# Patient Record
Sex: Female | Born: 1945
Health system: Southern US, Community
[De-identification: ages and names within clinical notes are randomized; demographics above are authoritative.]

## PROBLEM LIST (undated history)

## (undated) DIAGNOSIS — I10 Essential (primary) hypertension: Secondary | ICD-10-CM

## (undated) DIAGNOSIS — S82899A Other fracture of unspecified lower leg, initial encounter for closed fracture: Secondary | ICD-10-CM

## (undated) DIAGNOSIS — E785 Hyperlipidemia, unspecified: Secondary | ICD-10-CM

## (undated) DIAGNOSIS — E079 Disorder of thyroid, unspecified: Secondary | ICD-10-CM

## (undated) DIAGNOSIS — D219 Benign neoplasm of connective and other soft tissue, unspecified: Secondary | ICD-10-CM

## (undated) DIAGNOSIS — M8580 Other specified disorders of bone density and structure, unspecified site: Secondary | ICD-10-CM

## (undated) DIAGNOSIS — Z8781 Personal history of (healed) traumatic fracture: Secondary | ICD-10-CM

## (undated) DIAGNOSIS — E119 Type 2 diabetes mellitus without complications: Secondary | ICD-10-CM

## (undated) HISTORY — DX: Benign neoplasm of connective and other soft tissue, unspecified: D21.9

## (undated) HISTORY — DX: Other specified disorders of bone density and structure, unspecified site: M85.80

## (undated) HISTORY — PX: TONSILLECTOMY: SHX5217

## (undated) HISTORY — PX: KNEE SURGERY: SHX244

## (undated) HISTORY — DX: Hyperlipidemia, unspecified: E78.5

## (undated) HISTORY — PX: CHOLECYSTECTOMY: SHX55

## (undated) HISTORY — DX: Personal history of (healed) traumatic fracture: Z87.81

## (undated) HISTORY — PX: ABDOMINAL HYSTERECTOMY: SHX81

## (undated) HISTORY — DX: Other fracture of unspecified lower leg, initial encounter for closed fracture: S82.899A

## (undated) HISTORY — DX: Type 2 diabetes mellitus without complications: E11.9

## (undated) HISTORY — PX: BREAST LUMPECTOMY: SHX2

## (undated) HISTORY — PX: ANKLE SURGERY: SHX546

---

## 1985-11-09 HISTORY — PX: BREAST BIOPSY: SHX20

## 1998-05-28 ENCOUNTER — Other Ambulatory Visit: Admission: RE | Admit: 1998-05-28 | Discharge: 1998-05-28 | Payer: Self-pay | Admitting: Family Medicine

## 1998-09-11 ENCOUNTER — Other Ambulatory Visit: Admission: RE | Admit: 1998-09-11 | Discharge: 1998-09-11 | Payer: Self-pay | Admitting: Family Medicine

## 1998-12-12 ENCOUNTER — Other Ambulatory Visit: Admission: RE | Admit: 1998-12-12 | Discharge: 1998-12-12 | Payer: Self-pay | Admitting: Family Medicine

## 1999-04-22 ENCOUNTER — Other Ambulatory Visit: Admission: RE | Admit: 1999-04-22 | Discharge: 1999-04-22 | Payer: Self-pay | Admitting: Family Medicine

## 1999-08-21 ENCOUNTER — Other Ambulatory Visit: Admission: RE | Admit: 1999-08-21 | Discharge: 1999-08-21 | Payer: Self-pay | Admitting: Family Medicine

## 2000-03-24 ENCOUNTER — Other Ambulatory Visit: Admission: RE | Admit: 2000-03-24 | Discharge: 2000-03-24 | Payer: Self-pay | Admitting: Family Medicine

## 2000-04-22 ENCOUNTER — Encounter (INDEPENDENT_AMBULATORY_CARE_PROVIDER_SITE_OTHER): Payer: Self-pay | Admitting: Specialist

## 2000-04-22 ENCOUNTER — Ambulatory Visit (HOSPITAL_COMMUNITY): Admission: RE | Admit: 2000-04-22 | Discharge: 2000-04-22 | Payer: Self-pay | Admitting: Surgery

## 2000-12-22 ENCOUNTER — Other Ambulatory Visit: Admission: RE | Admit: 2000-12-22 | Discharge: 2000-12-22 | Payer: Self-pay | Admitting: Family Medicine

## 2001-10-04 ENCOUNTER — Other Ambulatory Visit: Admission: RE | Admit: 2001-10-04 | Discharge: 2001-10-04 | Payer: Self-pay | Admitting: Family Medicine

## 2002-04-07 ENCOUNTER — Other Ambulatory Visit: Admission: RE | Admit: 2002-04-07 | Discharge: 2002-04-07 | Payer: Self-pay | Admitting: Family Medicine

## 2002-09-28 ENCOUNTER — Other Ambulatory Visit: Admission: RE | Admit: 2002-09-28 | Discharge: 2002-09-28 | Payer: Self-pay | Admitting: Family Medicine

## 2004-11-20 ENCOUNTER — Other Ambulatory Visit: Admission: RE | Admit: 2004-11-20 | Discharge: 2004-11-20 | Payer: Self-pay | Admitting: Family Medicine

## 2005-12-03 ENCOUNTER — Other Ambulatory Visit: Admission: RE | Admit: 2005-12-03 | Discharge: 2005-12-03 | Payer: Self-pay | Admitting: Family Medicine

## 2010-06-03 ENCOUNTER — Encounter: Admission: RE | Admit: 2010-06-03 | Discharge: 2010-08-07 | Payer: Self-pay | Admitting: Family Medicine

## 2011-03-31 ENCOUNTER — Ambulatory Visit: Payer: Medicare Other | Attending: Orthopedic Surgery | Admitting: Physical Therapy

## 2011-03-31 DIAGNOSIS — M25676 Stiffness of unspecified foot, not elsewhere classified: Secondary | ICD-10-CM | POA: Insufficient documentation

## 2011-03-31 DIAGNOSIS — IMO0001 Reserved for inherently not codable concepts without codable children: Secondary | ICD-10-CM | POA: Insufficient documentation

## 2011-03-31 DIAGNOSIS — R262 Difficulty in walking, not elsewhere classified: Secondary | ICD-10-CM | POA: Insufficient documentation

## 2011-03-31 DIAGNOSIS — R5381 Other malaise: Secondary | ICD-10-CM | POA: Insufficient documentation

## 2011-03-31 DIAGNOSIS — M25579 Pain in unspecified ankle and joints of unspecified foot: Secondary | ICD-10-CM | POA: Insufficient documentation

## 2011-03-31 DIAGNOSIS — M25673 Stiffness of unspecified ankle, not elsewhere classified: Secondary | ICD-10-CM | POA: Insufficient documentation

## 2011-04-02 ENCOUNTER — Ambulatory Visit: Payer: Medicare Other | Admitting: Physical Therapy

## 2011-04-07 ENCOUNTER — Encounter: Payer: No Typology Code available for payment source | Admitting: Physical Therapy

## 2011-04-07 ENCOUNTER — Ambulatory Visit: Payer: Medicare Other | Admitting: Physical Therapy

## 2011-04-09 ENCOUNTER — Ambulatory Visit: Payer: Medicare Other | Admitting: Physical Therapy

## 2011-04-13 ENCOUNTER — Ambulatory Visit: Payer: Medicare Other | Attending: Orthopedic Surgery | Admitting: Physical Therapy

## 2011-04-13 DIAGNOSIS — M25673 Stiffness of unspecified ankle, not elsewhere classified: Secondary | ICD-10-CM | POA: Insufficient documentation

## 2011-04-13 DIAGNOSIS — R262 Difficulty in walking, not elsewhere classified: Secondary | ICD-10-CM | POA: Insufficient documentation

## 2011-04-13 DIAGNOSIS — M25676 Stiffness of unspecified foot, not elsewhere classified: Secondary | ICD-10-CM | POA: Insufficient documentation

## 2011-04-13 DIAGNOSIS — M25579 Pain in unspecified ankle and joints of unspecified foot: Secondary | ICD-10-CM | POA: Insufficient documentation

## 2011-04-13 DIAGNOSIS — IMO0001 Reserved for inherently not codable concepts without codable children: Secondary | ICD-10-CM | POA: Insufficient documentation

## 2011-04-13 DIAGNOSIS — R5381 Other malaise: Secondary | ICD-10-CM | POA: Insufficient documentation

## 2011-04-15 ENCOUNTER — Ambulatory Visit: Payer: Medicare Other | Admitting: Physical Therapy

## 2011-04-17 ENCOUNTER — Ambulatory Visit: Payer: Medicare Other | Admitting: Physical Therapy

## 2011-04-20 ENCOUNTER — Ambulatory Visit: Payer: Medicare Other | Admitting: Physical Therapy

## 2011-04-22 ENCOUNTER — Ambulatory Visit: Payer: Medicare Other | Admitting: Physical Therapy

## 2011-04-27 ENCOUNTER — Ambulatory Visit: Payer: Medicare Other | Admitting: Physical Therapy

## 2011-04-29 ENCOUNTER — Encounter: Payer: No Typology Code available for payment source | Admitting: Physical Therapy

## 2011-04-29 ENCOUNTER — Ambulatory Visit: Payer: Medicare Other | Admitting: Physical Therapy

## 2011-05-01 ENCOUNTER — Ambulatory Visit: Payer: Medicare Other | Admitting: Physical Therapy

## 2011-05-04 ENCOUNTER — Ambulatory Visit: Payer: Medicare Other | Admitting: Physical Therapy

## 2011-05-06 ENCOUNTER — Ambulatory Visit: Payer: Medicare Other | Admitting: Physical Therapy

## 2011-05-08 ENCOUNTER — Ambulatory Visit: Payer: Medicare Other | Admitting: Physical Therapy

## 2011-05-18 ENCOUNTER — Ambulatory Visit: Payer: Medicare Other | Attending: Orthopedic Surgery | Admitting: Physical Therapy

## 2011-05-18 DIAGNOSIS — IMO0001 Reserved for inherently not codable concepts without codable children: Secondary | ICD-10-CM | POA: Insufficient documentation

## 2011-05-18 DIAGNOSIS — M25579 Pain in unspecified ankle and joints of unspecified foot: Secondary | ICD-10-CM | POA: Insufficient documentation

## 2011-05-18 DIAGNOSIS — M25673 Stiffness of unspecified ankle, not elsewhere classified: Secondary | ICD-10-CM | POA: Insufficient documentation

## 2011-05-18 DIAGNOSIS — R262 Difficulty in walking, not elsewhere classified: Secondary | ICD-10-CM | POA: Insufficient documentation

## 2011-05-18 DIAGNOSIS — R5381 Other malaise: Secondary | ICD-10-CM | POA: Insufficient documentation

## 2011-05-18 DIAGNOSIS — M25676 Stiffness of unspecified foot, not elsewhere classified: Secondary | ICD-10-CM | POA: Insufficient documentation

## 2011-05-20 ENCOUNTER — Ambulatory Visit: Payer: Medicare Other | Admitting: Physical Therapy

## 2011-05-22 ENCOUNTER — Ambulatory Visit: Payer: Medicare Other | Admitting: Physical Therapy

## 2011-05-25 ENCOUNTER — Ambulatory Visit: Payer: Medicare Other | Admitting: Physical Therapy

## 2011-05-27 ENCOUNTER — Ambulatory Visit: Payer: Medicare Other | Admitting: Physical Therapy

## 2011-05-29 ENCOUNTER — Ambulatory Visit: Payer: Medicare Other | Admitting: Physical Therapy

## 2011-06-01 ENCOUNTER — Ambulatory Visit: Payer: Medicare Other | Admitting: Physical Therapy

## 2011-06-03 ENCOUNTER — Ambulatory Visit: Payer: Medicare Other | Admitting: Physical Therapy

## 2011-06-05 ENCOUNTER — Ambulatory Visit: Payer: Medicare Other | Admitting: *Deleted

## 2011-06-08 ENCOUNTER — Ambulatory Visit: Payer: Medicare Other | Admitting: Physical Therapy

## 2011-06-10 ENCOUNTER — Ambulatory Visit: Payer: Medicare Other | Attending: Orthopedic Surgery | Admitting: Physical Therapy

## 2011-06-10 DIAGNOSIS — M25579 Pain in unspecified ankle and joints of unspecified foot: Secondary | ICD-10-CM | POA: Insufficient documentation

## 2011-06-10 DIAGNOSIS — M25673 Stiffness of unspecified ankle, not elsewhere classified: Secondary | ICD-10-CM | POA: Insufficient documentation

## 2011-06-10 DIAGNOSIS — R262 Difficulty in walking, not elsewhere classified: Secondary | ICD-10-CM | POA: Insufficient documentation

## 2011-06-10 DIAGNOSIS — R5381 Other malaise: Secondary | ICD-10-CM | POA: Insufficient documentation

## 2011-06-10 DIAGNOSIS — M25676 Stiffness of unspecified foot, not elsewhere classified: Secondary | ICD-10-CM | POA: Insufficient documentation

## 2011-06-10 DIAGNOSIS — IMO0001 Reserved for inherently not codable concepts without codable children: Secondary | ICD-10-CM | POA: Insufficient documentation

## 2011-06-12 ENCOUNTER — Ambulatory Visit: Payer: Medicare Other | Admitting: Physical Therapy

## 2011-06-15 ENCOUNTER — Ambulatory Visit: Payer: Medicare Other | Admitting: Physical Therapy

## 2011-06-17 ENCOUNTER — Ambulatory Visit: Payer: Medicare Other | Admitting: Physical Therapy

## 2011-06-19 ENCOUNTER — Ambulatory Visit: Payer: Medicare Other | Admitting: Physical Therapy

## 2011-06-22 ENCOUNTER — Ambulatory Visit: Payer: Medicare Other | Admitting: Physical Therapy

## 2011-06-24 ENCOUNTER — Ambulatory Visit: Payer: Medicare Other | Admitting: Physical Therapy

## 2011-06-26 ENCOUNTER — Ambulatory Visit: Payer: Medicare Other | Admitting: Physical Therapy

## 2011-06-29 ENCOUNTER — Ambulatory Visit: Payer: Medicare Other | Admitting: Physical Therapy

## 2011-07-01 ENCOUNTER — Encounter: Payer: No Typology Code available for payment source | Admitting: Physical Therapy

## 2011-07-03 ENCOUNTER — Ambulatory Visit: Payer: Medicare Other | Admitting: *Deleted

## 2011-07-06 ENCOUNTER — Ambulatory Visit: Payer: Medicare Other | Admitting: Physical Therapy

## 2011-07-09 ENCOUNTER — Ambulatory Visit: Payer: Medicare Other | Admitting: Physical Therapy

## 2011-07-14 ENCOUNTER — Ambulatory Visit: Payer: Medicare Other | Attending: Orthopedic Surgery | Admitting: Physical Therapy

## 2011-07-14 DIAGNOSIS — M25673 Stiffness of unspecified ankle, not elsewhere classified: Secondary | ICD-10-CM | POA: Insufficient documentation

## 2011-07-14 DIAGNOSIS — M25676 Stiffness of unspecified foot, not elsewhere classified: Secondary | ICD-10-CM | POA: Insufficient documentation

## 2011-07-14 DIAGNOSIS — R5381 Other malaise: Secondary | ICD-10-CM | POA: Insufficient documentation

## 2011-07-14 DIAGNOSIS — R262 Difficulty in walking, not elsewhere classified: Secondary | ICD-10-CM | POA: Insufficient documentation

## 2011-07-14 DIAGNOSIS — IMO0001 Reserved for inherently not codable concepts without codable children: Secondary | ICD-10-CM | POA: Insufficient documentation

## 2011-07-14 DIAGNOSIS — M25579 Pain in unspecified ankle and joints of unspecified foot: Secondary | ICD-10-CM | POA: Insufficient documentation

## 2011-07-16 ENCOUNTER — Ambulatory Visit: Payer: Medicare Other | Admitting: Physical Therapy

## 2011-07-20 ENCOUNTER — Ambulatory Visit: Payer: Medicare Other | Admitting: Physical Therapy

## 2012-01-22 ENCOUNTER — Inpatient Hospital Stay (HOSPITAL_COMMUNITY): Payer: Medicare Other | Admitting: Anesthesiology

## 2012-01-22 ENCOUNTER — Emergency Department (HOSPITAL_COMMUNITY): Payer: Medicare Other

## 2012-01-22 ENCOUNTER — Inpatient Hospital Stay (HOSPITAL_COMMUNITY)
Admission: EM | Admit: 2012-01-22 | Discharge: 2012-01-25 | DRG: 494 | Disposition: A | Discharge status: Home Health | Payer: Medicare Other | Attending: Orthopedic Surgery | Admitting: Orthopedic Surgery

## 2012-01-22 ENCOUNTER — Other Ambulatory Visit: Payer: Self-pay

## 2012-01-22 ENCOUNTER — Encounter (HOSPITAL_COMMUNITY)
Admission: EM | Disposition: A | Discharge status: Home Health | Payer: Self-pay | Source: Home / Self Care | Attending: Orthopedic Surgery

## 2012-01-22 ENCOUNTER — Inpatient Hospital Stay (HOSPITAL_COMMUNITY): Payer: Medicare Other

## 2012-01-22 ENCOUNTER — Encounter (HOSPITAL_COMMUNITY): Payer: Self-pay | Admitting: *Deleted

## 2012-01-22 ENCOUNTER — Encounter (HOSPITAL_COMMUNITY): Payer: Self-pay | Admitting: Anesthesiology

## 2012-01-22 DIAGNOSIS — E039 Hypothyroidism, unspecified: Secondary | ICD-10-CM | POA: Diagnosis not present

## 2012-01-22 DIAGNOSIS — I10 Essential (primary) hypertension: Secondary | ICD-10-CM | POA: Diagnosis present

## 2012-01-22 DIAGNOSIS — Y92009 Unspecified place in unspecified non-institutional (private) residence as the place of occurrence of the external cause: Secondary | ICD-10-CM

## 2012-01-22 DIAGNOSIS — IMO0002 Reserved for concepts with insufficient information to code with codable children: Secondary | ICD-10-CM | POA: Diagnosis not present

## 2012-01-22 DIAGNOSIS — S82002A Unspecified fracture of left patella, initial encounter for closed fracture: Secondary | ICD-10-CM

## 2012-01-22 DIAGNOSIS — S82009A Unspecified fracture of unspecified patella, initial encounter for closed fracture: Secondary | ICD-10-CM | POA: Diagnosis not present

## 2012-01-22 DIAGNOSIS — M79609 Pain in unspecified limb: Secondary | ICD-10-CM | POA: Diagnosis not present

## 2012-01-22 DIAGNOSIS — S8990XA Unspecified injury of unspecified lower leg, initial encounter: Secondary | ICD-10-CM | POA: Diagnosis not present

## 2012-01-22 DIAGNOSIS — M7989 Other specified soft tissue disorders: Secondary | ICD-10-CM | POA: Diagnosis not present

## 2012-01-22 DIAGNOSIS — T148XXA Other injury of unspecified body region, initial encounter: Secondary | ICD-10-CM | POA: Diagnosis not present

## 2012-01-22 DIAGNOSIS — Z01811 Encounter for preprocedural respiratory examination: Secondary | ICD-10-CM | POA: Diagnosis not present

## 2012-01-22 DIAGNOSIS — S82009B Unspecified fracture of unspecified patella, initial encounter for open fracture type I or II: Secondary | ICD-10-CM | POA: Diagnosis not present

## 2012-01-22 DIAGNOSIS — W010XXA Fall on same level from slipping, tripping and stumbling without subsequent striking against object, initial encounter: Secondary | ICD-10-CM | POA: Diagnosis present

## 2012-01-22 DIAGNOSIS — M25569 Pain in unspecified knee: Secondary | ICD-10-CM | POA: Diagnosis not present

## 2012-01-22 HISTORY — DX: Disorder of thyroid, unspecified: E07.9

## 2012-01-22 HISTORY — DX: Essential (primary) hypertension: I10

## 2012-01-22 HISTORY — PX: ORIF PATELLA: SHX5033

## 2012-01-22 LAB — BASIC METABOLIC PANEL
Calcium: 9.4 mg/dL (ref 8.4–10.5)
GFR calc non Af Amer: 85 mL/min — ABNORMAL LOW (ref >90)
Potassium: 3.4 mEq/L — ABNORMAL LOW (ref 3.5–5.1)
Sodium: 141 mEq/L (ref 135–145)

## 2012-01-22 LAB — CBC
Hemoglobin: 13.4 g/dL (ref 12.0–15.0)
MCH: 30.5 pg (ref 26.0–34.0)
MCHC: 33.4 g/dL (ref 30.0–36.0)
Platelets: 282 10*3/uL (ref 150–400)
RBC: 4.39 MIL/uL (ref 3.87–5.11)

## 2012-01-22 LAB — MRSA PCR SCREENING: MRSA by PCR: NEGATIVE

## 2012-01-22 SURGERY — OPEN REDUCTION INTERNAL FIXATION (ORIF) PATELLA
Anesthesia: General | Site: Knee | Laterality: Left | Wound class: Clean

## 2012-01-22 MED ORDER — METHOCARBAMOL 500 MG PO TABS
500.0000 mg | ORAL_TABLET | Freq: Four times a day (QID) | ORAL | Status: DC | PRN
  Administered 2012-01-23 – 2012-01-25 (×3): 500 mg via ORAL
  Filled 2012-01-22 (×3): qty 1

## 2012-01-22 MED ORDER — POTASSIUM CHLORIDE IN NACL 20-0.9 MEQ/L-% IV SOLN
INTRAVENOUS | Status: DC
  Administered 2012-01-22: 23:00:00 via INTRAVENOUS
  Filled 2012-01-22 (×8): qty 1000

## 2012-01-22 MED ORDER — METHOCARBAMOL 500 MG PO TABS: 500.0000 mg | ORAL_TABLET | Freq: Four times a day (QID) | ORAL | Status: AC

## 2012-01-22 MED ORDER — METHOCARBAMOL 100 MG/ML IJ SOLN
500.0000 mg | Freq: Four times a day (QID) | INTRAVENOUS | Status: DC | PRN
  Administered 2012-01-23: 500 mg via INTRAVENOUS
  Filled 2012-01-22 (×2): qty 5

## 2012-01-22 MED ORDER — WARFARIN VIDEO
Freq: Once | Status: AC
  Administered 2012-01-23: 12:00:00

## 2012-01-22 MED ORDER — LACTATED RINGERS IV SOLN
INTRAVENOUS | Status: DC | PRN
  Administered 2012-01-22: 20:00:00 via INTRAVENOUS

## 2012-01-22 MED ORDER — IBUPROFEN 200 MG PO TABS
400.0000 mg | ORAL_TABLET | Freq: Once | ORAL | Status: AC
  Administered 2012-01-22: 400 mg via ORAL
  Filled 2012-01-22: qty 2

## 2012-01-22 MED ORDER — OXYCODONE-ACETAMINOPHEN 5-325 MG PO TABS
2.0000 | ORAL_TABLET | Freq: Once | ORAL | Status: AC
  Administered 2012-01-22: 2 via ORAL
  Filled 2012-01-22: qty 2

## 2012-01-22 MED ORDER — FENTANYL CITRATE 0.05 MG/ML IJ SOLN
INTRAMUSCULAR | Status: DC | PRN
  Administered 2012-01-22 (×3): 50 ug via INTRAVENOUS
  Administered 2012-01-22: 100 ug via INTRAVENOUS

## 2012-01-22 MED ORDER — KCL IN DEXTROSE-NACL 20-5-0.9 MEQ/L-%-% IV SOLN
INTRAVENOUS | Status: DC
  Filled 2012-01-22 (×3): qty 1000

## 2012-01-22 MED ORDER — VITAMIN D3 25 MCG (1000 UNIT) PO TABS
2000.0000 [IU] | ORAL_TABLET | Freq: Every morning | ORAL | Status: DC
  Administered 2012-01-23 – 2012-01-25 (×3): 2000 [IU] via ORAL
  Filled 2012-01-22 (×3): qty 2

## 2012-01-22 MED ORDER — ONDANSETRON HCL 4 MG/2ML IJ SOLN
INTRAMUSCULAR | Status: DC | PRN
  Administered 2012-01-22: 4 mg via INTRAVENOUS

## 2012-01-22 MED ORDER — LACTATED RINGERS IV SOLN: INTRAVENOUS | Status: DC

## 2012-01-22 MED ORDER — MIDAZOLAM HCL 5 MG/5ML IJ SOLN
INTRAMUSCULAR | Status: DC | PRN
  Administered 2012-01-22: 2 mg via INTRAVENOUS

## 2012-01-22 MED ORDER — CEFAZOLIN SODIUM-DEXTROSE 2-3 GM-% IV SOLR
2.0000 g | INTRAVENOUS | Status: AC
  Administered 2012-01-22: 2 g via INTRAVENOUS

## 2012-01-22 MED ORDER — OXYCODONE HCL 5 MG PO TABS
5.0000 mg | ORAL_TABLET | ORAL | Status: DC | PRN
  Administered 2012-01-23 (×2): 10 mg via ORAL
  Administered 2012-01-23: 5 mg via ORAL
  Administered 2012-01-24 – 2012-01-25 (×5): 10 mg via ORAL
  Filled 2012-01-22 (×8): qty 2

## 2012-01-22 MED ORDER — OXYCODONE-ACETAMINOPHEN 5-325 MG PO TABS: 1.0000 | ORAL_TABLET | ORAL | Status: DC | PRN

## 2012-01-22 MED ORDER — HYDROMORPHONE HCL PF 1 MG/ML IJ SOLN
0.2500 mg | INTRAMUSCULAR | Status: DC | PRN
  Administered 2012-01-22 – 2012-01-23 (×5): 0.5 mg via INTRAVENOUS
  Filled 2012-01-22 (×3): qty 1

## 2012-01-22 MED ORDER — ACETAMINOPHEN 10 MG/ML IV SOLN
INTRAVENOUS | Status: DC | PRN
  Administered 2012-01-22: 1000 mg via INTRAVENOUS

## 2012-01-22 MED ORDER — CEFAZOLIN SODIUM 1-5 GM-% IV SOLN
INTRAVENOUS | Status: AC
  Filled 2012-01-22: qty 100

## 2012-01-22 MED ORDER — METOCLOPRAMIDE HCL 5 MG/ML IJ SOLN: 5.0000 mg | Freq: Three times a day (TID) | INTRAMUSCULAR | Status: DC | PRN

## 2012-01-22 MED ORDER — MORPHINE SULFATE 2 MG/ML IJ SOLN
1.0000 mg | INTRAMUSCULAR | Status: DC | PRN
  Administered 2012-01-23: 1 mg via INTRAVENOUS
  Filled 2012-01-22: qty 1

## 2012-01-22 MED ORDER — WARFARIN - PHARMACIST DOSING INPATIENT: Freq: Every day | Status: DC

## 2012-01-22 MED ORDER — LEVOTHYROXINE SODIUM 125 MCG PO TABS
125.0000 ug | ORAL_TABLET | Freq: Every day | ORAL | Status: DC
  Administered 2012-01-23 – 2012-01-25 (×3): 125 ug via ORAL
  Filled 2012-01-22 (×3): qty 1

## 2012-01-22 MED ORDER — POTASSIUM CHLORIDE IN NACL 20-0.9 MEQ/L-% IV SOLN
INTRAVENOUS | Status: AC
  Filled 2012-01-22: qty 1000

## 2012-01-22 MED ORDER — OXYCODONE-ACETAMINOPHEN 5-325 MG PO TABS: 1.0000 | ORAL_TABLET | ORAL | Status: AC | PRN

## 2012-01-22 MED ORDER — ASPIRIN 325 MG PO TABS
325.0000 mg | ORAL_TABLET | Freq: Every day | ORAL | Status: DC
  Administered 2012-01-23 – 2012-01-25 (×3): 325 mg via ORAL
  Filled 2012-01-22 (×3): qty 1

## 2012-01-22 MED ORDER — WARFARIN SODIUM 5 MG PO TABS: 5.0000 mg | ORAL_TABLET | Freq: Every day | ORAL | Status: AC

## 2012-01-22 MED ORDER — ACETAMINOPHEN 10 MG/ML IV SOLN
INTRAVENOUS | Status: AC
  Filled 2012-01-22: qty 100

## 2012-01-22 MED ORDER — HYDROCHLOROTHIAZIDE 25 MG PO TABS
25.0000 mg | ORAL_TABLET | Freq: Every day | ORAL | Status: DC
Start: 2012-01-23 — End: 2012-01-25
  Administered 2012-01-23 – 2012-01-25 (×3): 25 mg via ORAL
  Filled 2012-01-22 (×3): qty 1

## 2012-01-22 MED ORDER — LIDOCAINE HCL (CARDIAC) 20 MG/ML IV SOLN
INTRAVENOUS | Status: DC | PRN
  Administered 2012-01-22: 75 mg via INTRAVENOUS

## 2012-01-22 MED ORDER — ONDANSETRON HCL 4 MG PO TABS
4.0000 mg | ORAL_TABLET | Freq: Four times a day (QID) | ORAL | Status: DC | PRN
  Administered 2012-01-25: 4 mg via ORAL
  Filled 2012-01-22: qty 1

## 2012-01-22 MED ORDER — COUMADIN BOOK
Freq: Once | Status: AC
  Administered 2012-01-23: 14:00:00
  Filled 2012-01-22 (×2): qty 1

## 2012-01-22 MED ORDER — HYDROMORPHONE HCL PF 1 MG/ML IJ SOLN
INTRAMUSCULAR | Status: AC
  Administered 2012-01-23: 0.5 mg via INTRAVENOUS
  Filled 2012-01-22: qty 1

## 2012-01-22 MED ORDER — PROPOFOL 10 MG/ML IV BOLUS
INTRAVENOUS | Status: DC | PRN
  Administered 2012-01-22: 200 mg via INTRAVENOUS

## 2012-01-22 MED ORDER — METOCLOPRAMIDE HCL 10 MG PO TABS: 5.0000 mg | ORAL_TABLET | Freq: Three times a day (TID) | ORAL | Status: DC | PRN

## 2012-01-22 MED ORDER — CEFAZOLIN SODIUM 1-5 GM-% IV SOLN
1.0000 g | Freq: Three times a day (TID) | INTRAVENOUS | Status: AC
  Administered 2012-01-23 (×3): 1 g via INTRAVENOUS
  Filled 2012-01-22 (×4): qty 50

## 2012-01-22 MED ORDER — ONDANSETRON HCL 4 MG/2ML IJ SOLN: 4.0000 mg | Freq: Four times a day (QID) | INTRAMUSCULAR | Status: DC | PRN

## 2012-01-22 MED ORDER — DEXAMETHASONE SODIUM PHOSPHATE 10 MG/ML IJ SOLN
INTRAMUSCULAR | Status: DC | PRN
  Administered 2012-01-22: 10 mg via INTRAVENOUS

## 2012-01-22 SURGICAL SUPPLY — 48 items
1.25MM GUIDE PIN ×3 IMPLANT
BANDAGE ELASTIC 4 VELCRO ST LF (GAUZE/BANDAGES/DRESSINGS) ×2 IMPLANT
BANDAGE ELASTIC 6 VELCRO ST LF (GAUZE/BANDAGES/DRESSINGS) ×2 IMPLANT
BIT DRILL CANN 2.7X625 NONSTRL (BIT) ×1 IMPLANT
BLADE SURG ROTATE 9660 (MISCELLANEOUS) ×2 IMPLANT
CLOTH BEACON ORANGE TIMEOUT ST (SAFETY) ×2 IMPLANT
COVER SURGICAL LIGHT HANDLE (MISCELLANEOUS) ×2 IMPLANT
CUFF TOURN SGL QUICK 34 (TOURNIQUET CUFF) ×2
CUFF TOURNIQUET SINGLE 44IN (TOURNIQUET CUFF) IMPLANT
CUFF TRNQT CYL 34X4X40X1 (TOURNIQUET CUFF) IMPLANT
DECANTER SPIKE VIAL GLASS SM (MISCELLANEOUS) IMPLANT
DRAPE C-ARM 42X72 X-RAY (DRAPES) ×2 IMPLANT
DRSG ADAPTIC 3X8 NADH LF (GAUZE/BANDAGES/DRESSINGS) ×2 IMPLANT
DRSG EMULSION OIL 3X3 NADH (GAUZE/BANDAGES/DRESSINGS) ×2 IMPLANT
DRSG PAD ABDOMINAL 8X10 ST (GAUZE/BANDAGES/DRESSINGS) ×2 IMPLANT
ELECT REM PT RETURN 9FT ADLT (ELECTROSURGICAL) ×2
ELECTRODE REM PT RTRN 9FT ADLT (ELECTROSURGICAL) ×1 IMPLANT
GAUZE XEROFORM 1X8 LF (GAUZE/BANDAGES/DRESSINGS) ×1 IMPLANT
GLOVE BIO SURGEON STRL SZ8 (GLOVE) ×4 IMPLANT
GLOVE BIOGEL PI IND STRL 8 (GLOVE) ×1 IMPLANT
GLOVE BIOGEL PI INDICATOR 8 (GLOVE) ×1
GOWN PREVENTION PLUS LG XLONG (DISPOSABLE) IMPLANT
GOWN PREVENTION PLUS XLARGE (GOWN DISPOSABLE) ×4 IMPLANT
IMMOBILIZER KNEE 22 UNIV (SOFTGOODS) ×1 IMPLANT
KIT BASIN OR (CUSTOM PROCEDURE TRAY) ×2 IMPLANT
MANIFOLD NEPTUNE II (INSTRUMENTS) ×2 IMPLANT
NEEDLE 22X1 1/2 (OR ONLY) (NEEDLE) IMPLANT
NS IRRIG 1000ML POUR BTL (IV SOLUTION) ×2 IMPLANT
PACK LOWER EXTREMITY WL (CUSTOM PROCEDURE TRAY) ×1 IMPLANT
PAD CAST 4YDX4 CTTN HI CHSV (CAST SUPPLIES) ×2 IMPLANT
PADDING CAST COTTON 4X4 STRL (CAST SUPPLIES) ×4
PADDING CAST COTTON 6X4 STRL (CAST SUPPLIES) ×1 IMPLANT
PASSER SUT SWANSON 36MM LOOP (INSTRUMENTS) ×1 IMPLANT
SCREW CANN P.T. 40MM (Screw) ×2 IMPLANT
SPONGE GAUZE 4X4 12PLY (GAUZE/BANDAGES/DRESSINGS) ×1 IMPLANT
SPONGE LAP 4X18 X RAY DECT (DISPOSABLE) ×4 IMPLANT
STAPLER VISISTAT 35W (STAPLE) ×2 IMPLANT
SUCTION FRAZIER TIP 10 FR DISP (SUCTIONS) ×2 IMPLANT
SUT VIC AB 0 CT1 36 (SUTURE) ×1 IMPLANT
SUT VIC AB 2-0 CT1 27 (SUTURE) ×10
SUT VIC AB 2-0 CT1 TAPERPNT 27 (SUTURE) ×2 IMPLANT
SUT WIRE 4-0 14IN SS (SUTURE) ×1 IMPLANT
SYR CONTROL 10ML LL (SYRINGE) IMPLANT
TOWEL OR 17X26 10 PK STRL BLUE (TOWEL DISPOSABLE) ×2 IMPLANT
TUBE CONNECTING 12X1/4 (SUCTIONS) ×2 IMPLANT
UNDERPAD 30X30 INCONTINENT (UNDERPADS AND DIAPERS) ×2 IMPLANT
WATER STERILE IRR 1000ML POUR (IV SOLUTION) ×2 IMPLANT
YANKAUER SUCT BULB TIP NO VENT (SUCTIONS) IMPLANT

## 2012-01-22 NOTE — Anesthesia Preprocedure Evaluation (Addendum)
Anesthesia Evaluation  Patient identified by MRN, date of birth, ID band Patient awake    Reviewed: Allergy & Precautions, H&P , NPO status , Patient's Chart, lab work & pertinent test results  Airway Mallampati: II TM Distance: >3 FB Neck ROM: full    Dental  (+) Chipped and Dental Advisory Given,    Pulmonary neg pulmonary ROS,  breath sounds clear to auscultation  Pulmonary exam normal       Cardiovascular Exercise Tolerance: Good hypertension, Pt. on medications Rhythm:regular Rate:Normal     Neuro/Psych negative neurological ROS  negative psych ROS   GI/Hepatic negative GI ROS, Neg liver ROS,   Endo/Other  negative endocrine ROSHypothyroidism   Renal/GU negative Renal ROS  negative genitourinary   Musculoskeletal   Abdominal   Peds  Hematology negative hematology ROS (+)   Anesthesia Other Findings   Reproductive/Obstetrics negative OB ROS                         Anesthesia Physical Anesthesia Plan  ASA: II  Anesthesia Plan: General   Post-op Pain Management:    Induction: Intravenous  Airway Management Planned: LMA  Additional Equipment:   Intra-op Plan:   Post-operative Plan:   Informed Consent: I have reviewed the patients History and Physical, chart, labs and discussed the procedure including the risks, benefits and alternatives for the proposed anesthesia with the patient or authorized representative who has indicated his/her understanding and acceptance.   Dental Advisory Given  Plan Discussed with: CRNA and Surgeon  Anesthesia Plan Comments:         Anesthesia Quick Evaluation

## 2012-01-22 NOTE — Plan of Care (Signed)
Problem: Diagnosis - Type of Surgery Goal: General Surgical Patient Education (See Patient Education module for education specifics) Outcome: Progressing orif left patella

## 2012-01-22 NOTE — Anesthesia Postprocedure Evaluation (Signed)
  Anesthesia Post-op Note  Patient: Vanessa Hubbard  Procedure(s) Performed: Procedure(s) (LRB): OPEN REDUCTION INTERNAL (ORIF) FIXATION PATELLA (Left)  Patient Location: PACU  Anesthesia Type: General  Level of Consciousness: awake and alert   Airway and Oxygen Therapy: Patient Spontanous Breathing  Post-op Pain: mild  Post-op Assessment: Post-op Vital signs reviewed, Patient's Cardiovascular Status Stable, Respiratory Function Stable, Patent Airway and No signs of Nausea or vomiting  Post-op Vital Signs: stable  Complications: No apparent anesthesia complications

## 2012-01-22 NOTE — Progress Notes (Addendum)
ANTICOAGULATION CONSULT NOTE - Initial Consult  Pharmacy Consult for Warfarin Indication: VTE prophylaxis s/p ORIF fractured patella  No Known Allergies  Patient Measurements: Height: 5\' 3"  (160 cm) Weight: 173 lb (78.472 kg) IBW/kg (Calculated) : 52.4   Vital Signs: Temp: 98.3 F (36.8 C) (03/15 1946) Temp src: Oral (03/15 1946) BP: 135/78 mmHg (03/15 1946) Pulse Rate: 81  (03/15 1946)  Labs:  Basename 01/22/12 1827  HGB 13.4  HCT 40.1  PLT 282  APTT --  LABPROT --  INR --  HEPARINUNFRC --  CREATININE 0.78  CKTOTAL --  CKMB --  TROPONINI --   Estimated Creatinine Clearance: 67.7 ml/min (by C-G formula based on Cr of 0.78).  Medical History: Past Medical History  Diagnosis Date  . Hypertension   . Thyroid disease     Medications:  Scheduled:    .  ceFAZolin (ANCEF) IV  2 g Intravenous 60 min Pre-Op  . ibuprofen  400 mg Oral Once  . oxyCODONE-acetaminophen  2 tablet Oral Once    Assessment:  66 yo F admit with fractured left patella due to falling  s/p ORIF fractured patella 3/15, notes indicate hemodynamically stable post-op  Baseline coags pending  Goal of Therapy:  INR 2-3   Plan:   Warfarin dosing per pharmacy  Daily INR, CBC  Warfarin education, book and video   Lynann Beaver PharmD, BCPS Pager [phone] 01/22/2012 10:23 PM  ADDENDUM : Baseline INR drawn @ 00:05 on 01/23/12 = 1.01  Plan : Coumadin 5mg  po x 1 tonight.  Terrilee Files, PharmD 01/23/12 00:58

## 2012-01-22 NOTE — H&P (Signed)
Vanessa Hubbard is an 66 y.o. female.   Chief Complaint: left knee pain HPI: Vanessa Hubbard is a 66 year old female with left knee pain. Patient fell directly onto the left knee today. She reports inability to weight-bear and has weakness with leg extension. Notably the patient has had left ankle fracture fixed a year ago and right patella fracture fixed 17 years ago. The patient denies any other orthopedic complaints or loss of consciousness from the time of the accident. There is no history of pulmonary embolism or deep vein thrombosis in the family.  Past Medical History  Diagnosis Date  . Hypertension   . Thyroid disease     Past Surgical History  Procedure Date  . Ankle surgery   . Abdominal hysterectomy     History reviewed. No pertinent family history. Social History:  reports that she has never smoked. She does not have any smokeless tobacco history on file. She reports that she does not drink alcohol or use illicit drugs.  Allergies: No Known Allergies  Medications Prior to Admission  Medication Dose Route Frequency Provider Last Rate Last Dose  . ceFAZolin (ANCEF) IVPB 2 g/50 mL premix  2 g Intravenous 60 min Pre-Op Cammy Copa, MD      . dextrose 5 % and 0.9 % NaCl with KCl 20 mEq/L infusion   Intravenous Continuous Cammy Copa, MD      . ibuprofen (ADVIL,MOTRIN) tablet 400 mg  400 mg Oral Once Raeford Razor, MD   400 mg at 01/22/12 1524  . oxyCODONE-acetaminophen (PERCOCET) 5-325 MG per tablet 1-2 tablet  1-2 tablet Oral Q4H PRN Cammy Copa, MD      . oxyCODONE-acetaminophen [facility]) 5-325 MG per tablet 2 tablet  2 tablet Oral Once Raeford Razor, MD   2 tablet at 01/22/12 1523   No current outpatient prescriptions on file as of 01/22/2012.    Results for orders placed during the hospital encounter of 01/22/12 (from the past 48 hour(s))  CBC     Status: Abnormal   Collection Time   01/22/12  6:27 PM      Component Value Range Comment   WBC 11.7 (*) 4.0 -  10.5 (K/uL)    RBC 4.39  3.87 - 5.11 (MIL/uL)    Hemoglobin 13.4  12.0 - 15.0 (g/dL)    HCT 96.0  45.4 - 09.8 (%)    MCV 91.3  78.0 - 100.0 (fL)    MCH 30.5  26.0 - 34.0 (pg)    MCHC 33.4  30.0 - 36.0 (g/dL)    RDW 11.9  14.7 - 82.9 (%)    Platelets 282  150 - 400 (K/uL)    Dg Chest 2 View  01/22/2012  *RADIOLOGY REPORT*  Clinical Data: Preoperative respiratory exam.  Patella fracture.  CHEST - 2 VIEW  Comparison: None.  Findings: The heart size and vascularity are normal and the lungs are clear.  No osseous abnormality.  IMPRESSION: Normal chest.  Original Report Authenticated By: Gwynn Burly, M.D.   Dg Knee Complete 4 Views Left  01/22/2012  *RADIOLOGY REPORT*  Clinical Data: Recent fall with knee pain  LEFT KNEE - COMPLETE 4+ VIEW  Comparison: None.  Findings:  There is a transverse fracture through the mid patella with distraction of the superior fragment cephalad by approximately 14 mm.  There is a joint effusion present.  Some loss of the medial joint space is noted. There is soft tissue swelling superficially anteriorly.  IMPRESSION: Transverse distracted fracture  of the mid patella with small effusion.  Original Report Authenticated By: Juline Patch, M.D.    Review of Systems  Constitutional: Negative.   HENT: Negative.   Eyes: Negative.   Respiratory: Negative.   Cardiovascular: Negative.   Gastrointestinal: Negative.   Musculoskeletal: Positive for joint pain.  Skin: Negative.   Neurological: Negative.   Endo/Heme/Allergies: Negative.   Psychiatric/Behavioral: Negative.     Blood pressure 122/73, pulse 83, temperature 97.9 F (36.6 C), temperature source Oral, resp. rate 16, height 5\' 3"  (1.6 m), weight 78.472 kg (173 lb), SpO2 98.00%. Physical Exam  Constitutional: She appears well-developed and well-nourished.  HENT:  Head: Normocephalic.  Eyes: Conjunctivae and EOM are normal. Pupils are equal, round, and reactive to light.  Neck: Normal range of motion. Neck  supple.  Cardiovascular: Normal rate.   Respiratory: Effort normal.  GI: Soft.   examination of the left knee demonstrates swelling but no skin abrasion. Extensor mechanism is not intact. Pedal pulses are palpable. Compartments are soft.  Assessment/Plan Impression is three-part fracture of the left patella displaced. Plan is for open reduction internal fixation of the patella. Risk and benefits are discussed with the patient including but not limited to infection nerve vessel damage incomplete healing development of arthritis in the patellofemoral joint all questions are answered this could be difficult fracture due to the fragmentation as well as do to her bone quality which may be of questionable quality. Medical decision making his coffee about decision for surgery all questions answered and indicating that on Alida Greiner  [facility] SCOTT 01/22/2012, 7:02 PM

## 2012-01-22 NOTE — ED Notes (Signed)
Pt states she was states she was in the kitchen and turn around on the her rug. Pt states she trip fell. Pt denies hitting her knee on anything. Pt denies any loc. Pt is have right knee pain. Knee noted to swollen and painful to touch

## 2012-01-22 NOTE — Op Note (Signed)
01/22/2012  10:06 PM  PATIENT:  Vanessa Hubbard  66 y.o. female  PRE-OPERATIVE DIAGNOSIS:  fractured left patella  POST-OPERATIVE DIAGNOSIS:  fractured patella left   PROCEDURE:  Procedure(s): OPEN REDUCTION INTERNAL (ORIF) FIXATION PATELLA  SURGEON:  Surgeon(s): Cammy Copa, MD  ASSISTANT: none  ANESTHESIA:   general  EBL: 50 ml       BLOOD ADMINISTERED: none  DRAINS: none   LOCAL MEDICATIONS USED:  none  SPECIMEN:  No Specimen  COUNTS:  YES  TOURNIQUET:   Total Tourniquet Time Documented: Thigh (Left) - 154 minutes  DICTATION: .Other Dictation: Dictation Number [id number] PLAN OF CARE: Admit to inpatient   PATIENT DISPOSITION:  PACU - hemodynamically stable

## 2012-01-22 NOTE — Transfer of Care (Signed)
Immediate Anesthesia Transfer of Care Note  Patient: Vanessa Hubbard  Procedure(s) Performed: Procedure(s) (LRB): OPEN REDUCTION INTERNAL (ORIF) FIXATION PATELLA (Left)  Patient Location: PACU  Anesthesia Type: General  Level of Consciousness: awake, oriented and patient cooperative  Airway & Oxygen Therapy: Patient Spontanous Breathing and Patient connected to face mask oxygen  Post-op Assessment: Report given to PACU RN and Post -op Vital signs reviewed and stable  Post vital signs: Reviewed and stable  Complications: No apparent anesthesia complications

## 2012-01-22 NOTE — Discharge Instructions (Signed)
Patellar Fracture, Adult A patellar fracture is a break in the little round bone (kneecap) that is the bump on the front of your knee. A direct blow to the knee, or fall, is usually the cause of a broken patella. Sometimes, a very hard and strong bending of the knee (like jumping events in sports) can cause a fracture. Usually the knee is tender and swollen and has pain with motion, especially trying to straighten out the leg. There may be difficulty walking or putting weight on the affected side. These fractures generally heal in about 4 to 6 weeks. DIAGNOSIS  The diagnosis is usually easily made with an exam and x-ray. TREATMENT  Treatment is dependent on the type of fracture:  If the fracture is undisplaced or only slightly displaced (this means the position of the parts is good) then, after any blood in the joint has been removed, and if you can still straighten your leg out, you can usually be treated with a splint or cast for 4 to 6 weeks. Straightening out your leg is called extension and the ability to do this is with the extensor mechanism. Every day while in treatment quadriceps exercises should be performed, or as directed by your caregivers.   If there is a stellate (comminuted) fracture of the patella (this means the patella is in multiple small pieces), the blood in the joint may be removed and if you are able to straighten your leg, you can usually be treated with a splint or cast for 4 to 6 weeks. Sometimes this type of fracture may be treated by removing the patella. You will still have a good knee without a patella. If this is done the knee still will need to be in a plaster cast or splint for the next 4 to 6 weeks.   If the fracture is a displaced transverse fracture and you cannot extend (straighten out your leg), then an operation is required to hold the bony fragments together until they heal. Again a plaster cast or splint is worn until the extension mechanism of the knee is  regained. This means the knee is healed and you can straighten out your leg again normally.   Only take over-the-counter or prescription medicines for pain, discomfort, or fever as directed by your caregiver.   Warning: Do not drive a car or operate a motor vehicle until your caregiver specifically tells you it is safe to do so.  HOME CARE INSTRUCTIONS   You may resume normal diet and activities as directed or allowed.   Keep ice packs (a bag of ice wrapped in a towel) on the knee for twenty minutes, four times per day, for the first two days.   Change dressings if necessary or as directed.   Only take over-the-counter or prescription medicines for pain, discomfort, or fever as directed by your caregiver.   Use crutches as directed and exercise leg as directed.   Keep appointments as directed.  SEEK IMMEDIATE MEDICAL CARE IF :  Redness, swelling, or increasing pain in the knee.   An unexplained oral temperature above 102 F (38.9 C) develops.  Document Released: 07/25/2003 Document Revised: 10/15/2011 Document Reviewed: 06/08/2008 [facility] Patient Information 2012 [place], [place].Patellar Fracture, Adult A patellar fracture is a break in the little round bone (kneecap) that is the bump on the front of your knee. A direct blow to the knee, or fall, is usually the cause of a broken patella. Sometimes, a very hard and strong bending of the  knee (like jumping events in sports) can cause a fracture. Usually the knee is tender and swollen and has pain with motion, especially trying to straighten out the leg. There may be difficulty walking or putting weight on the affected side. These fractures generally heal in about 4 to 6 weeks. DIAGNOSIS  The diagnosis is usually easily made with an exam and x-ray. TREATMENT  Treatment is dependent on the type of fracture:  If the fracture is undisplaced or only slightly displaced (this means the position of the parts is good) then, after any blood in  the joint has been removed, and if you can still straighten your leg out, you can usually be treated with a splint or cast for 4 to 6 weeks. Straightening out your leg is called extension and the ability to do this is with the extensor mechanism. Every day while in treatment quadriceps exercises should be performed, or as directed by your caregivers.   If there is a stellate (comminuted) fracture of the patella (this means the patella is in multiple small pieces), the blood in the joint may be removed and if you are able to straighten your leg, you can usually be treated with a splint or cast for 4 to 6 weeks. Sometimes this type of fracture may be treated by removing the patella. You will still have a good knee without a patella. If this is done the knee still will need to be in a plaster cast or splint for the next 4 to 6 weeks.   If the fracture is a displaced transverse fracture and you cannot extend (straighten out your leg), then an operation is required to hold the bony fragments together until they heal. Again a plaster cast or splint is worn until the extension mechanism of the knee is regained. This means the knee is healed and you can straighten out your leg again normally.   Only take over-the-counter or prescription medicines for pain, discomfort, or fever as directed by your caregiver.   Warning: Do not drive a car or operate a motor vehicle until your caregiver specifically tells you it is safe to do so.  HOME CARE INSTRUCTIONS   You may resume normal diet and activities as directed or allowed.   Keep ice packs (a bag of ice wrapped in a towel) on the knee for twenty minutes, four times per day, for the first two days.   Change dressings if necessary or as directed.   Only take over-the-counter or prescription medicines for pain, discomfort, or fever as directed by your caregiver.   Use crutches as directed and exercise leg as directed.   Keep appointments as directed.  SEEK  IMMEDIATE MEDICAL CARE IF :  Redness, swelling, or increasing pain in the knee.   An unexplained oral temperature above 102 F (38.9 C) develops.  Document Released: 07/25/2003 Document Revised: 10/15/2011 Document Reviewed: 06/08/2008 [facility] Patient Information 2012 [place], [place].

## 2012-01-22 NOTE — ED Provider Notes (Signed)
History    66 year old female with left knee pain. Patient mechanical fall in her kitchen and fell in her left knee. Said persistent knee pain since then. This accident happened just before arrival. Patient has not tried any leg since. Denies any acute numbness, tingling or loss of strength. She did not strike her head. Denies any headaches, neck or back pain. Patient denies use of blood thinning medication.  CSN: [id number]  Arrival date & time 01/22/12  1415   First MD Initiated Contact with Patient 01/22/12 1439      Chief Complaint  Patient presents with  . Fall  . Knee Pain    (Consider location/radiation/quality/duration/timing/severity/associated sxs/prior treatment) HPI  Past Medical History  Diagnosis Date  . Hypertension     Past Surgical History  Procedure Date  . Ankle surgery   . Abdominal hysterectomy     No family history on file.  History  Substance Use Topics  . Smoking status: Never Smoker   . Smokeless tobacco: Not on file  . Alcohol Use: No    OB History    Grav Para Term Preterm Abortions TAB SAB Ect Mult Living                  Review of Systems   Review of symptoms negative unless otherwise noted in HPI.   Allergies  Review of patient's allergies indicates not on file.  Home Medications  No current outpatient prescriptions on file.  BP 142/66  Pulse 90  Temp(Src) 98.8 F (37.1 C) (Oral)  Resp 18  Ht 5\' 3"  (1.6 m)  Wt 173 lb (78.472 kg)  BMI 30.65 kg/m2  SpO2 98%  Physical Exam  Nursing note and vitals reviewed. Constitutional: She appears well-developed and well-nourished. No distress.  HENT:  Head: Normocephalic and atraumatic.  Eyes: Conjunctivae are normal. Right eye exhibits no discharge. Left eye exhibits no discharge.  Neck: Neck supple.  Cardiovascular: Normal rate, regular rhythm and normal heart sounds.  Exam reveals no gallop and no friction rub.   No murmur heard. Pulmonary/Chest: Effort normal and breath  sounds normal. No respiratory distress.  Abdominal: Soft. She exhibits no distension. There is no tenderness.  Musculoskeletal:       Left knee with a large effusion. Diffuse tenderness. No ligamentous laxity appreciated but exam somewhat limited due to large effusion and patient's comfort. Overlying skin is intact. Neurovascularly intact distally.  Skin: Skin is warm and dry.  Psychiatric: She has a normal mood and affect. Her behavior is normal. Thought content normal.    ED Course  Procedures (including critical care time)  Labs Reviewed - No data to display Dg Knee Complete 4 Views Left  01/22/2012  *RADIOLOGY REPORT*  Clinical Data: Recent fall with knee pain  LEFT KNEE - COMPLETE 4+ VIEW  Comparison: None.  Findings:  There is a transverse fracture through the mid patella with distraction of the superior fragment cephalad by approximately 14 mm.  There is a joint effusion present.  Some loss of the medial joint space is noted. There is soft tissue swelling superficially anteriorly.  IMPRESSION: Transverse distracted fracture of the mid patella with small effusion.  Original Report Authenticated By: Juline Patch, M.D.     1. Fracture of patella, left, closed       MDM  66 year old female with left knee pain after a fall. This is mechanical fall and no history to suggest syncope. X-rays are significant for a transverse fracture of her patella with  some distraction. This is a closed injury. No evidence of neurovascular injury. Patient was placed in the knee immobilizer. Orthopedics was consulted. To OR tonight.        Raeford Razor, MD 01/22/12 [phone]

## 2012-01-23 LAB — CBC
MCH: 30.6 pg (ref 26.0–34.0)
MCV: 91.6 fL (ref 78.0–100.0)
Platelets: 285 10*3/uL (ref 150–400)
RDW: 13.2 % (ref 11.5–15.5)

## 2012-01-23 MED ORDER — METHOCARBAMOL 500 MG PO TABS: 500.0000 mg | ORAL_TABLET | Freq: Four times a day (QID) | ORAL | Status: DC | PRN

## 2012-01-23 MED ORDER — WARFARIN SODIUM 5 MG PO TABS
5.0000 mg | ORAL_TABLET | Freq: Once | ORAL | Status: AC
  Administered 2012-01-23: 5 mg via ORAL
  Filled 2012-01-23: qty 1

## 2012-01-23 NOTE — Progress Notes (Signed)
Subjective: 1 Day Post-Op Procedure(s) (LRB): OPEN REDUCTION INTERNAL (ORIF) FIXATION PATELLA (Left) Patient reports pain as moderate.    Objective: Vital signs in last 24 hours: Temp:  [97.8 F (36.6 C)-98.8 F (37.1 C)] 98.4 F (36.9 C) (03/16 0500) Pulse Rate:  [74-105] 78  (03/16 0500) Resp:  [10-18] 16  (03/16 0500) BP: (114-153)/(63-88) 114/69 mmHg (03/16 0500) SpO2:  [94 %-99 %] 95 % (03/16 0500) Weight:  [78.472 kg (173 lb)] 78.472 kg (173 lb) (03/15 1433)  Intake/Output from previous day: 03/15 0701 - 03/16 0700 In: 1195 [P.O.:240; I.V.:850; IV Piggyback:105] Out: 1150 [Urine:1100; Blood:50] Intake/Output this shift: Total I/O In: 646.3 [I.V.:646.3] Out: -    Basename 01/23/12 0422 01/22/12 1827  HGB 13.5 13.4    Basename 01/23/12 0422 01/22/12 1827  WBC 7.4 11.7*  RBC 4.41 4.39  HCT 40.4 40.1  PLT 285 282    Basename 01/22/12 1827  NA 141  K 3.4*  CL 102  CO2 28  BUN 12  CREATININE 0.78  GLUCOSE 120*  CALCIUM 9.4    Basename 01/23/12 0005  LABPT --  INR 1.01    Neurovascular intact Sensation intact distally Intact pulses distally Incision: dressing C/D/I Compartment soft  Assessment/Plan: 1 Day Post-Op Procedure(s) (LRB): OPEN REDUCTION INTERNAL (ORIF) FIXATION PATELLA (Left) Up with therapy WBAT left leg, but now knee motion Coumadin for DVT proph.  Ibn Stief Y 01/23/2012, 8:38 AM

## 2012-01-23 NOTE — Progress Notes (Signed)
Physical Therapy Treatment Patient Details Name: Vanessa Hubbard MRN: [id number] DOB: [birth year] Today's Date: 01/23/2012  PT Assessment/Plan  PT - Assessment/Plan Comments on Treatment Session: Marked improvement in activity tolerance.  Pt pleased with ability to [facility] PT Plan: Discharge plan remains appropriate PT Frequency: 7X/week Follow Up Recommendations: Home health PT Equipment Recommended: None recommended by PT PT Goals  Acute Rehab PT Goals PT Goal Formulation: With patient Time For Goal Achievement: 7 days Pt will go Supine/Side to Sit: with supervision PT Goal: Supine/Side to Sit - Progress: Goal set today Pt will go Sit to Supine/Side: with supervision PT Goal: Sit to Supine/Side - Progress: Goal set today Pt will go Sit to Stand: with supervision PT Goal: Sit to Stand - Progress: Goal set today Pt will go Stand to Sit: with supervision PT Goal: Stand to Sit - Progress: Goal set today Pt will Ambulate: 51 - 150 feet;with supervision;with rolling walker PT Goal: Ambulate - Progress: Goal set today Pt will Go Up / Down Stairs: 1-2 stairs;with min assist;with rolling walker PT Goal: Up/Down Stairs - Progress: Goal set today  PT Treatment Precautions/Restrictions  Precautions Precautions: Fall Required Braces or Orthoses: Yes Knee Immobilizer: On at all times Restrictions Weight Bearing Restrictions: No LLE Weight Bearing: Weight bearing as tolerated Other Position/Activity Restrictions: NO ROM L KNEE Mobility (including Balance) Bed Mobility Sit to Supine: 4: Min assist Sit to Supine - Details (indicate cue type and reason): VC for sequence - min assist with L LE Transfers Sit to Stand: 4: Min assist Sit to Stand Details (indicate cue type and reason): cues for use of UEs and for LE management Stand to Sit: 4: Min assist Stand to Sit Details: cues for use of UEs and for LE management Ambulation/Gait Ambulation/Gait Assistance: 4: Min assist Ambulation/Gait  Assistance Details (indicate cue type and reason): cues for posture and position from RW Ambulation Distance (Feet): 110 Feet (110 + 25') Assistive device: Rolling walker Gait Pattern: Step-to pattern Stairs: No    Exercise    End of Session PT - End of Session Equipment Utilized During Treatment: Left knee immobilizer Activity Tolerance: Patient tolerated treatment well Patient left: in bed;with call bell in reach;with family/visitor present Nurse Communication: Mobility status for transfers;Mobility status for ambulation General Behavior During Session: [facility] for tasks performed Cognition: [facility] for tasks performed  Melesio Madara 01/23/2012, 4:31 PM

## 2012-01-23 NOTE — Progress Notes (Signed)
ANTICOAGULATION CONSULT NOTE - Initial Consult  Pharmacy Consult for Warfarin Indication: VTE prophylaxis s/p ORIF fractured patella  No Known Allergies  Patient Measurements: Height: 5\' 3"  (160 cm) Weight: 173 lb (78.472 kg) IBW/kg (Calculated) : 52.4   Vital Signs: Temp: 97.5 F (36.4 C) (03/16 1400) BP: 112/71 mmHg (03/16 1400) Pulse Rate: 82  (03/16 1400)  Labs:  Basename 01/23/12 0422 01/23/12 0005 01/22/12 1827  HGB 13.5 -- 13.4  HCT 40.4 -- 40.1  PLT 285 -- 282  APTT -- -- --  LABPROT -- 13.5 --  INR -- 1.01 --  HEPARINUNFRC -- -- --  CREATININE -- -- 0.78  CKTOTAL -- -- --  CKMB -- -- --  TROPONINI -- -- --   Estimated Creatinine Clearance: 67.7 ml/min (by C-G formula based on Cr of 0.78).  Medical History: Past Medical History  Diagnosis Date  . Hypertension   . Thyroid disease     Medications:  Scheduled:     . 0.9 % NaCl with KCl 20 mEq / L      . aspirin  325 mg Oral Daily  .  ceFAZolin (ANCEF) IV  1 g Intravenous Q8H  .  ceFAZolin (ANCEF) IV  2 g Intravenous 60 min Pre-Op  . cholecalciferol  2,000 Units Oral q morning - 10a  . coumadin book   Does not apply Once  . hydrochlorothiazide  25 mg Oral Daily  . levothyroxine  125 mcg Oral QAC breakfast  . warfarin  5 mg Oral Once  . warfarin   Does not apply Once  . Warfarin - Pharmacist Dosing Inpatient   Does not apply q1800    Assessment:  66 yo F admit with fractured left patella due to falling  s/p ORIF fractured patella 3/15, notes indicate hemodynamically stable post-op  Coumadin started 3/16 at 0100  CBC stable today  Baseline INR 1.01   Goal of Therapy:  INR 2-3   Plan:  1.) Repeat coumadin 5 mg po x 1 at 1800 2.) Daily PT/INR 3.) Coumadin book/video/education  Keyona Emrich, Loma Messing PharmD 4:42 PM 01/23/2012

## 2012-01-23 NOTE — Evaluation (Signed)
Physical Therapy Evaluation Patient Details Name: Vanessa Hubbard MRN: [id number] DOB: [birth year] Today's Date: 01/23/2012  Problem List: There is no problem list on file for this patient.   Past Medical History:  Past Medical History  Diagnosis Date  . Hypertension   . Thyroid disease    Past Surgical History:  Past Surgical History  Procedure Date  . Ankle surgery   . Abdominal hysterectomy     PT Assessment/Plan/Recommendation PT Assessment Clinical Impression Statement: Pt with L patella fx presents with decreased L LE strength/ROM  and limited functional mobiltiy. PT Recommendation/Assessment: Patient will need skilled PT in the acute care venue PT Problem List: Decreased strength;Decreased range of motion;Decreased activity tolerance;Decreased mobility;Decreased knowledge of use of DME;Pain PT Therapy Diagnosis : Difficulty walking PT Plan PT Frequency: 7X/week PT Treatment/Interventions: DME instruction;Gait training;Stair training;Functional mobility training;Therapeutic activities;Therapeutic exercise;Patient/family education PT Recommendation Recommendations for Other Services: OT consult Follow Up Recommendations: Home health PT Equipment Recommended: None recommended by PT (pt states she can borrow RW) PT Goals  Acute Rehab PT Goals PT Goal Formulation: With patient Time For Goal Achievement: 7 days Pt will go Supine/Side to Sit: with supervision PT Goal: Supine/Side to Sit - Progress: Goal set today Pt will go Sit to Supine/Side: with supervision PT Goal: Sit to Supine/Side - Progress: Goal set today Pt will go Sit to Stand: with supervision PT Goal: Sit to Stand - Progress: Goal set today Pt will go Stand to Sit: with supervision PT Goal: Stand to Sit - Progress: Goal set today Pt will Ambulate: 51 - 150 feet;with supervision;with rolling walker PT Goal: Ambulate - Progress: Goal set today Pt will Go Up / Down Stairs: 1-2 stairs;with min assist;with  rolling walker PT Goal: Up/Down Stairs - Progress: Goal set today  PT Evaluation Precautions/Restrictions  Precautions Precautions: Fall Required Braces or Orthoses: Yes Knee Immobilizer: On at all times Restrictions Weight Bearing Restrictions: No LLE Weight Bearing: Weight bearing as tolerated Other Position/Activity Restrictions: NO ROM L KNEE Prior Functioning  Home Living Lives With: Spouse Receives Help From: Family Type of Home: House Home Layout: One level Home Access: Stairs to enter Entrance Stairs-Rails: None Entrance Stairs-Number of Steps: 2 (1+1) Home Adaptive Equipment: Walker - rolling;Walker - four wheeled Prior Function Level of Independence: Independent with basic ADLs;Independent with gait;Independent with transfers Able to Take Stairs?: Yes Cognition Cognition Arousal/Alertness: Awake/alert Overall Cognitive Status: Appears within functional limits for tasks assessed Orientation Level: Oriented X4 Sensation/Coordination Coordination Gross Motor Movements are Fluid and Coordinated: Yes Extremity Assessment RUE Assessment RUE Assessment: Within Functional Limits LUE Assessment LUE Assessment: Within Functional Limits RLE Assessment RLE Assessment: Within Functional Limits LLE Assessment LLE Assessment: Exceptions to [facility] (KI in place at all times - NO ROM at knee) Mobility (including Balance) Bed Mobility Bed Mobility: Yes Supine to Sit: 4: Min assist Supine to Sit Details (indicate cue type and reason): cues for sequence and to self assist with UEs; min assist for L LE Transfers Transfers: Yes Sit to Stand: 4: Min assist Sit to Stand Details (indicate cue type and reason): cues for use of UEs and for LE management Stand to Sit: 4: Min assist Stand to Sit Details: cues for use of UEs and for LE management Ambulation/Gait Ambulation/Gait: Yes Ambulation/Gait Assistance: 4: Min assist;3: Mod assist Ambulation/Gait Assistance Details (indicate  cue type and reason): cues for sequence, position from RW and posture Ambulation Distance (Feet): 20 Feet Assistive device: Rolling walker Gait Pattern: Step-to pattern    Exercise  End of Session PT - End of Session Activity Tolerance: Patient tolerated treatment well Patient left: in chair;with call bell in reach;with family/visitor present Nurse Communication: Mobility status for transfers;Mobility status for ambulation General Behavior During Session: [facility] for tasks performed Cognition: [facility] for tasks performed  Johanna Matto 01/23/2012, 1:19 PM

## 2012-01-23 NOTE — Op Note (Signed)
NAME:  Vanessa Hubbard, Vanessa Hubbard NO.:  [id number]  MEDICAL RECORD NO.:  [id number]  LOCATION:  1601                         FACILITY:  [facility]  PHYSICIAN:  Burnard Bunting, M.D.    DATE OF BIRTH:  1946-06-14  DATE OF PROCEDURE: DATE OF DISCHARGE:                              OPERATIVE REPORT   PREOPERATIVE DIAGNOSIS:  Left patella fracture.  POSTOPERATIVE DIAGNOSIS:  Left patella fracture.  PROCEDURE:  Open reduction and internal fixation of left patellar fracture.  SURGEON:  Burnard Bunting, M.D.  ASSISTANT:  None.  ANESTHESIA:  General endotracheal.  BLOOD LOSS:  50 mL.  DRAINS:  None.  TOURNIQUET TIME:  32 minutes at 300 mmHg.  INDICATIONS:  Vanessa Hubbard is a patient, 66 year old, with left patellar fracture and displaced, presents for operative management after explanation of risks and benefits.  PROCEDURE IN DETAIL:  The patient brought to the operating room where general endotracheal anesthesia induced.  Preoperative antibiotics administered.  Left leg was prescribed with alcohol and Betadine, which allowed to air dry, prepped with DuraPrep solution and draped in sterile manner.  The time-out was called.  Operative field was covered with Ioban.  Leg was elevated and exsanguinated with Esmarch wrap. Tourniquet was inflated.  Anterior approach to the knee was made.  Skin and subcutaneous tissue sharply divided.  Soft tissue of the patella was divided.  The fracture was identified.  Thorough irrigation of the joint was performed, 2 pins near the fracture was then reduced under fluoroscopic guidance and two 3 size screws were placed across the fracture site.  Distal proximal figure-of-8 FiberWire was then placed to reinforce the repair.  The reduced fracture was held in place with a reduction tenaculum and a good repair was achieved.  Tourniquet was released.  Thorough irrigation was performed.  Incision was closed using interrupted inverted 0 Vicryl suture,  2-0 Vicryl suture and skin staples.  Bulky dressing was applied.  The patient tolerated the procedure well without immediate complication.     Burnard Bunting, M.D.     GSD/MEDQ  D:  01/22/2012  T:  01/23/2012  Job:  [id number]

## 2012-01-24 LAB — PROTIME-INR: Prothrombin Time: 16.4 seconds — ABNORMAL HIGH (ref 11.6–15.2)

## 2012-01-24 LAB — CBC
MCH: 30.3 pg (ref 26.0–34.0)
MCHC: 32.6 g/dL (ref 30.0–36.0)
MCV: 93 fL (ref 78.0–100.0)
Platelets: 266 10*3/uL (ref 150–400)
RDW: 13.6 % (ref 11.5–15.5)

## 2012-01-24 MED ORDER — WARFARIN SODIUM 5 MG PO TABS
5.0000 mg | ORAL_TABLET | Freq: Once | ORAL | Status: AC
  Administered 2012-01-24: 5 mg via ORAL
  Filled 2012-01-24: qty 1

## 2012-01-24 NOTE — Progress Notes (Signed)
CARE MANAGEMENT NOTE 01/24/2012  Patient:  Vanessa Hubbard, Vanessa Hubbard   Account Number:  [id number]  Date Initiated:  01/24/2012  Documentation initiated by:  Diante Barley  Subjective/Objective Assessment:   66 yo female admitted s/p left open reduction internal fixation of the patella. PTA pt lived with spouse.     Action/Plan:   Home when stable   Anticipated DC Date:  01/25/2012   Anticipated DC Plan:  HOME W HOME HEALTH SERVICES  In-house referral  NA      DC Planning Services  CM consult      [facility] Choice  HOME HEALTH   Choice offered to / List presented to:  C-1 Patient   DME arranged  NA      DME agency  NA     HH arranged  HH-2 PT      HH agency  [facility]   Status of service:  In process, will continue to follow Medicare Important Message given?   (If response is "NO", the following Medicare IM given date fields will be blank) Date Medicare IM given:   Date Additional Medicare IM given:    Discharge Disposition:    Per UR Regulation:    If discussed at Long Length of Stay Meetings, dates discussed:    Comments:  01/24/12 1058 Vanessa Condie,Rn,BSN [phone] Cm spoke with pt concerning d/c planning. Per pt choice [facility] to provide [facility] services. [facility] rep Talmadge Coventry notified of pt referral. Pt states spouse will assistin home care. pt's husband to provide Rw and elevated tube seat from rental equipment company. No other HH services or needs requested. Awaiting MD orders.

## 2012-01-24 NOTE — Progress Notes (Signed)
Physical Therapy Treatment Patient Details Name: Vanessa Hubbard MRN: [id number] DOB: [birth year] Today's Date: 01/24/2012  PT Assessment/Plan  PT - Assessment/Plan Comments on Treatment Session: Increased activity today with stair education and home exercise education completed. Decreased assistance needed with mobility today. PT Plan: Discharge plan remains appropriate;Frequency remains appropriate PT Frequency: 7X/week Follow Up Recommendations: Home health PT Equipment Recommended: None recommended by PT PT Goals  Acute Rehab PT Goals PT Goal: Supine/Side to Sit - Progress: Progressing toward goal PT Goal: Sit to Stand - Progress: Met PT Goal: Stand to Sit - Progress: Met PT Goal: Ambulate - Progress: Progressing toward goal PT Goal: Up/Down Stairs - Progress: Met  PT Treatment Precautions/Restrictions  Precautions Precautions: Fall Required Braces or Orthoses: Yes Knee Immobilizer: On at all times Restrictions Weight Bearing Restrictions: No LLE Weight Bearing: Weight bearing as tolerated Other Position/Activity Restrictions: NO ROM L KNEE Mobility (including Balance) Bed Mobility Supine to Sit: 4: Min assist;HOB flat Supine to Sit Details (indicate cue type and reason): assist for left LE movement to edge of bed and support while pt elevated trunk herself and scooted to edge of bed. cues for technque/hand placement to make transitions easier. (pt= 85-90%). Transfers Sit to Stand: 5: Supervision;From bed;From chair/3-in-1 Sit to Stand Details (indicate cue type and reason): cues for hand placement with standing from bed. demo'd safe technique with standing from [facility] over toilet. Stand to Sit: 5: Supervision;To chair/3-in-1 Stand to Sit Details: demo'd safe technique with sitting to [facility] over the toilet and to the recliner. Ambulation/Gait Ambulation/Gait Assistance: 5: Supervision Ambulation/Gait Assistance Details (indicate cue type and reason): occasional cues for  posture with gait.  Ambulation Distance (Feet): 70 Feet Assistive device: Rolling walker Gait Pattern: Step-to pattern;Decreased stance time - left;Decreased step length - right;Antalgic Stairs: Yes Stairs Assistance: 4: Min assist Stairs Assistance Details (indicate cue type and reason): cues for technique and sequency with going up with strong leg first and down with surgical leg first. support for walker only needed. Stair Management Technique: With walker;Backwards Number of Stairs: 1   Posture/Postural Control Posture/Postural Control: No significant limitations Balance Balance Assessed: Yes Dynamic Standing Balance Dynamic Standing - Balance Support: During functional activity (alternated between no UE support and Unilateral UE support  ) Dynamic Standing - Level of Assistance: 5: Stand by assistance Dynamic Standing - Comments: pt stood at sink to wash hands (including reaching for soap, papertowels and turning to throw items in trash) all without UE support. Pt also stood at sink to brush teeth with alternating UE support on the sink.  Exercise  Total Joint Exercises Ankle Circles/Pumps: AROM;Both;10 reps;Supine Quad Sets: AROM;Both;Strengthening;10 reps;Supine (isometric contraction with KI on the left leg) Hip ABduction/ADduction: AAROM;Left;10 reps;Supine (with KI on) Straight Leg Raises: AAROM;Left;10 reps;Supine (with KI on) End of Session PT - End of Session Equipment Utilized During Treatment: Gait belt;Left knee immobilizer Activity Tolerance: Patient tolerated treatment well Patient left: in chair;with call bell in reach Nurse Communication: Mobility status for transfers;Mobility status for ambulation;Weight bearing status General Behavior During Session: [facility] for tasks performed Cognition: [facility] for tasks performed  Sallyanne Kuster 01/24/2012, 10:27 AM  Sallyanne Kuster, PTA Office- [phone] Pager- [phone]

## 2012-01-24 NOTE — Progress Notes (Signed)
ANTICOAGULATION CONSULT NOTE - Initial Consult  Pharmacy Consult for Warfarin Indication: VTE prophylaxis s/p ORIF fractured patella  No Known Allergies  Patient Measurements: Height: 5\' 3"  (160 cm) Weight: 173 lb (78.472 kg) IBW/kg (Calculated) : 52.4   Vital Signs: Temp: 97.8 F (36.6 C) (03/17 0452) Temp src: Oral (03/17 0452) BP: 118/74 mmHg (03/17 0452) Pulse Rate: 82  (03/17 0452)  Labs:  Basename 01/24/12 0444 01/23/12 0422 01/23/12 0005 01/22/12 1827  HGB 11.3* 13.5 -- --  HCT 34.4* 40.4 -- 40.1  PLT 266 285 -- 282  APTT -- -- -- --  LABPROT 16.4* -- 13.5 --  INR 1.30 -- 1.01 --  HEPARINUNFRC -- -- -- --  CREATININE -- -- -- 0.78  CKTOTAL -- -- -- --  CKMB -- -- -- --  TROPONINI -- -- -- --   Estimated Creatinine Clearance: 67.7 ml/min (by C-G formula based on Cr of 0.78).  Medical History: Past Medical History  Diagnosis Date  . Hypertension   . Thyroid disease     Medications:  Scheduled:     . 0.9 % NaCl with KCl 20 mEq / L      . aspirin  325 mg Oral Daily  .  ceFAZolin (ANCEF) IV  1 g Intravenous Q8H  . cholecalciferol  2,000 Units Oral q morning - 10a  . coumadin book   Does not apply Once  . hydrochlorothiazide  25 mg Oral Daily  . levothyroxine  125 mcg Oral QAC breakfast  . warfarin  5 mg Oral ONCE-1800  . warfarin   Does not apply Once  . Warfarin - Pharmacist Dosing Inpatient   Does not apply q1800    Assessment:  66 yo F admit with fractured left patella due to falling, s/p ORIF fractured patella 3/15  Coumadin initiation in process (started 3/16 at 0100) with INR moving toward goal.   H/H decreased but stable  No bleeding reported/documented   Goal of Therapy:  INR 2-3   Plan:  1.) Repeat coumadin 5 mg po x 1 at 1800 2.) Daily PT/INR 3.) Coumadin book/video/education  Vanessa Hubbard, Loma Messing PharmD 9:41 AM 01/24/2012

## 2012-01-24 NOTE — Plan of Care (Signed)
Problem: Phase II Progression Outcomes Goal: Foley discontinued Outcome: Not Applicable Date Met:  01/24/12 No foley

## 2012-01-24 NOTE — Progress Notes (Signed)
Patient ID: Vanessa Hubbard, female   DOB: 10/09/1946, 66 y.o.   MRN: [id number] Much improved mobility in PT with yesterday afternoon session.  Pain better controlled.  Calf soft. NVI. Dressing changes as well.  Plan: D/C home tomorrow with HHPT, coumadin

## 2012-01-25 LAB — CBC
MCH: 30.1 pg (ref 26.0–34.0)
MCHC: 32.2 g/dL (ref 30.0–36.0)
MCV: 93.2 fL (ref 78.0–100.0)
Platelets: 252 10*3/uL (ref 150–400)
RBC: 3.96 MIL/uL (ref 3.87–5.11)
RDW: 13.6 % (ref 11.5–15.5)

## 2012-01-25 NOTE — Progress Notes (Signed)
Physical Therapy Treatment Patient Details Name: Vanessa Hubbard MRN: [id number] DOB: [birth year] Today's Date: 01/25/2012  L Patella FW with ORIF WBAT, KI all times, NO KNEE ROM 10:05 - 10:45 1 gt  1 ta  1 hm  PT Assessment/Plan  PT - Assessment/Plan Comments on Treatment Session: Instructed pt/spouse on KI use (all times), proper application and caution with removal for hygiene, instructed on NO knee ROM, given copy of X Ray and educated on ORIF, instructed spouse on how to assist pt in/out bed, bathroom, up/down one step and some HEP.  Pt plans to D/C to home today. PT Plan: Discharge plan remains appropriate Follow Up Recommendations: Home health PT Equipment Recommended: Other (comment) (Pt/spouse would poss like a 3:1 but want to know $ first) PT Goals  Acute Rehab PT Goals PT Goal Formulation: With patient Pt will go Supine/Side to Sit: with supervision PT Goal: Supine/Side to Sit - Progress: Met Pt will go Sit to Supine/Side: with supervision PT Goal: Sit to Supine/Side - Progress: Met Pt will go Sit to Stand: with supervision PT Goal: Sit to Stand - Progress: Met Pt will go Stand to Sit: with supervision PT Goal: Stand to Sit - Progress: Met Pt will Ambulate: 51 - 150 feet;with supervision;with rolling walker PT Goal: Ambulate - Progress: Met Pt will Go Up / Down Stairs: 1-2 stairs;with min assist;with rolling walker PT Goal: Up/Down Stairs - Progress: Met  PT Treatment Precautions/Restrictions  Precautions Precautions: Fall Required Braces or Orthoses: Yes Knee Immobilizer: On at all times Restrictions Weight Bearing Restrictions: No LLE Weight Bearing: Weight bearing as tolerated Other Position/Activity Restrictions: NO ROM L KNEE Mobility (including Balance) Bed Mobility Bed Mobility: Yes Supine to Sit: 5: Supervision Supine to Sit Details (indicate cue type and reason): increased time and instructed spouse how to support L LE off bed  Sit to Supine: 5:  Supervision Sit to Supine - Details (indicate cue type and reason): ncreased time and instructed spouse how to support L LE off bed  Transfers Transfers: Yes Sit to Stand: 6: Modified independent (Device/Increase time);From bed;From toilet Sit to Stand Details (indicate cue type and reason): good use of hands and safety tech Stand to Sit: 6: Modified independent (Device/Increase time);To bed;To toilet Stand to Sit Details: good use of hands and safety tech Ambulation/Gait Ambulation/Gait: Yes Ambulation/Gait Assistance: 5: Supervision Ambulation/Gait Assistance Details (indicate cue type and reason): with KI and spouse with instructions on safety with turns and backward gait sequencing. Ambulation Distance (Feet): 85 Feet Assistive device: Rolling walker Gait Pattern: Step-to pattern Gait velocity: increased time and no c/o PAIN, JUST "SORENESS". Stairs: Yes Stairs Assistance: Other (comment) (MinGuard Assist) Stairs Assistance Details (indicate cue type and reason): with spouse with instructions on proper sequencing and safety using RW. Stair Management Technique: No rails;Forwards Number of Stairs: 1  (1 x 1 up forward/down forward) Wheelchair Mobility Wheelchair Mobility: No    Exercise    End of Session PT - End of Session Equipment Utilized During Treatment: Gait belt;Left knee immobilizer Activity Tolerance: Patient tolerated treatment well Patient left: in bed;with call bell in reach;with family/visitor present General Behavior During Session: [facility] for tasks performed Cognition: [facility] for tasks performed  Felecia Shelling  PTA [facility]  Acute  Rehab Pager     [phone]

## 2012-01-25 NOTE — Progress Notes (Signed)
Pt stable vss dressing ok - mobilizing well - for dc home today

## 2012-01-25 NOTE — Progress Notes (Signed)
CARE MANAGEMENT NOTE 01/25/2012  Patient:  Vanessa Hubbard, Vanessa Hubbard   Account Number:  [id number]  Date Initiated:  01/24/2012  Documentation initiated by:  DAVIS,TYMEEKA  Subjective/Objective Assessment:   66 yo female admitted s/p left open reduction internal fixation of the patella. PTA pt lived with spouse.     Action/Plan:   Home when stable   Anticipated DC Date:  01/25/2012   Anticipated DC Plan:  HOME W HOME HEALTH SERVICES  In-house referral  NA      DC Planning Services  CM consult      [facility] Choice  HOME HEALTH   Choice offered to / List presented to:  C-1 Patient   DME arranged  3-N-1      DME agency  [facility]     HH arranged  HH-2 PT  HH-1 RN      [facility] agency  [facility]   Status of service:  Completed, signed off Medicare Important Message given?  NA - LOS <3 / Initial given by admissions (If response is "NO", the following Medicare IM given date fields will be blank) Comments:  01/25/2012 Raynelle Bring BSN CCM [phone] Pt will require [facility] for coumading management with blood drws in addition to HHpt.  Pt is also requesting 3N1-DME. [facility] rep notified of additional need for HHR and DME.  3n1delivered to patient's room. HH services to start tomorrow 01/26/2012. Pt discharging today.  List of choice HH agencies on shadow chart.

## 2012-01-26 DIAGNOSIS — I10 Essential (primary) hypertension: Secondary | ICD-10-CM | POA: Diagnosis not present

## 2012-01-26 DIAGNOSIS — Z5181 Encounter for therapeutic drug level monitoring: Secondary | ICD-10-CM | POA: Diagnosis not present

## 2012-01-26 DIAGNOSIS — Z7901 Long term (current) use of anticoagulants: Secondary | ICD-10-CM | POA: Diagnosis not present

## 2012-01-26 DIAGNOSIS — IMO0001 Reserved for inherently not codable concepts without codable children: Secondary | ICD-10-CM | POA: Diagnosis not present

## 2012-01-27 DIAGNOSIS — Z5181 Encounter for therapeutic drug level monitoring: Secondary | ICD-10-CM | POA: Diagnosis not present

## 2012-01-27 DIAGNOSIS — IMO0001 Reserved for inherently not codable concepts without codable children: Secondary | ICD-10-CM | POA: Diagnosis not present

## 2012-01-27 DIAGNOSIS — Z7901 Long term (current) use of anticoagulants: Secondary | ICD-10-CM | POA: Diagnosis not present

## 2012-01-27 DIAGNOSIS — I10 Essential (primary) hypertension: Secondary | ICD-10-CM | POA: Diagnosis not present

## 2012-01-27 NOTE — Discharge Summary (Signed)
Physician Discharge Summary  Patient ID: Vanessa Hubbard MRN: [id number] DOB/AGE: 1945-11-11 66 y.o.  Admit date: 01/22/2012 Discharge date: 01/27/2012  Admission Diagnoses:  Patella fracture  Discharge Diagnoses:  Same  Surgeries: Procedure(s): OPEN REDUCTION INTERNAL (ORIF) FIXATION PATELLA on 01/22/2012   Consultants:    Discharged Condition: Stable  Hospital Course: Vanessa Hubbard is an 66 y.o. female who was admitted 01/22/2012 with a chief complaint of  Chief Complaint  Patient presents with  . Fall  . Knee Pain  , and found to have a diagnosis of patella fracture.  They were brought to the operating room on 01/22/2012 and underwent the above named procedures.    Antibiotics given:  Anti-infectives     Start     Dose/Rate Route Frequency Ordered Stop   01/23/12 0430   ceFAZolin (ANCEF) IVPB 1 g/50 mL premix        1 g 100 mL/hr over 30 Minutes Intravenous Every 8 hours 01/22/12 2344 01/23/12 2200   01/22/12 1719   ceFAZolin (ANCEF) IVPB 2 g/50 mL premix        2 g 100 mL/hr over 30 Minutes Intravenous 60 min pre-op 01/22/12 1719 01/22/12 2041        .  Recent vital signs:  Filed Vitals:   01/25/12 0530  BP: 132/79  Pulse: 81  Temp: 97.3 F (36.3 C)  Resp: 17    Recent laboratory studies:  Results for orders placed during the hospital encounter of 01/22/12  CBC      Component Value Range   WBC 11.7 (*) 4.0 - 10.5 (K/uL)   RBC 4.39  3.87 - 5.11 (MIL/uL)   Hemoglobin 13.4  12.0 - 15.0 (g/dL)   HCT 11.9  14.7 - 82.9 (%)   MCV 91.3  78.0 - 100.0 (fL)   MCH 30.5  26.0 - 34.0 (pg)   MCHC 33.4  30.0 - 36.0 (g/dL)   RDW 56.2  13.0 - 86.5 (%)   Platelets 282  150 - 400 (K/uL)  BASIC METABOLIC PANEL      Component Value Range   Sodium 141  135 - 145 (mEq/L)   Potassium 3.4 (*) 3.5 - 5.1 (mEq/L)   Chloride 102  96 - 112 (mEq/L)   CO2 28  19 - 32 (mEq/L)   Glucose, Bld 120 (*) 70 - 99 (mg/dL)   BUN 12  6 - 23 (mg/dL)   Creatinine, Ser 7.84  0.50 - 1.10  (mg/dL)   Calcium 9.4  8.4 - 69.6 (mg/dL)   GFR calc non Af Amer 85 (*) >90 (mL/min)   GFR calc Af Amer >90  >90 (mL/min)  MRSA PCR SCREENING      Component Value Range   MRSA by PCR NEGATIVE  NEGATIVE   CBC      Component Value Range   WBC 7.4  4.0 - 10.5 (K/uL)   RBC 4.41  3.87 - 5.11 (MIL/uL)   Hemoglobin 13.5  12.0 - 15.0 (g/dL)   HCT 29.5  28.4 - 13.2 (%)   MCV 91.6  78.0 - 100.0 (fL)   MCH 30.6  26.0 - 34.0 (pg)   MCHC 33.4  30.0 - 36.0 (g/dL)   RDW 44.0  10.2 - 72.5 (%)   Platelets 285  150 - 400 (K/uL)  PROTIME-INR      Component Value Range   Prothrombin Time 13.5  11.6 - 15.2 (seconds)   INR 1.01  0.00 - 1.49   PROTIME-INR  Component Value Range   Prothrombin Time 16.4 (*) 11.6 - 15.2 (seconds)   INR 1.30  0.00 - 1.49   CBC      Component Value Range   WBC 8.8  4.0 - 10.5 (K/uL)   RBC 3.70 (*) 3.87 - 5.11 (MIL/uL)   Hemoglobin 11.3 (*) 12.0 - 15.0 (g/dL)   HCT 45.4 (*) 09.8 - 46.0 (%)   MCV 93.0  78.0 - 100.0 (fL)   MCH 30.3  26.0 - 34.0 (pg)   MCHC 32.6  30.0 - 36.0 (g/dL)   RDW 11.9  14.7 - 82.9 (%)   Platelets 266  150 - 400 (K/uL)  PROTIME-INR      Component Value Range   Prothrombin Time 19.0 (*) 11.6 - 15.2 (seconds)   INR 1.56 (*) 0.00 - 1.49   CBC      Component Value Range   WBC 7.3  4.0 - 10.5 (K/uL)   RBC 3.96  3.87 - 5.11 (MIL/uL)   Hemoglobin 11.9 (*) 12.0 - 15.0 (g/dL)   HCT 56.2  13.0 - 86.5 (%)   MCV 93.2  78.0 - 100.0 (fL)   MCH 30.1  26.0 - 34.0 (pg)   MCHC 32.2  30.0 - 36.0 (g/dL)   RDW 78.4  69.6 - 29.5 (%)   Platelets 252  150 - 400 (K/uL)    Discharge Medications:   Medication List  As of 01/27/2012  7:38 AM   STOP taking these medications         aspirin 325 MG tablet         TAKE these medications         cetirizine 10 MG tablet   Commonly known as: ZYRTEC   Take 10 mg by mouth daily as needed. Allergies.      hydrochlorothiazide 25 MG tablet   Commonly known as: HYDRODIURIL   Take 25 mg by mouth daily.       levothyroxine 125 MCG tablet   Commonly known as: SYNTHROID, LEVOTHROID   Take 125 mcg by mouth daily.      methocarbamol 500 MG tablet   Commonly known as: ROBAXIN   Take 1 tablet (500 mg total) by mouth 4 (four) times daily.      multivitamin tablet   Take 1 tablet by mouth daily.      oxyCODONE-acetaminophen 5-325 MG per tablet   Commonly known as: PERCOCET   Take 1-2 tablets by mouth every 4 (four) hours as needed.      Vitamin D3 2000 UNITS Tabs   Take 1 tablet by mouth.      warfarin 5 MG tablet   Commonly known as: COUMADIN   Take 1 tablet (5 mg total) by mouth daily.            Diagnostic Studies: Dg Chest 2 View  01/22/2012  *RADIOLOGY REPORT*  Clinical Data: Preoperative respiratory exam.  Patella fracture.  CHEST - 2 VIEW  Comparison: None.  Findings: The heart size and vascularity are normal and the lungs are clear.  No osseous abnormality.  IMPRESSION: Normal chest.  Original Report Authenticated By: Gwynn Burly, M.D.   Dg Knee Complete 4 Views Left  01/22/2012  *RADIOLOGY REPORT*  Clinical Data: Recent fall with knee pain  LEFT KNEE - COMPLETE 4+ VIEW  Comparison: None.  Findings:  There is a transverse fracture through the mid patella with distraction of the superior fragment cephalad by approximately 14 mm.  There is a joint  effusion present.  Some loss of the medial joint space is noted. There is soft tissue swelling superficially anteriorly.  IMPRESSION: Transverse distracted fracture of the mid patella with small effusion.  Original Report Authenticated By: Juline Patch, M.D.   Dg Knee Left Port  01/23/2012  *RADIOLOGY REPORT*  Clinical Data: Status post internal fixation of patellar fracture.  PORTABLE LEFT KNEE - 1-2 VIEW  Comparison: Left knee radiographs performed earlier today at 02:56 p.m.  Findings: There has been interval placement of two screws across the patellar fracture, seen in near-anatomic alignment.  No new fractures are seen.  No definite  knee joint effusion is characterized, though this is difficult to assess due to overlying soft tissue swelling. Overlying skin staples are seen.  Visualized joint spaces are grossly preserved.  IMPRESSION: Interval internal fixation of patellar fracture, seen in near- anatomic alignment.  Original Report Authenticated By: Tonia Ghent, M.D.   Dg C-arm 1-60 Min  01/25/2012  *RADIOLOGY REPORT*  Clinical Data: Patellar fracture.  C-ARM 1-60 MIN  Comparison: 01/22/2012.  Findings: Two C-arm views submitted for review after surgery.  This reveals reduction of the patellar fracture utilizing two screws. Minimal separation of fracture fragments.  IMPRESSION: Open reduction and internal fixation of patellar fracture.  Original Report Authenticated By: Fuller Canada, M.D.    Disposition: 01-Home or Self Care  Discharge Orders    Future Orders Please Complete By Expires   Diet - low sodium heart healthy      Diet - low sodium heart healthy      Call MD / Call 911      Comments:   If you experience chest pain or shortness of breath, CALL 911 and be transported to the hospital emergency room.  If you develope a fever above 101 F, pus (white drainage) or increased drainage or redness at the wound, or calf pain, call your surgeon's office.   Constipation Prevention      Comments:   Drink plenty of fluids.  Prune juice may be helpful.  You may use a stool softener, such as Colace (over the counter) 100 mg twice a day.  Use MiraLax (over the counter) for constipation as needed.   Increase activity slowly as tolerated      Weight Bearing as taught in Physical Therapy      Comments:   Use a walker or crutches as instructed.   Discharge instructions      Comments:   1. Weight bearing as tolerated with crutches and knee immobilizer on at all times. 2. Keep incision dry 3.Return to clinic 7 days   Call MD / Call 911      Comments:   If you experience chest pain or shortness of breath, CALL 911 and be  transported to the hospital emergency room.  If you develope a fever above 101 F, pus (white drainage) or increased drainage or redness at the wound, or calf pain, call your surgeon's office.   Constipation Prevention      Comments:   Drink plenty of fluids.  Prune juice may be helpful.  You may use a stool softener, such as Colace (over the counter) 100 mg twice a day.  Use MiraLax (over the counter) for constipation as needed.   Increase activity slowly as tolerated      Weight Bearing as taught in Physical Therapy      Comments:   Use a walker or crutches as instructed.  SignedCammy Copa 01/27/2012, 7:38 AM

## 2012-01-28 DIAGNOSIS — IMO0001 Reserved for inherently not codable concepts without codable children: Secondary | ICD-10-CM | POA: Diagnosis not present

## 2012-01-28 DIAGNOSIS — Z7901 Long term (current) use of anticoagulants: Secondary | ICD-10-CM | POA: Diagnosis not present

## 2012-01-28 DIAGNOSIS — Z5181 Encounter for therapeutic drug level monitoring: Secondary | ICD-10-CM | POA: Diagnosis not present

## 2012-01-28 DIAGNOSIS — I10 Essential (primary) hypertension: Secondary | ICD-10-CM | POA: Diagnosis not present

## 2012-01-29 DIAGNOSIS — Z7901 Long term (current) use of anticoagulants: Secondary | ICD-10-CM | POA: Diagnosis not present

## 2012-01-29 DIAGNOSIS — I10 Essential (primary) hypertension: Secondary | ICD-10-CM | POA: Diagnosis not present

## 2012-01-29 DIAGNOSIS — IMO0001 Reserved for inherently not codable concepts without codable children: Secondary | ICD-10-CM | POA: Diagnosis not present

## 2012-01-29 DIAGNOSIS — Z5181 Encounter for therapeutic drug level monitoring: Secondary | ICD-10-CM | POA: Diagnosis not present

## 2012-02-01 DIAGNOSIS — IMO0001 Reserved for inherently not codable concepts without codable children: Secondary | ICD-10-CM | POA: Diagnosis not present

## 2012-02-01 DIAGNOSIS — Z4789 Encounter for other orthopedic aftercare: Secondary | ICD-10-CM | POA: Diagnosis not present

## 2012-02-01 DIAGNOSIS — Z5181 Encounter for therapeutic drug level monitoring: Secondary | ICD-10-CM | POA: Diagnosis not present

## 2012-02-01 DIAGNOSIS — I10 Essential (primary) hypertension: Secondary | ICD-10-CM | POA: Diagnosis not present

## 2012-02-01 DIAGNOSIS — Z7901 Long term (current) use of anticoagulants: Secondary | ICD-10-CM | POA: Diagnosis not present

## 2012-02-01 DIAGNOSIS — S82009A Unspecified fracture of unspecified patella, initial encounter for closed fracture: Secondary | ICD-10-CM | POA: Diagnosis not present

## 2012-02-03 DIAGNOSIS — IMO0001 Reserved for inherently not codable concepts without codable children: Secondary | ICD-10-CM | POA: Diagnosis not present

## 2012-02-03 DIAGNOSIS — Z7901 Long term (current) use of anticoagulants: Secondary | ICD-10-CM | POA: Diagnosis not present

## 2012-02-03 DIAGNOSIS — I10 Essential (primary) hypertension: Secondary | ICD-10-CM | POA: Diagnosis not present

## 2012-02-03 DIAGNOSIS — Z5181 Encounter for therapeutic drug level monitoring: Secondary | ICD-10-CM | POA: Diagnosis not present

## 2012-02-04 DIAGNOSIS — Z7901 Long term (current) use of anticoagulants: Secondary | ICD-10-CM | POA: Diagnosis not present

## 2012-02-04 DIAGNOSIS — IMO0001 Reserved for inherently not codable concepts without codable children: Secondary | ICD-10-CM | POA: Diagnosis not present

## 2012-02-04 DIAGNOSIS — I10 Essential (primary) hypertension: Secondary | ICD-10-CM | POA: Diagnosis not present

## 2012-02-04 DIAGNOSIS — Z5181 Encounter for therapeutic drug level monitoring: Secondary | ICD-10-CM | POA: Diagnosis not present

## 2012-02-08 DIAGNOSIS — Z5181 Encounter for therapeutic drug level monitoring: Secondary | ICD-10-CM | POA: Diagnosis not present

## 2012-02-08 DIAGNOSIS — IMO0001 Reserved for inherently not codable concepts without codable children: Secondary | ICD-10-CM | POA: Diagnosis not present

## 2012-02-08 DIAGNOSIS — I10 Essential (primary) hypertension: Secondary | ICD-10-CM | POA: Diagnosis not present

## 2012-02-08 DIAGNOSIS — Z7901 Long term (current) use of anticoagulants: Secondary | ICD-10-CM | POA: Diagnosis not present

## 2012-02-09 DIAGNOSIS — IMO0001 Reserved for inherently not codable concepts without codable children: Secondary | ICD-10-CM | POA: Diagnosis not present

## 2012-02-09 DIAGNOSIS — Z5181 Encounter for therapeutic drug level monitoring: Secondary | ICD-10-CM | POA: Diagnosis not present

## 2012-02-09 DIAGNOSIS — Z7901 Long term (current) use of anticoagulants: Secondary | ICD-10-CM | POA: Diagnosis not present

## 2012-02-09 DIAGNOSIS — I10 Essential (primary) hypertension: Secondary | ICD-10-CM | POA: Diagnosis not present

## 2012-02-10 ENCOUNTER — Encounter (HOSPITAL_COMMUNITY): Payer: Self-pay | Admitting: Orthopedic Surgery

## 2012-02-11 DIAGNOSIS — IMO0001 Reserved for inherently not codable concepts without codable children: Secondary | ICD-10-CM | POA: Diagnosis not present

## 2012-02-11 DIAGNOSIS — Z5181 Encounter for therapeutic drug level monitoring: Secondary | ICD-10-CM | POA: Diagnosis not present

## 2012-02-11 DIAGNOSIS — Z7901 Long term (current) use of anticoagulants: Secondary | ICD-10-CM | POA: Diagnosis not present

## 2012-02-11 DIAGNOSIS — I10 Essential (primary) hypertension: Secondary | ICD-10-CM | POA: Diagnosis not present

## 2012-02-15 DIAGNOSIS — Z7901 Long term (current) use of anticoagulants: Secondary | ICD-10-CM | POA: Diagnosis not present

## 2012-02-15 DIAGNOSIS — I10 Essential (primary) hypertension: Secondary | ICD-10-CM | POA: Diagnosis not present

## 2012-02-15 DIAGNOSIS — IMO0001 Reserved for inherently not codable concepts without codable children: Secondary | ICD-10-CM | POA: Diagnosis not present

## 2012-02-15 DIAGNOSIS — Z5181 Encounter for therapeutic drug level monitoring: Secondary | ICD-10-CM | POA: Diagnosis not present

## 2012-02-18 DIAGNOSIS — Z7901 Long term (current) use of anticoagulants: Secondary | ICD-10-CM | POA: Diagnosis not present

## 2012-02-18 DIAGNOSIS — Z5181 Encounter for therapeutic drug level monitoring: Secondary | ICD-10-CM | POA: Diagnosis not present

## 2012-02-18 DIAGNOSIS — I10 Essential (primary) hypertension: Secondary | ICD-10-CM | POA: Diagnosis not present

## 2012-02-18 DIAGNOSIS — IMO0001 Reserved for inherently not codable concepts without codable children: Secondary | ICD-10-CM | POA: Diagnosis not present

## 2012-02-23 DIAGNOSIS — Z7901 Long term (current) use of anticoagulants: Secondary | ICD-10-CM | POA: Diagnosis not present

## 2012-02-23 DIAGNOSIS — I10 Essential (primary) hypertension: Secondary | ICD-10-CM | POA: Diagnosis not present

## 2012-02-23 DIAGNOSIS — IMO0001 Reserved for inherently not codable concepts without codable children: Secondary | ICD-10-CM | POA: Diagnosis not present

## 2012-02-23 DIAGNOSIS — Z5181 Encounter for therapeutic drug level monitoring: Secondary | ICD-10-CM | POA: Diagnosis not present

## 2012-02-26 DIAGNOSIS — Z5181 Encounter for therapeutic drug level monitoring: Secondary | ICD-10-CM | POA: Diagnosis not present

## 2012-02-26 DIAGNOSIS — IMO0001 Reserved for inherently not codable concepts without codable children: Secondary | ICD-10-CM | POA: Diagnosis not present

## 2012-02-26 DIAGNOSIS — Z7901 Long term (current) use of anticoagulants: Secondary | ICD-10-CM | POA: Diagnosis not present

## 2012-02-26 DIAGNOSIS — I10 Essential (primary) hypertension: Secondary | ICD-10-CM | POA: Diagnosis not present

## 2012-02-29 DIAGNOSIS — I10 Essential (primary) hypertension: Secondary | ICD-10-CM | POA: Diagnosis not present

## 2012-02-29 DIAGNOSIS — Z7901 Long term (current) use of anticoagulants: Secondary | ICD-10-CM | POA: Diagnosis not present

## 2012-02-29 DIAGNOSIS — IMO0001 Reserved for inherently not codable concepts without codable children: Secondary | ICD-10-CM | POA: Diagnosis not present

## 2012-02-29 DIAGNOSIS — Z5181 Encounter for therapeutic drug level monitoring: Secondary | ICD-10-CM | POA: Diagnosis not present

## 2012-03-03 DIAGNOSIS — Z7901 Long term (current) use of anticoagulants: Secondary | ICD-10-CM | POA: Diagnosis not present

## 2012-03-03 DIAGNOSIS — Z5181 Encounter for therapeutic drug level monitoring: Secondary | ICD-10-CM | POA: Diagnosis not present

## 2012-03-03 DIAGNOSIS — I10 Essential (primary) hypertension: Secondary | ICD-10-CM | POA: Diagnosis not present

## 2012-03-03 DIAGNOSIS — IMO0001 Reserved for inherently not codable concepts without codable children: Secondary | ICD-10-CM | POA: Diagnosis not present

## 2012-03-08 DIAGNOSIS — I10 Essential (primary) hypertension: Secondary | ICD-10-CM | POA: Diagnosis not present

## 2012-03-08 DIAGNOSIS — IMO0001 Reserved for inherently not codable concepts without codable children: Secondary | ICD-10-CM | POA: Diagnosis not present

## 2012-03-08 DIAGNOSIS — Z7901 Long term (current) use of anticoagulants: Secondary | ICD-10-CM | POA: Diagnosis not present

## 2012-03-08 DIAGNOSIS — Z5181 Encounter for therapeutic drug level monitoring: Secondary | ICD-10-CM | POA: Diagnosis not present

## 2012-03-09 DIAGNOSIS — S82009A Unspecified fracture of unspecified patella, initial encounter for closed fracture: Secondary | ICD-10-CM | POA: Diagnosis not present

## 2012-03-10 DIAGNOSIS — Z7901 Long term (current) use of anticoagulants: Secondary | ICD-10-CM | POA: Diagnosis not present

## 2012-03-10 DIAGNOSIS — I10 Essential (primary) hypertension: Secondary | ICD-10-CM | POA: Diagnosis not present

## 2012-03-10 DIAGNOSIS — Z5181 Encounter for therapeutic drug level monitoring: Secondary | ICD-10-CM | POA: Diagnosis not present

## 2012-03-10 DIAGNOSIS — IMO0001 Reserved for inherently not codable concepts without codable children: Secondary | ICD-10-CM | POA: Diagnosis not present

## 2012-03-14 DIAGNOSIS — IMO0001 Reserved for inherently not codable concepts without codable children: Secondary | ICD-10-CM | POA: Diagnosis not present

## 2012-03-14 DIAGNOSIS — I10 Essential (primary) hypertension: Secondary | ICD-10-CM | POA: Diagnosis not present

## 2012-03-14 DIAGNOSIS — Z5181 Encounter for therapeutic drug level monitoring: Secondary | ICD-10-CM | POA: Diagnosis not present

## 2012-03-14 DIAGNOSIS — Z7901 Long term (current) use of anticoagulants: Secondary | ICD-10-CM | POA: Diagnosis not present

## 2012-03-16 DIAGNOSIS — Z7901 Long term (current) use of anticoagulants: Secondary | ICD-10-CM | POA: Diagnosis not present

## 2012-03-16 DIAGNOSIS — I10 Essential (primary) hypertension: Secondary | ICD-10-CM | POA: Diagnosis not present

## 2012-03-16 DIAGNOSIS — IMO0001 Reserved for inherently not codable concepts without codable children: Secondary | ICD-10-CM | POA: Diagnosis not present

## 2012-03-16 DIAGNOSIS — Z5181 Encounter for therapeutic drug level monitoring: Secondary | ICD-10-CM | POA: Diagnosis not present

## 2012-03-21 DIAGNOSIS — IMO0001 Reserved for inherently not codable concepts without codable children: Secondary | ICD-10-CM | POA: Diagnosis not present

## 2012-03-21 DIAGNOSIS — Z5181 Encounter for therapeutic drug level monitoring: Secondary | ICD-10-CM | POA: Diagnosis not present

## 2012-03-21 DIAGNOSIS — I10 Essential (primary) hypertension: Secondary | ICD-10-CM | POA: Diagnosis not present

## 2012-03-21 DIAGNOSIS — Z7901 Long term (current) use of anticoagulants: Secondary | ICD-10-CM | POA: Diagnosis not present

## 2012-03-25 DIAGNOSIS — I10 Essential (primary) hypertension: Secondary | ICD-10-CM | POA: Diagnosis not present

## 2012-03-25 DIAGNOSIS — IMO0001 Reserved for inherently not codable concepts without codable children: Secondary | ICD-10-CM | POA: Diagnosis not present

## 2012-03-25 DIAGNOSIS — Z5181 Encounter for therapeutic drug level monitoring: Secondary | ICD-10-CM | POA: Diagnosis not present

## 2012-03-25 DIAGNOSIS — Z7901 Long term (current) use of anticoagulants: Secondary | ICD-10-CM | POA: Diagnosis not present

## 2012-03-29 ENCOUNTER — Ambulatory Visit: Payer: Medicare Other | Attending: Orthopedic Surgery | Admitting: Physical Therapy

## 2012-03-29 DIAGNOSIS — M25569 Pain in unspecified knee: Secondary | ICD-10-CM | POA: Diagnosis not present

## 2012-03-29 DIAGNOSIS — R5381 Other malaise: Secondary | ICD-10-CM | POA: Insufficient documentation

## 2012-03-29 DIAGNOSIS — M25669 Stiffness of unspecified knee, not elsewhere classified: Secondary | ICD-10-CM | POA: Diagnosis not present

## 2012-03-29 DIAGNOSIS — R269 Unspecified abnormalities of gait and mobility: Secondary | ICD-10-CM | POA: Diagnosis not present

## 2012-03-29 DIAGNOSIS — IMO0001 Reserved for inherently not codable concepts without codable children: Secondary | ICD-10-CM | POA: Diagnosis not present

## 2012-03-30 ENCOUNTER — Ambulatory Visit: Payer: Medicare Other | Admitting: Physical Therapy

## 2012-04-01 ENCOUNTER — Ambulatory Visit: Payer: Medicare Other | Admitting: *Deleted

## 2012-04-05 ENCOUNTER — Ambulatory Visit: Payer: Medicare Other | Admitting: Physical Therapy

## 2012-04-06 ENCOUNTER — Ambulatory Visit: Payer: Medicare Other | Admitting: Physical Therapy

## 2012-04-08 ENCOUNTER — Ambulatory Visit: Payer: Medicare Other | Admitting: Physical Therapy

## 2012-04-11 ENCOUNTER — Ambulatory Visit: Payer: Medicare Other | Attending: Orthopedic Surgery | Admitting: Physical Therapy

## 2012-04-11 DIAGNOSIS — M25669 Stiffness of unspecified knee, not elsewhere classified: Secondary | ICD-10-CM | POA: Insufficient documentation

## 2012-04-11 DIAGNOSIS — R5381 Other malaise: Secondary | ICD-10-CM | POA: Diagnosis not present

## 2012-04-11 DIAGNOSIS — R269 Unspecified abnormalities of gait and mobility: Secondary | ICD-10-CM | POA: Insufficient documentation

## 2012-04-11 DIAGNOSIS — IMO0001 Reserved for inherently not codable concepts without codable children: Secondary | ICD-10-CM | POA: Insufficient documentation

## 2012-04-11 DIAGNOSIS — M25569 Pain in unspecified knee: Secondary | ICD-10-CM | POA: Insufficient documentation

## 2012-04-13 ENCOUNTER — Ambulatory Visit: Payer: Medicare Other | Admitting: Physical Therapy

## 2012-04-15 ENCOUNTER — Ambulatory Visit: Payer: Medicare Other | Admitting: Physical Therapy

## 2012-04-18 ENCOUNTER — Ambulatory Visit: Payer: Medicare Other | Admitting: Physical Therapy

## 2012-04-20 ENCOUNTER — Ambulatory Visit: Payer: Medicare Other | Admitting: Physical Therapy

## 2012-04-22 ENCOUNTER — Ambulatory Visit: Payer: Medicare Other | Admitting: Physical Therapy

## 2012-05-16 DIAGNOSIS — E039 Hypothyroidism, unspecified: Secondary | ICD-10-CM | POA: Diagnosis not present

## 2012-05-16 DIAGNOSIS — E785 Hyperlipidemia, unspecified: Secondary | ICD-10-CM | POA: Diagnosis not present

## 2012-05-16 DIAGNOSIS — I10 Essential (primary) hypertension: Secondary | ICD-10-CM | POA: Diagnosis not present

## 2012-06-22 DIAGNOSIS — S82009A Unspecified fracture of unspecified patella, initial encounter for closed fracture: Secondary | ICD-10-CM | POA: Diagnosis not present

## 2012-06-22 DIAGNOSIS — Z4789 Encounter for other orthopedic aftercare: Secondary | ICD-10-CM | POA: Diagnosis not present

## 2012-07-22 DIAGNOSIS — Z1212 Encounter for screening for malignant neoplasm of rectum: Secondary | ICD-10-CM | POA: Diagnosis not present

## 2012-08-05 DIAGNOSIS — J40 Bronchitis, not specified as acute or chronic: Secondary | ICD-10-CM | POA: Diagnosis not present

## 2012-08-05 DIAGNOSIS — J019 Acute sinusitis, unspecified: Secondary | ICD-10-CM | POA: Diagnosis not present

## 2012-08-19 DIAGNOSIS — E039 Hypothyroidism, unspecified: Secondary | ICD-10-CM | POA: Diagnosis not present

## 2012-08-19 DIAGNOSIS — E785 Hyperlipidemia, unspecified: Secondary | ICD-10-CM | POA: Diagnosis not present

## 2012-08-19 DIAGNOSIS — I10 Essential (primary) hypertension: Secondary | ICD-10-CM | POA: Diagnosis not present

## 2012-08-19 DIAGNOSIS — Z23 Encounter for immunization: Secondary | ICD-10-CM | POA: Diagnosis not present

## 2012-11-25 DIAGNOSIS — Z1231 Encounter for screening mammogram for malignant neoplasm of breast: Secondary | ICD-10-CM | POA: Diagnosis not present

## 2012-11-28 DIAGNOSIS — E785 Hyperlipidemia, unspecified: Secondary | ICD-10-CM | POA: Diagnosis not present

## 2012-11-28 DIAGNOSIS — E039 Hypothyroidism, unspecified: Secondary | ICD-10-CM | POA: Diagnosis not present

## 2012-11-28 DIAGNOSIS — I10 Essential (primary) hypertension: Secondary | ICD-10-CM | POA: Diagnosis not present

## 2012-11-28 DIAGNOSIS — K589 Irritable bowel syndrome without diarrhea: Secondary | ICD-10-CM | POA: Diagnosis not present

## 2012-12-05 DIAGNOSIS — L820 Inflamed seborrheic keratosis: Secondary | ICD-10-CM | POA: Diagnosis not present

## 2012-12-05 DIAGNOSIS — D235 Other benign neoplasm of skin of trunk: Secondary | ICD-10-CM | POA: Diagnosis not present

## 2012-12-29 DIAGNOSIS — B029 Zoster without complications: Secondary | ICD-10-CM | POA: Diagnosis not present

## 2013-01-23 DIAGNOSIS — B029 Zoster without complications: Secondary | ICD-10-CM | POA: Diagnosis not present

## 2013-03-03 ENCOUNTER — Encounter: Payer: Self-pay | Admitting: Nurse Practitioner

## 2013-03-03 ENCOUNTER — Ambulatory Visit (INDEPENDENT_AMBULATORY_CARE_PROVIDER_SITE_OTHER): Payer: Medicare Other | Admitting: Nurse Practitioner

## 2013-03-03 VITALS — BP 119/67 | HR 75 | Temp 99.4°F | Ht 63.5 in | Wt 176.0 lb

## 2013-03-03 DIAGNOSIS — E039 Hypothyroidism, unspecified: Secondary | ICD-10-CM | POA: Diagnosis not present

## 2013-03-03 DIAGNOSIS — B0229 Other postherpetic nervous system involvement: Secondary | ICD-10-CM

## 2013-03-03 DIAGNOSIS — E785 Hyperlipidemia, unspecified: Secondary | ICD-10-CM

## 2013-03-03 DIAGNOSIS — I10 Essential (primary) hypertension: Secondary | ICD-10-CM | POA: Insufficient documentation

## 2013-03-03 DIAGNOSIS — J309 Allergic rhinitis, unspecified: Secondary | ICD-10-CM

## 2013-03-03 DIAGNOSIS — E1169 Type 2 diabetes mellitus with other specified complication: Secondary | ICD-10-CM | POA: Insufficient documentation

## 2013-03-03 LAB — COMPLETE METABOLIC PANEL WITH GFR
ALT: 24 U/L (ref 0–35)
Alkaline Phosphatase: 52 U/L (ref 39–117)
CO2: 31 mEq/L (ref 19–32)
Creat: 0.99 mg/dL (ref 0.50–1.10)
GFR, Est African American: 67 mL/min
Total Bilirubin: 0.7 mg/dL (ref 0.3–1.2)

## 2013-03-03 LAB — THYROID PANEL WITH TSH
T4, Total: 10.3 ug/dL (ref 5.0–12.5)
TSH: 0.393 u[IU]/mL (ref 0.350–4.500)

## 2013-03-03 MED ORDER — GABAPENTIN 100 MG PO CAPS: 100.0000 mg | ORAL_CAPSULE | Freq: Every evening | ORAL | Status: DC | PRN

## 2013-03-03 MED ORDER — HYDROCHLOROTHIAZIDE 25 MG PO TABS: 25.0000 mg | ORAL_TABLET | Freq: Every day | ORAL | Status: DC

## 2013-03-03 MED ORDER — CETIRIZINE HCL 10 MG PO TABS: 10.0000 mg | ORAL_TABLET | Freq: Every day | ORAL | Status: DC | PRN

## 2013-03-03 MED ORDER — ROSUVASTATIN CALCIUM 10 MG PO TABS: 10.0000 mg | ORAL_TABLET | Freq: Every day | ORAL | Status: DC

## 2013-03-03 MED ORDER — LEVOTHYROXINE SODIUM 125 MCG PO TABS: 125.0000 ug | ORAL_TABLET | Freq: Every day | ORAL | Status: DC

## 2013-03-03 MED ORDER — FENOFIBRATE MICRONIZED 134 MG PO CAPS
134.0000 mg | ORAL_CAPSULE | Freq: Every day | ORAL | Status: DC
Start: 2013-03-03 — End: 2013-06-07

## 2013-03-03 NOTE — Patient Instructions (Signed)

## 2013-03-03 NOTE — Progress Notes (Signed)
Subjective:    Patient ID: Vanessa Hubbard, female    DOB: [birth year], 67 y.o.   MRN: [id number]  Hypertension This is a chronic problem. The current episode started more than 1 year ago. The problem is unchanged. The problem is controlled. Pertinent negatives include no blurred vision, chest pain, headaches, neck pain, palpitations, peripheral edema or shortness of breath. Agents associated with hypertension include thyroid hormones. Risk factors for coronary artery disease include dyslipidemia, obesity and post-menopausal state. Past treatments include diuretics. The current treatment provides significant improvement. Compliance problems include diet and exercise.   Hyperlipidemia This is a chronic problem. The current episode started more than 1 year ago. The problem is controlled. Recent lipid tests were reviewed and are normal. Exacerbating diseases include hypothyroidism and obesity. There are no known factors aggravating her hyperlipidemia. Pertinent negatives include no chest pain, focal sensory loss, focal weakness, leg pain, myalgias or shortness of breath. Current antihyperlipidemic treatment includes fibric acid derivatives. The current treatment provides moderate improvement of lipids. Compliance problems include adherence to diet and adherence to exercise.  Risk factors for coronary artery disease include obesity, post-menopausal and hypertension.  Hypothyroidism Chronic problem. Doing well on medication. No c/o of fatigue Post Herpetic neuralgia Had shingles Feb19, 21014. Rash just recently resolved ut still having pain. NEUROTIN HELPING. Hasn't needed pain meds.   Review of Systems  HENT: Negative for neck pain.   Eyes: Negative for blurred vision.  Respiratory: Negative for shortness of breath.   Cardiovascular: Negative for chest pain and palpitations.  Musculoskeletal: Negative for myalgias.  Neurological: Negative for focal weakness and headaches.  All other systems reviewed  and are negative.       Objective:   Physical Exam  Constitutional: She is oriented to person, place, and time. She appears well-developed and well-nourished.  HENT:  Nose: Nose normal.  Mouth/Throat: Oropharynx is clear and moist.  Eyes: EOM are normal.  Neck: Trachea normal, normal range of motion and full passive range of motion without pain. Neck supple. No JVD present. Carotid bruit is not present. No thyromegaly present.  Cardiovascular: Normal rate, regular rhythm, normal heart sounds and intact distal pulses.  Exam reveals no gallop and no friction rub.   No murmur heard. Pulmonary/Chest: Effort normal and breath sounds normal.  Abdominal: Soft. Bowel sounds are normal. She exhibits no distension and no mass. There is no tenderness.  Musculoskeletal: Normal range of motion.  Lymphadenopathy:    She has no cervical adenopathy.  Neurological: She is alert and oriented to person, place, and time. She has normal reflexes.  Skin: Skin is warm and dry.  herpetic rash resolved left flank  Psychiatric: She has a normal mood and affect. Her behavior is normal. Judgment and thought content normal.   BP 119/67  Pulse 75  Temp(Src) 99.4 F (37.4 C) (Oral)  Ht 5' 3.5" (1.613 m)  Wt 176 lb (79.833 kg)  BMI 30.68 kg/m2        Assessment & Plan:  1. Unspecified hypothyroidism - levothyroxine (SYNTHROID, LEVOTHROID) 125 MCG tablet; Take 1 tablet (125 mcg total) by mouth daily.  Dispense: 90 tablet; Refill: 1 - Thyroid Panel With TSH  2. Hyperlipidemia with target LDL less than 100 Low fat diet and exercise - fenofibrate micronized (LOFIBRA) 134 MG capsule; Take 1 capsule (134 mg total) by mouth daily before breakfast.  Dispense: 90 capsule; Refill: 1 - rosuvastatin (CRESTOR) 10 MG tablet; Take 1 tablet (10 mg total) by mouth daily.  Dispense: 30  tablet; Refill: 3 - NMR Lipoprofile with Lipids  3. Essential hypertension, benign Low NA+ diet - hydrochlorothiazide (HYDRODIURIL)  25 MG tablet; Take 1 tablet (25 mg total) by mouth daily.  Dispense: 90 tablet; Refill: 1 - COMPLETE METABOLIC PANEL WITH GFR  4. Post herpetic neuralgia Moist heat if helps - gabapentin (NEURONTIN) 100 MG capsule; Take 1 capsule (100 mg total) by mouth at bedtime and may repeat dose one time if needed.  Dispense: 30 capsule; Refill: 1  5. Allergic rhinitis Avoid allergens - cetirizine (ZYRTEC) 10 MG tablet; Take 1 tablet (10 mg total) by mouth daily as needed. Allergies.  Dispense: 30 tablet; Refill: 3  Mary-Margaret Daphine Deutscher, FNP

## 2013-03-06 LAB — NMR LIPOPROFILE WITH LIPIDS
Cholesterol, Total: 116 mg/dL (ref <200)
HDL Particle Number: 52.5 umol/L (ref >=30.5)
HDL Size: 9.5 nm (ref >=9.2)
HDL-C: 61 mg/dL (ref >=40)
LDL (calc): 27 mg/dL (ref <100)
LDL Particle Number: 329 nmol/L (ref <1000)
LDL Size: 19.7 nm — ABNORMAL LOW (ref >20.5)
LP-IR Score: 37 (ref <=45)
Large HDL-P: 6.9 umol/L (ref >=4.8)
Large VLDL-P: 2.4 nmol/L (ref <=2.7)
Small LDL Particle Number: 168 nmol/L (ref <=527)
Triglycerides: 141 mg/dL (ref <150)
VLDL Size: 45.7 nm (ref <=46.6)

## 2013-03-27 ENCOUNTER — Encounter: Payer: Self-pay | Admitting: General Practice

## 2013-03-27 ENCOUNTER — Ambulatory Visit (INDEPENDENT_AMBULATORY_CARE_PROVIDER_SITE_OTHER): Payer: Medicare Other | Admitting: General Practice

## 2013-03-27 VITALS — BP 137/73 | HR 72 | Temp 97.9°F | Ht 63.5 in | Wt 174.0 lb

## 2013-03-27 DIAGNOSIS — R109 Unspecified abdominal pain: Secondary | ICD-10-CM | POA: Diagnosis not present

## 2013-03-27 DIAGNOSIS — T148XXA Other injury of unspecified body region, initial encounter: Secondary | ICD-10-CM

## 2013-03-27 LAB — POCT URINALYSIS DIPSTICK
Nitrite, UA: NEGATIVE
Urobilinogen, UA: NEGATIVE
pH, UA: 7.5

## 2013-03-27 LAB — POCT UA - MICROSCOPIC ONLY
Casts, Ur, LPF, POC: NEGATIVE
Mucus, UA: NEGATIVE
Yeast, UA: NEGATIVE

## 2013-03-27 MED ORDER — CYCLOBENZAPRINE HCL 10 MG PO TABS: 10.0000 mg | ORAL_TABLET | Freq: Three times a day (TID) | ORAL | Status: DC | PRN

## 2013-03-27 NOTE — Progress Notes (Signed)
  Subjective:    Patient ID: Vanessa Hubbard, female    DOB: Sep 26, 1946, 67 y.o.   MRN: [id number]  HPI Presents today with right pelvic pain that radiates around to right lower back. Reports the pain onset Thursday. Reports pain started after sitting around in chairs waiting for husband to have cataract surgery. Reports taking OTC pain relievers. Denies ever having similar pain before. Reports working out since knee surgery and last Wednesday increased time on exercise machines. Reports pain is worse when raising right leg into stepping motion.     Review of Systems  Constitutional: Negative for fever and chills.  HENT: Negative for neck pain and neck stiffness.   Respiratory: Negative for cough, chest tightness and shortness of breath.   Cardiovascular: Negative for chest pain and palpitations.  Gastrointestinal: Negative for blood in stool and abdominal distention.  Genitourinary: Negative for difficulty urinating.  Musculoskeletal: Positive for back pain.  Skin: Negative.   Neurological: Negative for dizziness and headaches.  Psychiatric/Behavioral: Negative.        Objective:   Physical Exam  Constitutional: She is oriented to person, place, and time. She appears well-developed and well-nourished.  Cardiovascular: Normal rate, regular rhythm and normal heart sounds.   Pulmonary/Chest: Effort normal and breath sounds normal. No respiratory distress. She exhibits no tenderness.  Abdominal: Soft. Bowel sounds are normal. She exhibits no distension and no mass. There is no tenderness. There is no rebound, no guarding, no CVA tenderness, no tenderness at McBurney's point and negative Murphy's sign.  Musculoskeletal: She exhibits tenderness.  Neurological: She is alert and oriented to person, place, and time.  Oblique (right) tenderness with palpation and lifting leg  Skin: Skin is warm and dry.  Psychiatric: She has a normal mood and affect.   Results for orders placed in visit on  03/27/13  POCT UA - MICROSCOPIC ONLY      Result Value Range   WBC, Ur, HPF, POC 5-8     RBC, urine, microscopic 1-3     Bacteria, U Microscopic few     Mucus, UA neg     Epithelial cells, urine per micros few     Crystals, Ur, HPF, POC neg     Casts, Ur, LPF, POC neg     Yeast, UA neg    POCT URINALYSIS DIPSTICK      Result Value Range   Color, UA yellow     Clarity, UA clear     Glucose, UA neg     Bilirubin, UA neg     Ketones, UA neg     Spec Grav, UA 1.010     Blood, UA large     pH, UA 7.5     Protein, UA neg     Urobilinogen, UA negative     Nitrite, UA neg     Leukocytes, UA small (1+)            Assessment & Plan:  1. Abdominal  pain, other specified site  - POCT UA - Microscopic Only - POCT urinalysis dipstick  2. Muscle strain  - cyclobenzaprine (FLEXERIL) 10 MG tablet; Take 1 tablet (10 mg total) by mouth 3 (three) times daily as needed for muscle spasms.  Dispense: 30 tablet; Refill: 0  Rest affected area Apply heat for 10 minutes three times a day Sedation precautions discussed Patient verbalized understanding Coralie Keens, FNP-C

## 2013-03-27 NOTE — Patient Instructions (Signed)
Muscle Strain °Muscle strain occurs when a muscle is stretched beyond its normal length. A small number of muscle fibers generally are torn. This is especially common in athletes. This happens when a sudden, violent force placed on a muscle stretches it too far. Usually, recovery from muscle strain takes 1 to 2 weeks. Complete healing will take 5 to 6 weeks.  °HOME CARE INSTRUCTIONS  °· While awake, apply ice to the sore muscle for the first 2 days after the injury. °· Put ice in a plastic bag. °· Place a towel between your skin and the bag. °· Leave the ice on for 15 to 20 minutes each hour. °· Do not use the strained muscle for several days, until you no longer have pain. °· You may wrap the injured area with an elastic bandage for comfort. Be careful not to wrap it too tightly. This may interfere with blood circulation or increase swelling. °· Only take over-the-counter or prescription medicines for pain, discomfort, or fever as directed by your caregiver. °SEEK MEDICAL CARE IF:  °You have increasing pain or swelling in the injured area. °MAKE SURE YOU:  °· Understand these instructions. °· Will watch your condition. °· Will get help right away if you are not doing well or get worse. °Document Released: 10/26/2005 Document Revised: 01/18/2012 Document Reviewed: 11/07/2011 °[employer]® Patient Information ©2013 [employer]. ° °

## 2013-05-26 IMAGING — CR DG KNEE COMPLETE 4+V*L*
4 series · 4 of 4 positions shown · non-contrast
Comparison: None.

CLINICAL DATA: Recent fall with knee pain

LEFT KNEE - COMPLETE 4+ VIEW

[x knee ap left]
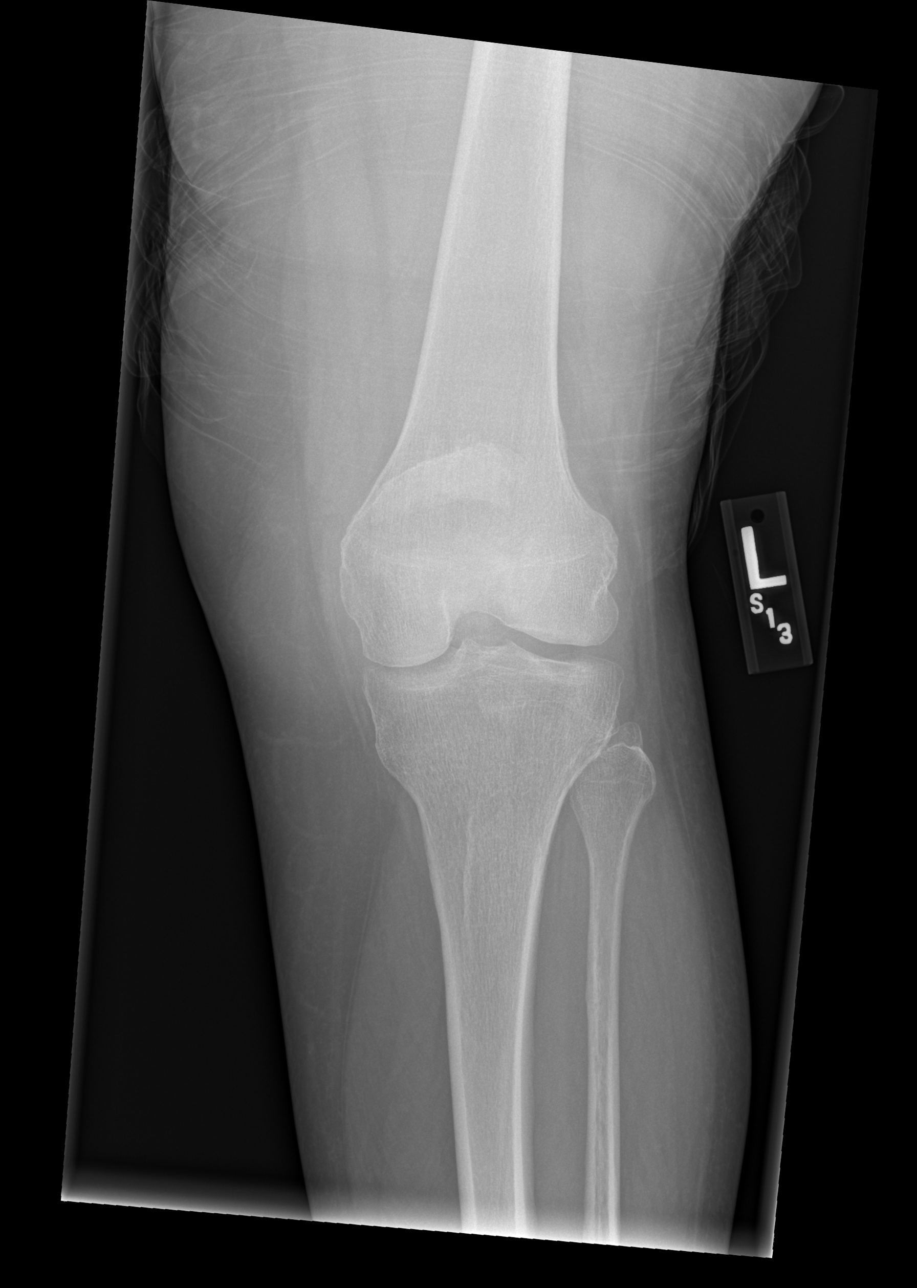

[x knee obl left (1 of 2)]
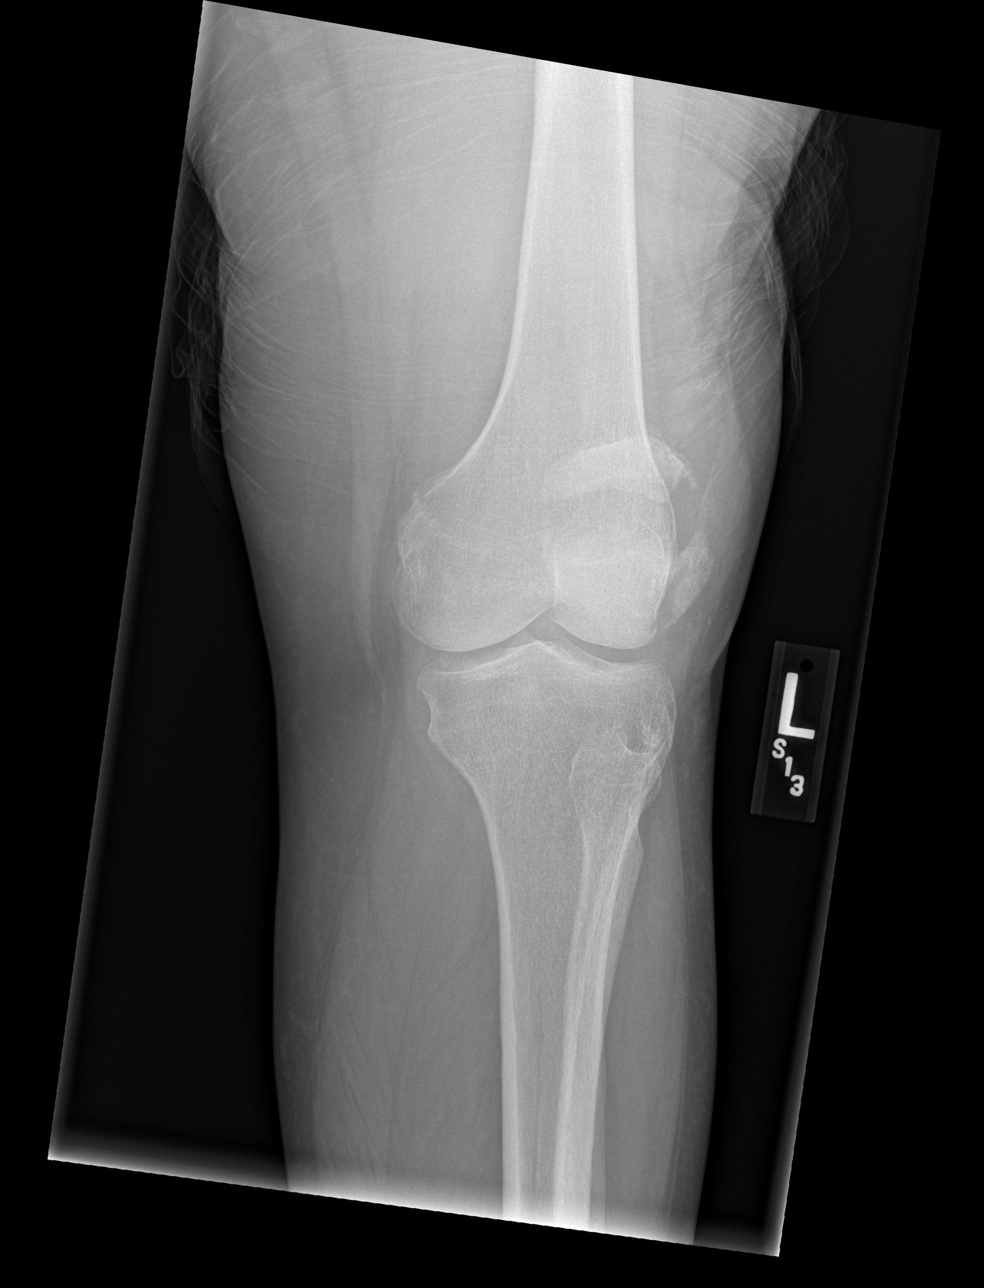

[x knee obl left (2 of 2)]
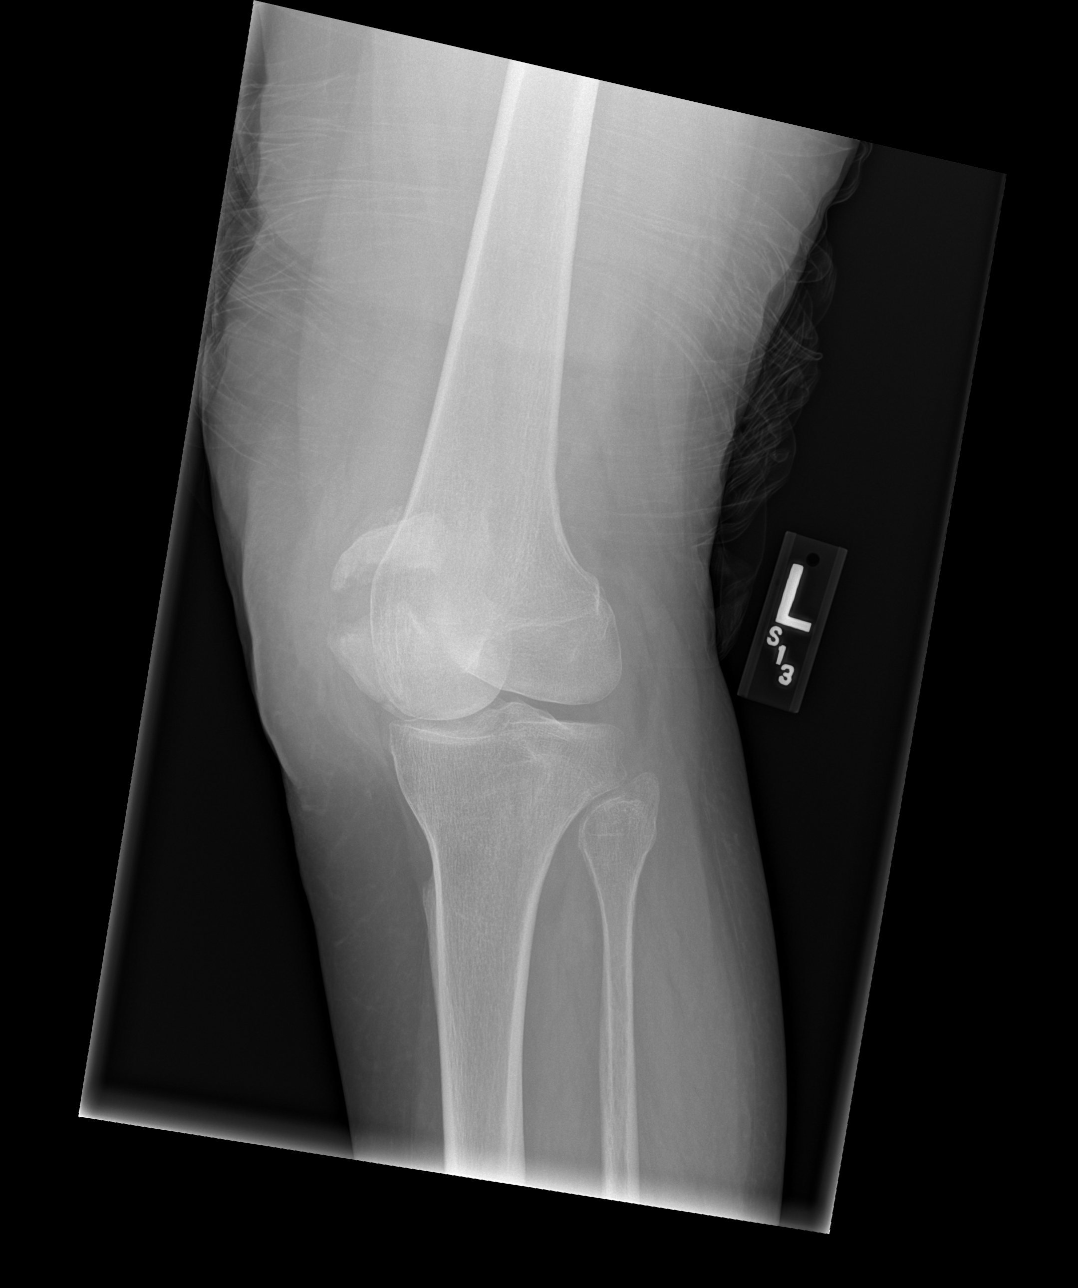

[x knee lat left]
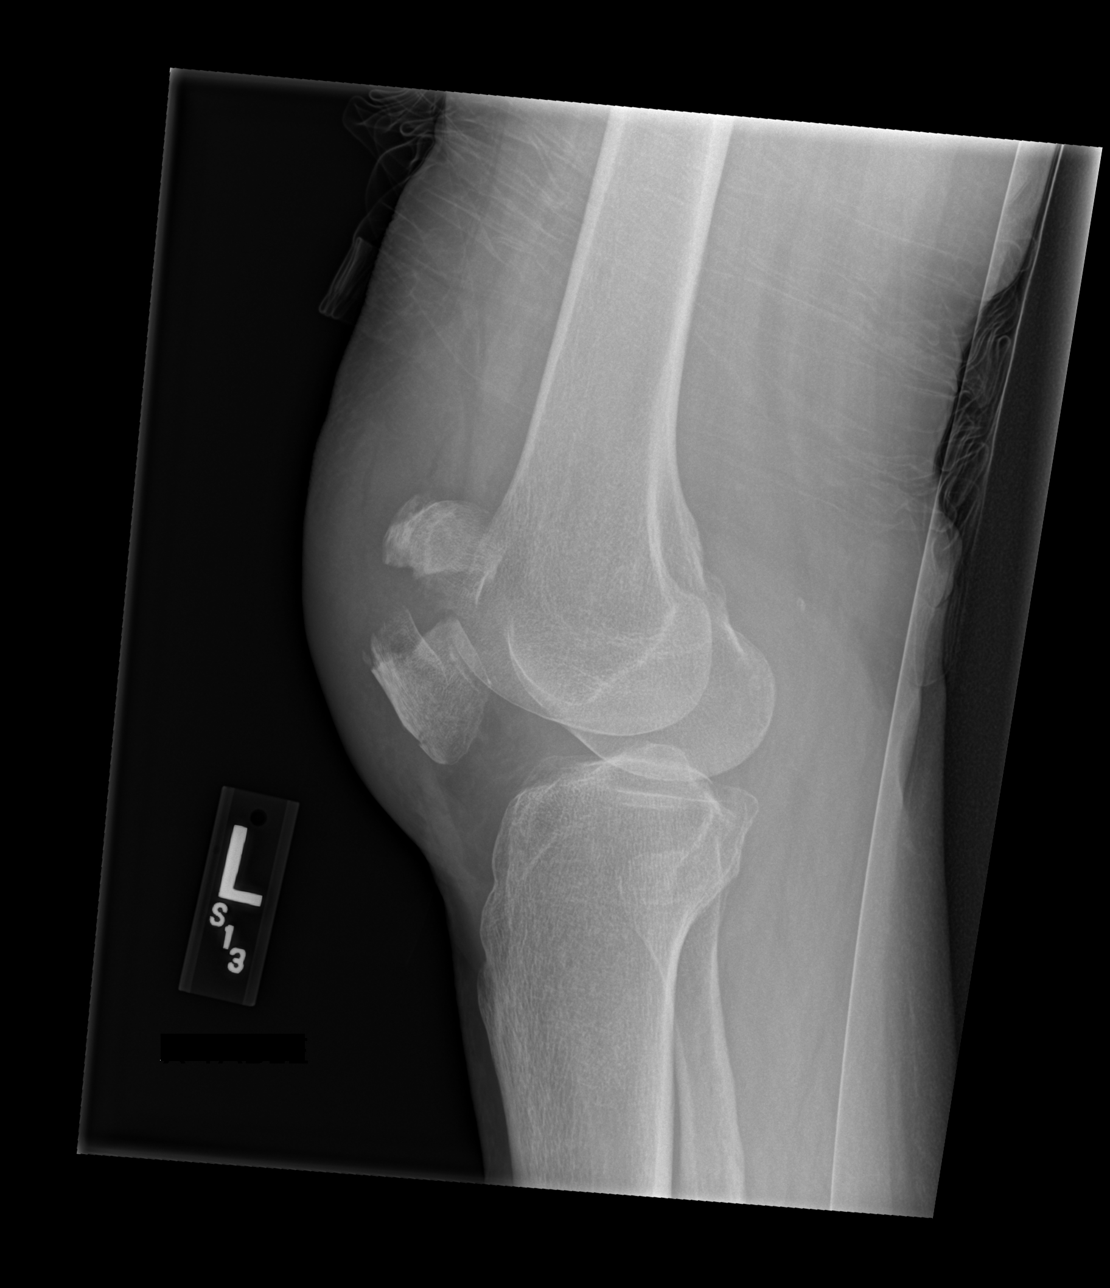

[4 of 4 positions shown; findings below may reference images not displayed]

FINDINGS: There is a transverse fracture through the mid patella
with distraction of the superior fragment cephalad by approximately
14 mm.  There is a joint effusion present.  Some loss of the medial
joint space is noted. There is soft tissue swelling superficially
anteriorly.
IMPRESSION: Transverse distracted fracture of the mid patella with small
effusion.

## 2013-05-26 IMAGING — CR DG KNEE 1-2V PORT*L*
1 series · 2 of 2 positions shown · non-contrast
Comparison: Left knee radiographs performed earlier today at [DATE]
p.m.

CLINICAL DATA: Status post internal fixation of patellar fracture.

PORTABLE LEFT KNEE - 1-2 VIEW

[Series 1: AP · left · 2 of 2 slices shown]
[im 1/2]
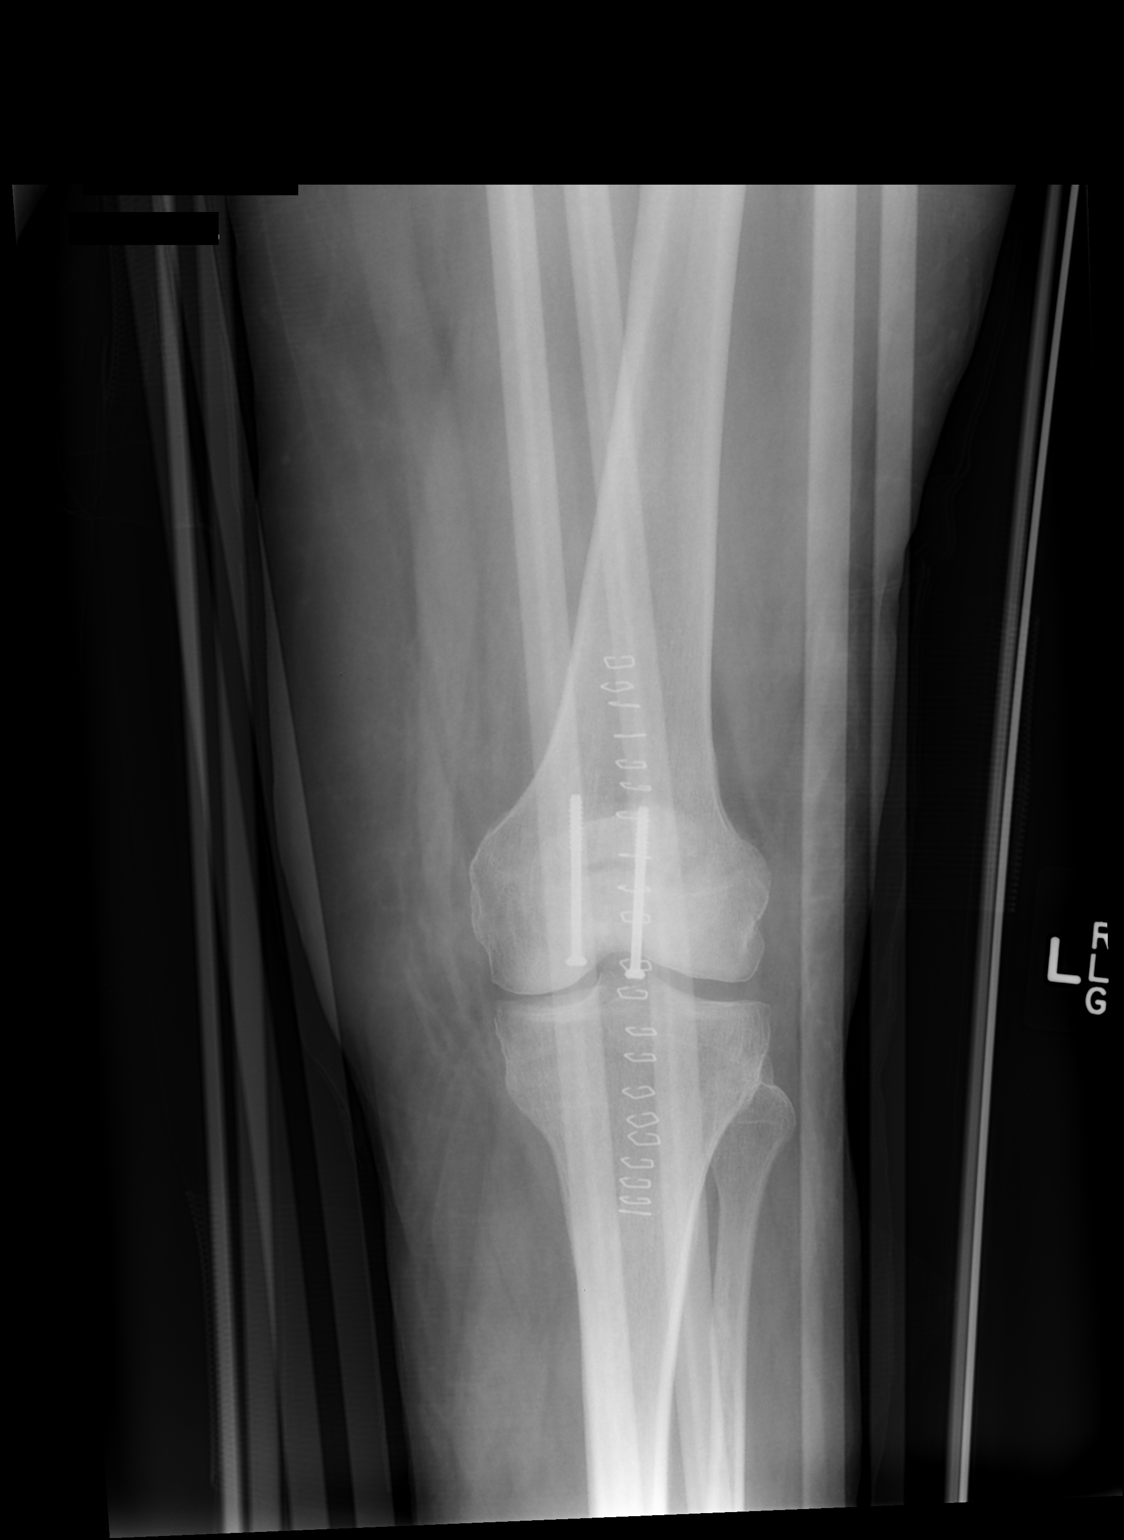
[im 2/2]
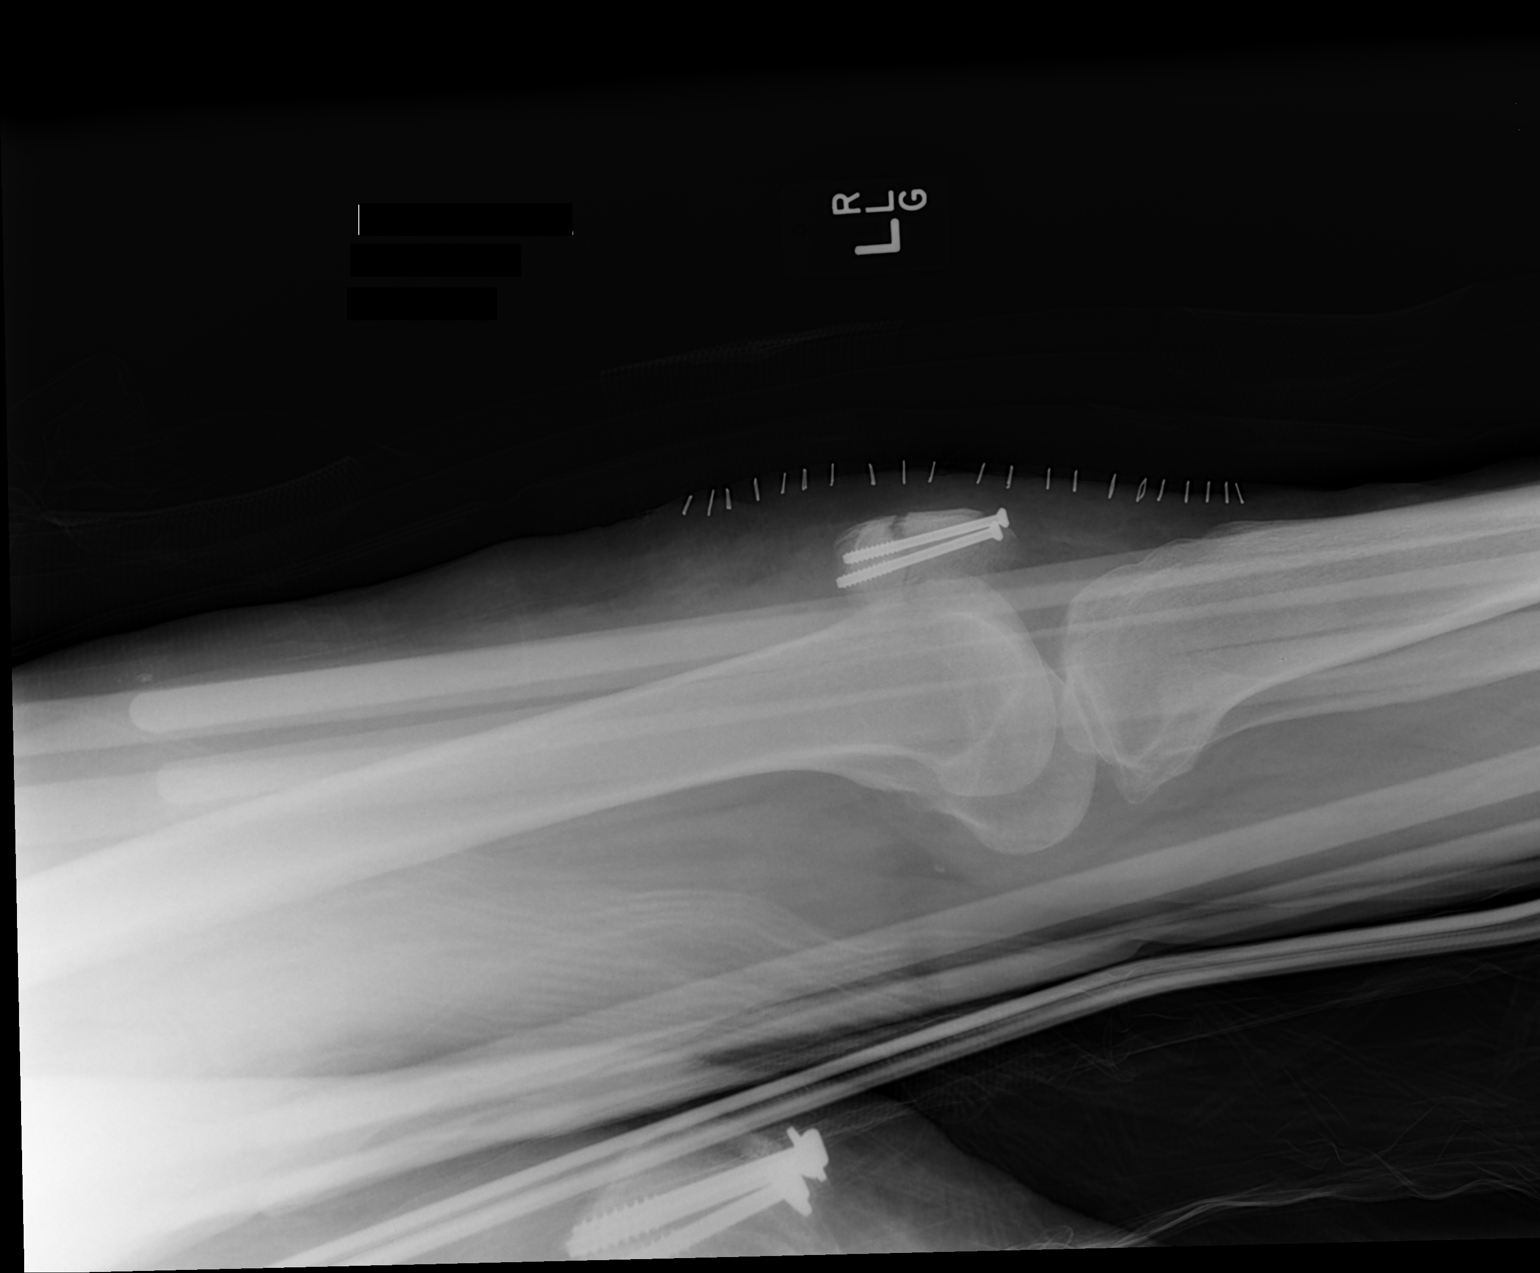

[2 of 2 positions shown; findings below may reference images not displayed]

FINDINGS: There has been interval placement of two screws across
the patellar fracture, seen in near-anatomic alignment.  No new
fractures are seen.

No definite knee joint effusion is characterized, though this is
difficult to assess due to overlying soft tissue swelling.
Overlying skin staples are seen.  Visualized joint spaces are
grossly preserved.
IMPRESSION: Interval internal fixation of patellar fracture, seen in near-
anatomic alignment.

## 2013-06-07 ENCOUNTER — Ambulatory Visit (INDEPENDENT_AMBULATORY_CARE_PROVIDER_SITE_OTHER): Payer: Medicare Other | Admitting: Nurse Practitioner

## 2013-06-07 ENCOUNTER — Encounter: Payer: Self-pay | Admitting: Nurse Practitioner

## 2013-06-07 VITALS — BP 127/66 | HR 81 | Temp 97.3°F | Ht 63.5 in | Wt 175.0 lb

## 2013-06-07 DIAGNOSIS — I10 Essential (primary) hypertension: Secondary | ICD-10-CM | POA: Diagnosis not present

## 2013-06-07 DIAGNOSIS — E785 Hyperlipidemia, unspecified: Secondary | ICD-10-CM

## 2013-06-07 DIAGNOSIS — J309 Allergic rhinitis, unspecified: Secondary | ICD-10-CM

## 2013-06-07 DIAGNOSIS — E039 Hypothyroidism, unspecified: Secondary | ICD-10-CM

## 2013-06-07 MED ORDER — CETIRIZINE HCL 10 MG PO TABS: 10.0000 mg | ORAL_TABLET | Freq: Every day | ORAL | Status: DC | PRN

## 2013-06-07 MED ORDER — HYDROCHLOROTHIAZIDE 25 MG PO TABS: 25.0000 mg | ORAL_TABLET | Freq: Every day | ORAL | Status: DC

## 2013-06-07 MED ORDER — ROSUVASTATIN CALCIUM 10 MG PO TABS: 10.0000 mg | ORAL_TABLET | Freq: Every day | ORAL | Status: DC

## 2013-06-07 MED ORDER — LEVOTHYROXINE SODIUM 125 MCG PO TABS: 125.0000 ug | ORAL_TABLET | Freq: Every day | ORAL | Status: DC

## 2013-06-07 MED ORDER — FENOFIBRATE MICRONIZED 134 MG PO CAPS: 134.0000 mg | ORAL_CAPSULE | Freq: Every day | ORAL | Status: DC

## 2013-06-07 NOTE — Patient Instructions (Addendum)

## 2013-06-07 NOTE — Progress Notes (Signed)
Subjective:    Patient ID: Vanessa Hubbard, female    DOB: 03/22/1946, 67 y.o.   MRN: [id number]  Hyperlipidemia This is a chronic problem. The current episode started more than 1 year ago. The problem is controlled. Recent lipid tests were reviewed and are low. She has no history of diabetes. Pertinent negatives include no leg pain, myalgias or shortness of breath. Current antihyperlipidemic treatment includes statins and fibric acid derivatives. The current treatment provides significant improvement of lipids. Risk factors for coronary artery disease include post-menopausal, hypertension, dyslipidemia and family history.  Hypertension This is a chronic problem. The current episode started more than 1 year ago. The problem has been resolved since onset. The problem is controlled. Pertinent negatives include no anxiety, blurred vision, palpitations, peripheral edema or shortness of breath. Risk factors for coronary artery disease include dyslipidemia, family history, obesity and post-menopausal state. Past treatments include diuretics. The current treatment provides significant improvement. Hypertensive end-organ damage includes a thyroid problem. There is no history of sleep apnea.  Thyroid Problem Presents for follow-up visit. Symptoms include dry skin. Patient reports no anxiety, constipation, depressed mood, hair loss or palpitations. The symptoms have been stable. Her past medical history is significant for hyperlipidemia. There is no history of diabetes.      Review of Systems  Eyes: Negative for blurred vision.  Respiratory: Negative for shortness of breath.   Cardiovascular: Negative for palpitations.  Gastrointestinal: Negative for constipation.  Musculoskeletal: Negative for myalgias.  All other systems reviewed and are negative.       Objective:   Physical Exam  Constitutional: She is oriented to person, place, and time. She appears well-developed and well-nourished.  HENT:   Head: Normocephalic.  Right Ear: External ear normal.  Left Ear: External ear normal.  Mouth/Throat: Oropharynx is clear and moist.  Eyes: Pupils are equal, round, and reactive to light.  Neck: Normal range of motion. Neck supple. No thyromegaly present.  Cardiovascular: Normal rate, regular rhythm, normal heart sounds and intact distal pulses.   Pulmonary/Chest: Effort normal and breath sounds normal.  Abdominal: Soft. Bowel sounds are normal. She exhibits no distension. There is no tenderness.  Musculoskeletal: Normal range of motion. She exhibits no edema and no tenderness.  Neurological: She is alert and oriented to person, place, and time.  Skin: Skin is warm and dry.  Psychiatric: She has a normal mood and affect. Her behavior is normal. Judgment and thought content normal.    BP 127/66  Pulse 81  Temp(Src) 97.3 F (36.3 C) (Oral)  Ht 5' 3.5" (1.613 m)  Wt 175 lb (79.379 kg)  BMI 30.51 kg/m2       Assessment & Plan:  1. Unspecified hypothyroidism  - levothyroxine (SYNTHROID, LEVOTHROID) 125 MCG tablet; Take 1 tablet (125 mcg total) by mouth daily.  Dispense: 90 tablet; Refill: 1  2. Essential hypertension, benign Low NA+ diet - hydrochlorothiazide (HYDRODIURIL) 25 MG tablet; Take 1 tablet (25 mg total) by mouth daily.  Dispense: 90 tablet; Refill: 1 - CMP14+EGFR  3. Hyperlipidemia with target LDL less than 100 Low fat diet and exercise - rosuvastatin (CRESTOR) 10 MG tablet; Take 1 tablet (10 mg total) by mouth daily.  Dispense: 30 tablet; Refill: 5 - fenofibrate micronized (LOFIBRA) 134 MG capsule; Take 1 capsule (134 mg total) by mouth daily before breakfast.  Dispense: 90 capsule; Refill: 1 - NMR, lipoprofile  4. Allergic rhinitis Avoid allergens - cetirizine (ZYRTEC) 10 MG tablet; Take 1 tablet (10 mg total) by mouth  daily as needed. Allergies.  Dispense: 30 tablet; Refill: 5   Mary-Margaret Daphine Deutscher, FNP

## 2013-06-13 LAB — CMP14+EGFR
AST: 24 IU/L (ref 0–40)
Alkaline Phosphatase: 62 IU/L (ref 39–117)
BUN/Creatinine Ratio: 16 (ref 11–26)
CO2: 28 mmol/L (ref 18–29)
Chloride: 103 mmol/L (ref 97–108)
Globulin, Total: 2.2 g/dL (ref 1.5–4.5)
Sodium: 143 mmol/L (ref 134–144)

## 2013-06-13 LAB — NMR, LIPOPROFILE
HDL Particle Number: 55.8 umol/L (ref >=30.5)
LDL Particle Number: 644 nmol/L (ref <1000)
LDL Size: 21 nm (ref >20.5)
LDLC SERPL CALC-MCNC: 24 mg/dL (ref <100)
LP-IR Score: 51 — ABNORMAL HIGH (ref <=45)

## 2013-09-08 ENCOUNTER — Ambulatory Visit (INDEPENDENT_AMBULATORY_CARE_PROVIDER_SITE_OTHER): Payer: Medicare Other | Admitting: Nurse Practitioner

## 2013-09-08 ENCOUNTER — Encounter: Payer: Self-pay | Admitting: Nurse Practitioner

## 2013-09-08 VITALS — BP 135/71 | HR 79 | Temp 97.8°F | Ht 63.5 in | Wt 178.0 lb

## 2013-09-08 DIAGNOSIS — E039 Hypothyroidism, unspecified: Secondary | ICD-10-CM | POA: Diagnosis not present

## 2013-09-08 DIAGNOSIS — Z23 Encounter for immunization: Secondary | ICD-10-CM | POA: Diagnosis not present

## 2013-09-08 DIAGNOSIS — E785 Hyperlipidemia, unspecified: Secondary | ICD-10-CM

## 2013-09-08 DIAGNOSIS — I10 Essential (primary) hypertension: Secondary | ICD-10-CM

## 2013-09-08 MED ORDER — LEVOTHYROXINE SODIUM 125 MCG PO TABS: 125.0000 ug | ORAL_TABLET | Freq: Every day | ORAL | Status: DC

## 2013-09-08 MED ORDER — FENOFIBRATE MICRONIZED 134 MG PO CAPS: 134.0000 mg | ORAL_CAPSULE | Freq: Every day | ORAL | Status: DC

## 2013-09-08 NOTE — Progress Notes (Signed)
Subjective:    Patient ID: Vanessa Hubbard, female    DOB: Oct 31, 1946, 67 y.o.   MRN: [id number]  Hyperlipidemia This is a chronic problem. The current episode started more than 1 year ago. The problem is controlled. Recent lipid tests were reviewed and are low. She has no history of diabetes. There are no known factors aggravating her hyperlipidemia. Pertinent negatives include no leg pain, myalgias or shortness of breath. Current antihyperlipidemic treatment includes statins and fibric acid derivatives. The current treatment provides significant improvement of lipids. Risk factors for coronary artery disease include post-menopausal, hypertension, dyslipidemia and family history.  Hypertension This is a chronic problem. The current episode started more than 1 year ago. The problem has been resolved since onset. The problem is controlled. Pertinent negatives include no anxiety, blurred vision, palpitations, peripheral edema or shortness of breath. Risk factors for coronary artery disease include dyslipidemia, family history, obesity and post-menopausal state. Past treatments include diuretics. The current treatment provides significant improvement. Hypertensive end-organ damage includes a thyroid problem. There is no history of sleep apnea.  Thyroid Problem Presents for follow-up visit. Symptoms include dry skin. Patient reports no anxiety, constipation, depressed mood, hair loss or palpitations. The symptoms have been stable. Her past medical history is significant for hyperlipidemia. There is no history of diabetes or obesity.      Review of Systems  Eyes: Negative for blurred vision.  Respiratory: Negative for shortness of breath.   Cardiovascular: Negative for palpitations.  Gastrointestinal: Negative for constipation.  Musculoskeletal: Negative for myalgias.  All other systems reviewed and are negative.       Objective:   Physical Exam  Vitals reviewed. Constitutional: She is  oriented to person, place, and time. She appears well-developed and well-nourished.  HENT:  Head: Normocephalic.  Right Ear: External ear normal.  Left Ear: External ear normal.  Mouth/Throat: Oropharynx is clear and moist.  Eyes: Pupils are equal, round, and reactive to light.  Neck: Normal range of motion. Neck supple. No thyromegaly present.  Cardiovascular: Normal rate, regular rhythm, normal heart sounds and intact distal pulses.   Pulmonary/Chest: Effort normal and breath sounds normal.  Abdominal: Soft. Bowel sounds are normal. She exhibits no distension. There is no tenderness.  Musculoskeletal: Normal range of motion. She exhibits no edema and no tenderness.  Neurological: She is alert and oriented to person, place, and time.  Skin: Skin is warm and dry. Rash noted. There is erythema.  Small localized rash around neck- Pt states it appeared at the first of Oct.  Psychiatric: She has a normal mood and affect. Her behavior is normal. Judgment and thought content normal.    BP 135/71  Pulse 79  Temp(Src) 97.8 F (36.6 C) (Oral)  Ht 5' 3.5" (1.613 m)  Wt 178 lb (80.74 kg)  BMI 31.03 kg/m2       Assessment & Plan:   1. Hyperlipidemia LDL goal < 100   2. Hypertension   3. Hypothyroid   4. Unspecified hypothyroidism   5. Hyperlipidemia with target LDL less than 100    Orders Placed This Encounter  Procedures  . CMP14+EGFR  . NMR, lipoprofile  . Thyroid Panel With TSH   Meds ordered this encounter  Medications  . levothyroxine (SYNTHROID, LEVOTHROID) 125 MCG tablet    Sig: Take 1 tablet (125 mcg total) by mouth daily.    Dispense:  90 tablet    Refill:  1    Order Specific Question:  Supervising Provider    Answer:  Rudi Heap W [[id number]]  . fenofibrate micronized (LOFIBRA) 134 MG capsule    Sig: Take 1 capsule (134 mg total) by mouth daily before breakfast.    Dispense:  90 capsule    Refill:  1    Order Specific Question:  Supervising Provider    Answer:   Deborra Medina    Continue all meds Labs pending Diet and exercise encouraged Health maintenance reviewed Follow up in 3 months  Mary-Margaret Daphine Deutscher, FNP

## 2013-09-08 NOTE — Patient Instructions (Signed)

## 2013-09-10 LAB — CMP14+EGFR
ALT: 21 IU/L (ref 0–32)
BUN/Creatinine Ratio: 15 (ref 11–26)
CO2: 32 mmol/L — ABNORMAL HIGH (ref 18–29)
Calcium: 10.1 mg/dL (ref 8.6–10.2)
Chloride: 100 mmol/L (ref 97–108)
GFR calc non Af Amer: 70 mL/min/{1.73_m2} (ref >59)
Glucose: 91 mg/dL (ref 65–99)
Potassium: 4.2 mmol/L (ref 3.5–5.2)
Total Protein: 6.3 g/dL (ref 6.0–8.5)

## 2013-09-10 LAB — NMR, LIPOPROFILE
HDL Cholesterol by NMR: 64 mg/dL (ref >=40)
LDL Particle Number: 392 nmol/L (ref <1000)
LDLC SERPL CALC-MCNC: 22 mg/dL (ref <100)
Triglycerides by NMR: 131 mg/dL (ref <150)

## 2013-09-10 LAB — THYROID PANEL WITH TSH
Free Thyroxine Index: 3 (ref 1.2–4.9)
T3 Uptake Ratio: 28 % (ref 24–39)
T4, Total: 10.6 ug/dL (ref 4.5–12.0)

## 2013-10-30 ENCOUNTER — Ambulatory Visit (INDEPENDENT_AMBULATORY_CARE_PROVIDER_SITE_OTHER): Payer: Medicare Other | Admitting: General Practice

## 2013-10-30 VITALS — BP 112/62 | HR 79 | Temp 98.6°F | Ht 63.0 in | Wt 178.0 lb

## 2013-10-30 DIAGNOSIS — J069 Acute upper respiratory infection, unspecified: Secondary | ICD-10-CM | POA: Diagnosis not present

## 2013-10-30 DIAGNOSIS — R05 Cough: Secondary | ICD-10-CM

## 2013-10-30 MED ORDER — BENZONATATE 100 MG PO CAPS: 100.0000 mg | ORAL_CAPSULE | Freq: Three times a day (TID) | ORAL | Status: DC | PRN

## 2013-10-30 MED ORDER — METHYLPREDNISOLONE ACETATE 80 MG/ML IJ SUSP
80.0000 mg | Freq: Once | INTRAMUSCULAR | Status: AC
  Administered 2013-10-30: 80 mg via INTRAMUSCULAR

## 2013-10-30 MED ORDER — AZITHROMYCIN 250 MG PO TABS: ORAL_TABLET | ORAL | Status: DC

## 2013-10-30 NOTE — Progress Notes (Signed)
   Subjective:    Patient ID: Vanessa Hubbard, female    DOB: 12-22-1945, 67 y.o.   MRN: [id number]  Cough This is a new problem. The current episode started in the past 7 days. The problem has been unchanged. The cough is non-productive. Associated symptoms include chills, nasal congestion and postnasal drip. Pertinent negatives include no chest pain, fever, headaches, sore throat, shortness of breath or wheezing. The symptoms are aggravated by lying down. She has tried OTC cough suppressant for the symptoms. Her past medical history is significant for bronchitis. There is no history of asthma or pneumonia.      Review of Systems  Constitutional: Positive for chills. Negative for fever.  HENT: Positive for congestion and postnasal drip. Negative for sore throat.   Respiratory: Positive for cough. Negative for chest tightness, shortness of breath and wheezing.   Cardiovascular: Negative for chest pain and palpitations.  Neurological: Negative for dizziness, weakness and headaches.  All other systems reviewed and are negative.       Objective:   Physical Exam  Constitutional: She is oriented to person, place, and time. She appears well-developed and well-nourished.  HENT:  Head: Normocephalic and atraumatic.  Right Ear: External ear normal.  Left Ear: External ear normal.  Pulmonary/Chest: Effort normal and breath sounds normal. No respiratory distress. She exhibits no tenderness.  Neurological: She is alert and oriented to person, place, and time.  Skin: Skin is warm and dry.  Psychiatric: She has a normal mood and affect.          Assessment & Plan:  1. Upper respiratory infection  - azithromycin (ZITHROMAX) 250 MG tablet; Take as directed  Dispense: 6 tablet; Refill: 0  2. Cough  - methylPREDNISolone acetate (DEPO-MEDROL) injection 80 mg; Inject 1 mL (80 mg total) into the muscle once. - benzonatate (TESSALON PERLES) 100 MG capsule; Take 1 capsule (100 mg total) by  mouth 3 (three) times daily as needed for cough.  Dispense: 30 capsule; Refill: 0 -adequate fluids -avoid irritants -RTO if symptoms worsen or unresolved -Patient verbalized understanding Coralie Keens, FNP-C

## 2013-10-30 NOTE — Patient Instructions (Signed)

## 2013-12-05 DIAGNOSIS — Z1231 Encounter for screening mammogram for malignant neoplasm of breast: Secondary | ICD-10-CM | POA: Diagnosis not present

## 2013-12-13 ENCOUNTER — Encounter: Payer: Self-pay | Admitting: Nurse Practitioner

## 2013-12-13 ENCOUNTER — Ambulatory Visit (INDEPENDENT_AMBULATORY_CARE_PROVIDER_SITE_OTHER): Payer: Medicare Other | Admitting: Nurse Practitioner

## 2013-12-13 VITALS — BP 125/70 | HR 73 | Temp 96.7°F | Ht 63.0 in | Wt 175.0 lb

## 2013-12-13 DIAGNOSIS — I10 Essential (primary) hypertension: Secondary | ICD-10-CM | POA: Diagnosis not present

## 2013-12-13 DIAGNOSIS — E039 Hypothyroidism, unspecified: Secondary | ICD-10-CM | POA: Diagnosis not present

## 2013-12-13 DIAGNOSIS — E785 Hyperlipidemia, unspecified: Secondary | ICD-10-CM

## 2013-12-13 DIAGNOSIS — Z13820 Encounter for screening for osteoporosis: Secondary | ICD-10-CM

## 2013-12-13 DIAGNOSIS — B0229 Other postherpetic nervous system involvement: Secondary | ICD-10-CM

## 2013-12-13 MED ORDER — HYDROCHLOROTHIAZIDE 25 MG PO TABS: 25.0000 mg | ORAL_TABLET | Freq: Every day | ORAL | Status: DC

## 2013-12-13 NOTE — Progress Notes (Signed)
Subjective:    Patient ID: Vanessa Hubbard, female    DOB: 1946-01-15, 68 y.o.   MRN: [id number]  Patient here today for follow up of chronic medical problems- no changes since last visit.  Hyperlipidemia This is a chronic problem. The current episode started more than 1 year ago. The problem is controlled. Recent lipid tests were reviewed and are low. She has no history of diabetes. There are no known factors aggravating her hyperlipidemia. Pertinent negatives include no leg pain, myalgias or shortness of breath. Current antihyperlipidemic treatment includes statins and fibric acid derivatives. The current treatment provides significant improvement of lipids. Risk factors for coronary artery disease include post-menopausal, hypertension, dyslipidemia and family history.  Hypertension This is a chronic problem. The current episode started more than 1 year ago. The problem has been resolved since onset. The problem is controlled. Pertinent negatives include no anxiety, blurred vision, palpitations, peripheral edema or shortness of breath. Risk factors for coronary artery disease include dyslipidemia, family history, obesity and post-menopausal state. Past treatments include diuretics. The current treatment provides significant improvement. Hypertensive end-organ damage includes a thyroid problem. There is no history of sleep apnea.  Thyroid Problem Presents for follow-up visit. Symptoms include dry skin. Patient reports no anxiety, constipation, depressed mood, hair loss or palpitations. The symptoms have been stable. Her past medical history is significant for hyperlipidemia. There is no history of diabetes or obesity.      Review of Systems  Eyes: Negative for blurred vision.  Respiratory: Negative for shortness of breath.   Cardiovascular: Negative for palpitations.  Gastrointestinal: Negative for constipation.  Musculoskeletal: Negative for myalgias.  All other systems reviewed and are  negative.       Objective:   Physical Exam  Vitals reviewed. Constitutional: She is oriented to person, place, and time. She appears well-developed and well-nourished.  HENT:  Head: Normocephalic.  Right Ear: External ear normal.  Left Ear: External ear normal.  Mouth/Throat: Oropharynx is clear and moist.  Eyes: Pupils are equal, round, and reactive to light.  Neck: Normal range of motion. Neck supple. No thyromegaly present.  Cardiovascular: Normal rate, regular rhythm, normal heart sounds and intact distal pulses.   Pulmonary/Chest: Effort normal and breath sounds normal.  Abdominal: Soft. Bowel sounds are normal. She exhibits no distension. There is no tenderness.  Musculoskeletal: Normal range of motion. She exhibits no edema and no tenderness.  Neurological: She is alert and oriented to person, place, and time.  Skin: Skin is warm and dry.  Psychiatric: She has a normal mood and affect. Her behavior is normal. Judgment and thought content normal.    BP 125/70  Pulse 73  Temp(Src) 96.7 F (35.9 C) (Oral)  Ht _0  (1.6 m)  Wt 175 lb (79.379 kg)  BMI 31.01 kg/m2       Assessment & Plan:   1. Hyperlipidemia with target LDL less than 100   2. Unspecified hypothyroidism   3. Post herpetic neuralgia   4. Essential hypertension, benign   5. Screening for osteoporosis    Orders Placed This Encounter  Procedures  . DG Bone Density    Standing Status: Future     Number of Occurrences:      Standing Expiration Date: 02/12/2015    Order Specific Question:  Reason for Exam (SYMPTOM  OR DIAGNOSIS REQUIRED)    Answer:  screening    Order Specific Question:  Preferred imaging location?    Answer:  Internal  . CMP14+EGFR  . NMR,  lipoprofile   Meds ordered this encounter  Medications  . hydrochlorothiazide (HYDRODIURIL) 25 MG tablet    Sig: Take 1 tablet (25 mg total) by mouth daily.    Dispense:  90 tablet    Refill:  1    Order Specific Question:  Supervising  Provider    Answer:  Chipper Herb [[id number]]    Labs pending Health maintenance reviewed Diet and exercise encouraged Continue all meds Follow up  In 3 months   [place], FNP

## 2013-12-13 NOTE — Patient Instructions (Signed)
Bone Densitometry Bone densitometry is a special X-ray that measures your bone density and can be used to help predict your risk of bone fractures. This test is used to determine bone mineral content and density to diagnose osteoporosis. Osteoporosis is the loss of bone that may cause the bone to become weak. Osteoporosis commonly occurs in women entering menopause. However, it may be found in men and in people with other diseases. PREPARATION FOR TEST No preparation necessary. WHO SHOULD BE TESTED?  All women older than 65.  Postmenopausal women (50 to 65) with risk factors for osteoporosis.  People with a previous fracture caused by normal activities.  People with a small body frame (less than 127 poundsor a body mass index [BMI] of less than 21).  People who have a parent with a hip fracture or history of osteoporosis.  People who smoke.  People who have rheumatoid arthritis.  Anyone who engages in excessive alcohol use (more than 3 drinks most days).  Women who experience early menopause. WHEN SHOULD YOU BE RETESTED? Current guidelines suggest that you should wait at least 2 years before doing a bone density test again if your first test was normal.Recent studies indicated that women with normal bone density may be able to wait a few years before needing to repeat a bone density test. You should discuss this with your caregiver.  NORMAL FINDINGS   Normal: less than standard deviation below normal (greater than -1).  Osteopenia: 1 to 2.5 standard deviations below normal (-1 to -2.5).  Osteoporosis: greater than 2.5 standard deviations below normal (less than -2.5). Test results are reported as a "T score" and a "Z score."The T score is a number that compares your bone density with the bone density of healthy, young women.The Z score is a number that compares your bone density with the scores of women who are the same age, gender, and race.  Ranges for normal findings may vary  among different laboratories and hospitals. You should always check with your doctor after having lab work or other tests done to discuss the meaning of your test results and whether your values are considered within normal limits. MEANING OF TEST  Your caregiver will go over the test results with you and discuss the importance and meaning of your results, as well as treatment options and the need for additional tests if necessary. OBTAINING THE TEST RESULTS It is your responsibility to obtain your test results. Ask the lab or department performing the test when and how you will get your results. Document Released: 11/17/2004 Document Revised: 01/18/2012 Document Reviewed: 12/10/2010 [employer] Patient Information 2014 [employer].  

## 2013-12-14 LAB — NMR, LIPOPROFILE
Cholesterol: 124 mg/dL (ref <200)
HDL CHOLESTEROL BY NMR: 80 mg/dL (ref >=40)
HDL PARTICLE NUMBER: 59.1 umol/L (ref >=30.5)
LDL Particle Number: 351 nmol/L (ref <1000)
LDL Size: 20.5 nm — ABNORMAL LOW (ref >20.5)
LDLC SERPL CALC-MCNC: 27 mg/dL (ref <100)
LP-IR Score: 39 (ref <=45)
SMALL LDL PARTICLE NUMBER: 211 nmol/L (ref <=527)
Triglycerides by NMR: 85 mg/dL (ref <150)

## 2013-12-14 LAB — CMP14+EGFR
A/G RATIO: 2.5 (ref 1.1–2.5)
ALT: 21 IU/L (ref 0–32)
AST: 20 IU/L (ref 0–40)
Albumin: 4.7 g/dL (ref 3.6–4.8)
Alkaline Phosphatase: 61 IU/L (ref 39–117)
BILIRUBIN TOTAL: 0.6 mg/dL (ref 0.0–1.2)
BUN / CREAT RATIO: 20 (ref 11–26)
BUN: 16 mg/dL (ref 8–27)
CO2: 25 mmol/L (ref 18–29)
Calcium: 10.4 mg/dL — ABNORMAL HIGH (ref 8.7–10.3)
Chloride: 99 mmol/L (ref 97–108)
Creatinine, Ser: 0.79 mg/dL (ref 0.57–1.00)
GFR, EST AFRICAN AMERICAN: 88 mL/min/{1.73_m2} (ref >59)
GFR, EST NON AFRICAN AMERICAN: 77 mL/min/{1.73_m2} (ref >59)
Globulin, Total: 1.9 g/dL (ref 1.5–4.5)
Glucose: 78 mg/dL (ref 65–99)
POTASSIUM: 4.4 mmol/L (ref 3.5–5.2)
SODIUM: 143 mmol/L (ref 134–144)
Total Protein: 6.6 g/dL (ref 6.0–8.5)

## 2013-12-27 ENCOUNTER — Encounter: Payer: Medicare Other | Admitting: Physical Therapy

## 2014-01-03 ENCOUNTER — Ambulatory Visit (INDEPENDENT_AMBULATORY_CARE_PROVIDER_SITE_OTHER): Payer: Medicare Other | Admitting: *Deleted

## 2014-01-03 DIAGNOSIS — Z23 Encounter for immunization: Secondary | ICD-10-CM

## 2014-01-03 NOTE — Patient Instructions (Signed)
Herpes Zoster Virus Vaccine What is this medicine? HERPES ZOSTER VIRUS VACCINE (HUR peez ZOS ter vahy ruhs vak SEEN) is a vaccine. It is used to prevent shingles in adults 70 years old and over. This vaccine is not used to treat shingles or nerve pain from shingles. This medicine may be used for other purposes; ask your health care provider or pharmacist if you have questions. COMMON BRAND NAME(S): Zostavax What should I tell my health care provider before I take this medicine? They need to know if you have any of these conditions: -cancer like leukemia or lymphoma -immune system problems or therapy -infection with fever -tuberculosis -an unusual or allergic reaction to vaccines, neomycin, gelatin, other medicines, foods, dyes, or preservatives -pregnant or trying to get pregnant -breast-feeding How should I use this medicine? This vaccine is for injection under the skin. It is given by a health care professional. Talk to your pediatrician regarding the use of this medicine in children. This medicine is not approved for use in children. Overdosage: If you think you have taken too much of this medicine contact a poison control center or emergency room at once. NOTE: This medicine is only for you. Do not share this medicine with others. What if I miss a dose? This does not apply. What may interact with this medicine? Do not take this medicine with any of the following medications: -adalimumab -anakinra -etanercept -infliximab -medicines to treat cancer -medicines that suppress your immune system This medicine may also interact with the following medications: -immunoglobulins -steroid medicines like prednisone or cortisone This list may not describe all possible interactions. Give your health care provider a list of all the medicines, herbs, non-prescription drugs, or dietary supplements you use. Also tell them if you smoke, drink alcohol, or use illegal drugs. Some items may interact  with your medicine. What should I watch for while using this medicine? Visit your doctor for regular check ups. This vaccine, like all vaccines, may not fully protect everyone. After receiving this vaccine it may be possible to pass chickenpox infection to others. Avoid people with immune system problems, pregnant women who have not had chickenpox, and newborns of women who have not had chickenpox. Talk to your doctor for more information. What side effects may I notice from receiving this medicine? Side effects that you should report to your doctor or health care professional as soon as possible: -allergic reactions like skin rash, itching or hives, swelling of the face, lips, or tongue -breathing problems -feeling faint or lightheaded, falls -fever, flu-like symptoms -pain, tingling, numbness in the hands or feet -swelling of the ankles, feet, hands -unusually weak or tired Side effects that usually do not require medical attention (report to your doctor or health care professional if they continue or are bothersome): -aches or pains -chickenpox-like rash -diarrhea -headache -loss of appetite -nausea, vomiting -redness, pain, swelling at site where injected -runny nose This list may not describe all possible side effects. Call your doctor for medical advice about side effects. You may report side effects to FDA at 1-800-FDA-1088. Where should I keep my medicine? This drug is given in a hospital or clinic and will not be stored at home. NOTE: This sheet is a summary. It may not cover all possible information. If you have questions about this medicine, talk to your doctor, pharmacist, or health care provider.  2014, [employer]. (2010-04-14 17:43:50)  

## 2014-01-03 NOTE — Progress Notes (Signed)
Patient ID: Vanessa Hubbard, female   DOB: 26-Dec-1945, 68 y.o.   MRN: [id number] Patient tolerated injesction well. We will monitor patient for approx 20 mins.

## 2014-01-17 ENCOUNTER — Ambulatory Visit (INDEPENDENT_AMBULATORY_CARE_PROVIDER_SITE_OTHER): Payer: Medicare Other | Admitting: Pharmacist

## 2014-01-17 ENCOUNTER — Ambulatory Visit (INDEPENDENT_AMBULATORY_CARE_PROVIDER_SITE_OTHER): Payer: Medicare Other

## 2014-01-17 ENCOUNTER — Encounter: Payer: Self-pay | Admitting: Pharmacist

## 2014-01-17 VITALS — BP 128/72 | HR 80 | Ht 63.0 in | Wt 182.5 lb

## 2014-01-17 DIAGNOSIS — Z13820 Encounter for screening for osteoporosis: Secondary | ICD-10-CM

## 2014-01-17 DIAGNOSIS — Z0000 Encounter for general adult medical examination without abnormal findings: Secondary | ICD-10-CM | POA: Diagnosis not present

## 2014-01-17 DIAGNOSIS — M8588 Other specified disorders of bone density and structure, other site: Secondary | ICD-10-CM | POA: Insufficient documentation

## 2014-01-17 DIAGNOSIS — M85852 Other specified disorders of bone density and structure, left thigh: Secondary | ICD-10-CM | POA: Insufficient documentation

## 2014-01-17 DIAGNOSIS — M8580 Other specified disorders of bone density and structure, unspecified site: Secondary | ICD-10-CM

## 2014-01-17 NOTE — Progress Notes (Signed)
Patient ID: Vanessa Hubbard, female   DOB: 1946-10-07, 68 y.o.   MRN: [id number]  Subjective:    Vanessa Hubbard is a 68 y.o. female who presents for Medicare Initial preventive examination and to review DEXA results (history of osteopenia)  Preventive Screening-Counseling & Management  Tobacco History  Smoking status  . Never Smoker   Smokeless tobacco  . Never Used     Current Problems (verified) Patient Active Problem List   Diagnosis Date Noted  . Osteopenia 01/17/2014  . Unspecified hypothyroidism 03/03/2013  . Hyperlipidemia with target LDL less than 100 03/03/2013  . Essential hypertension, benign 03/03/2013  . Post herpetic neuralgia 03/03/2013    Medications Prior to Visit Current Outpatient Prescriptions on File Prior to Visit  Medication Sig Dispense Refill  . cetirizine (ZYRTEC) 10 MG tablet Take 1 tablet (10 mg total) by mouth daily as needed. Allergies.  30 tablet  5  . Cholecalciferol (VITAMIN D3) 2000 UNITS TABS Take 1 tablet by mouth.      . fenofibrate micronized (LOFIBRA) 134 MG capsule Take 1 capsule (134 mg total) by mouth daily before breakfast.  90 capsule  1  . hydrochlorothiazide (HYDRODIURIL) 25 MG tablet Take 1 tablet (25 mg total) by mouth daily.  90 tablet  1  . levothyroxine (SYNTHROID, LEVOTHROID) 125 MCG tablet Take 1 tablet (125 mcg total) by mouth daily.  90 tablet  1  . Multiple Vitamin (MULTIVITAMIN) tablet Take 1 tablet by mouth daily.      . rosuvastatin (CRESTOR) 10 MG tablet Take 1 tablet (10 mg total) by mouth daily.  30 tablet  5   No current facility-administered medications on file prior to visit.    Current Medications (verified) Current Outpatient Prescriptions  Medication Sig Dispense Refill  . cetirizine (ZYRTEC) 10 MG tablet Take 1 tablet (10 mg total) by mouth daily as needed. Allergies.  30 tablet  5  . Cholecalciferol (VITAMIN D3) 2000 UNITS TABS Take 1 tablet by mouth.      . fenofibrate micronized (LOFIBRA) 134 MG capsule  Take 1 capsule (134 mg total) by mouth daily before breakfast.  90 capsule  1  . hydrochlorothiazide (HYDRODIURIL) 25 MG tablet Take 1 tablet (25 mg total) by mouth daily.  90 tablet  1  . levothyroxine (SYNTHROID, LEVOTHROID) 125 MCG tablet Take 1 tablet (125 mcg total) by mouth daily.  90 tablet  1  . Multiple Vitamin (MULTIVITAMIN) tablet Take 1 tablet by mouth daily.      . rosuvastatin (CRESTOR) 10 MG tablet Take 1 tablet (10 mg total) by mouth daily.  30 tablet  5   No current facility-administered medications for this visit.     Allergies (verified) Review of patient's allergies indicates no known allergies.   PAST HISTORY  Family History Family History  Problem Relation Age of Onset  . Jaundice Mother   . Cancer Mother     breast  . COPD Father   . Heart disease Father     triple bypass  . Thyroid disease Daughter   . Healthy Son   . Healthy Daughter     Social History History  Substance Use Topics  . Smoking status: Never Smoker   . Smokeless tobacco: Never Used  . Alcohol Use: No     Are there smokers in your home (other than you)? No  Risk Factors Current exercise habits: The patient does not participate in regular exercise at present.  Dietary issues discussed: limiting CHO due to  elevated Tg and overweight   Cardiac risk factors: advanced age (older than 25 for men, 50 for women), dyslipidemia, family history of premature cardiovascular disease, hypertension, obesity (BMI >= 30 kg/m2) and sedentary lifestyle.  Depression Screen (Note: if answer to either of the following is "Yes", a more complete depression screening is indicated)   Over the past 2 weeks, have you felt down, depressed or hopeless? No  Over the past 2 weeks, have you felt little interest or pleasure in doing things? No  Have you lost interest or pleasure in daily life? No  Do you often feel hopeless? No  Do you cry easily over simple problems? No  Activities of Daily Living In your  present state of health, do you have any difficulty performing the following activities?:  Driving? No Managing money?  No Feeding yourself? No Getting from bed to chair? No  Climbing a flight of stairs? No Preparing food and eating?: No Bathing or showering? No Getting dressed: No Getting to the toilet? No Using the toilet:No Moving around from place to place: No In the past year have you fallen or had a near fall?:No   Are you sexually active?  No  Do you have more than one partner?  No  Hearing Difficulties: No Do you often ask people to speak up or repeat themselves? No Do you experience ringing or noises in your ears? No Do you have difficulty understanding soft or whispered voices? No   Do you feel that you have a problem with memory? No  Do you often misplace items? No  Do you feel safe at home?  Yes  Cognitive Testing  Alert? Yes  Normal Appearance?Yes  Oriented to person? Yes  Place? Yes   Time? Yes  Recall of three objects?  Yes  Can perform simple calculations? Yes  Displays appropriate judgment?Yes  Can read the correct time from a watch face?Yes   Advanced Directives have been discussed with the patient? Yes     HPI: Does pt already have a diagnosis of:  Osteopenia?  Yes Osteoporosis?  No  Back Pain?  No       Kyphosis?  No Prior fracture?  Yes - ankle Med(s) for Osteoporosis/Osteopenia:  None currently Med(s) previously tried for Osteoporosis/Osteopenia:  Took fosamax for 14 years (has been off for about 2 years).  Tried Evista by caused upset stomach.                                                             Last Vitamin D Result:  44 (07/2011) Last GFR Result:  77 (12/13/2013)    Calcium Assessment Calcium Intake  # of servings/day  Calcium mg  Milk (8 oz) 0  x  300  = 0  Yogurt (4 oz) 0 x  200 = 0  Cheese (1 oz) 1 x  200 = 200mg   Other Calcium sources   250mg   Ca supplement MVI = 400mg    Estimated calcium intake per day 850mg     DEXA  Results Date of Test T-Score for AP Spine L1-L4 T-Score for Total Left Hip T-Score for Total Right Hip  01/17/2014 -0.2 -0.5 -0.1  08/05/2011 -0.3 -0.6 -0.1  03/05/2010 -0.5 -0.4 -0.1        Lowest T Score was neck  of Left hip and was -2.4 (11/28/1997)   List the Names of Other Physician/Practitioners you currently use: 1.  [facility] - colonoscopy  Indicate any recent Medical Services you may have received from other than [facility] providers in the past year (date may be approximate).  Immunization History  Administered Date(s) Administered  . Influenza,inj,Quad PF,36+ Mos 09/08/2013  . Zoster 01/03/2014    Screening Tests Health Maintenance  Topic Date Due  . Influenza Vaccine  06/09/2014  . Mammogram  12/06/2015  . Colonoscopy  08/09/2016  . Tetanus/tdap  06/09/2021  . Pneumococcal Polysaccharide Vaccine Age 4 And Over  Completed  . Zostavax  Completed    All answers were reviewed with the patient and necessary referrals were made:  Cherre Robins, [facility]   01/17/2014   History reviewed: allergies, current medications, past family history, past medical history, past social history, past surgical history and problem list    Objective:      Body mass index is 32.34 kg/(m^2). BP 128/72  Pulse 80  Ht 5\' 3"  (1.6 m)  Wt 182 lb 8 oz (82.781 kg)  BMI 32.34 kg/m2     Assessment:     Annual Wellness Visit Osteopenia - stable     Plan:     During the course of the visit the patient was educated and counseled about appropriate screening and preventive services including:    Pneumococcal vaccine   Influenza vaccine  Hepatitis B vaccine  Td vaccine  Screening mammography  Screening Pap smear and pelvic exam   Bone densitometry screening  Colorectal cancer screening  Glaucoma screening  Nutrition counseling   Advanced directives: patient given caring connections information about adv directives and discussed   Recommend calcium 1200mg  daily through  supplementation or diet.  Recommend weight bearing exercise - 30 minutes at least 4 days per week.   Counseled and educated about fall risk and prevention. Discussed limiting high fat and CHO containing foods and how they affect Lipids / Tg  Recheck DEXA:  2 years  Patient Instructions (the written plan) was given to the patient.  Medicare Attestation I have personally reviewed: The patient's medical and social history Their use of alcohol, tobacco or illicit drugs Their current medications and supplements The patient's functional ability including ADLs,fall risks, home safety risks, cognitive, and hearing and visual impairment Diet and physical activities Evidence for depression or mood disorders  The patient's weight, height, BMI, and BP/HR have been recorded in the chart.  I have made referrals, counseling, and provided education to the patient based on review of the above and I have provided the patient with a written personalized care plan for preventive services.     Cherre Robins, [facility]   01/17/2014

## 2014-01-17 NOTE — Patient Instructions (Addendum)
Health Maintenance Summary    INFLUENZA VACCINE Next Due 06/09/2014  Last done 09/08/2013    MAMMOGRAM Next Due 12/06/2015  Last done 12/11/2013    COLONOSCOPY Next Due 08/09/2016  Last done 08/09/2006   DEXA / Bone Density Next Due  01/18/2016  Last done 01/17/2014   Pneumonia Vaccine   Last done 06/10/2011   Zostavax completes  Last done 01/03/2014    TETANUS/TDAP Next Due 06/09/2021  Last done 06/10/2011       Preventive Care for Adults, Female A healthy lifestyle and preventive care can promote health and wellness. Preventive health guidelines for women include the following key practices.  A routine yearly physical is a good way to check with your health care provider about your health and preventive screening. It is a chance to share any concerns and updates on your health and to receive a thorough exam.  Visit your dentist for a routine exam and preventive care every 6 months. Brush your teeth twice a day and floss once a day. Good oral hygiene prevents tooth decay and gum disease.  The frequency of eye exams is based on your age, health, family medical history, use of contact lenses, and other factors. Follow your health care provider's recommendations for frequency of eye exams.  Eat a healthy diet. Foods like vegetables, fruits, whole grains, low-fat dairy products, and lean protein foods contain the nutrients you need without too many calories. Decrease your intake of foods high in solid fats, added sugars, and salt. Eat the right amount of calories for you.Get information about a proper diet from your health care provider, if necessary.  Regular physical exercise is one of the most important things you can do for your health. Most adults should get at least 150 minutes of moderate-intensity exercise (any activity that increases your heart rate and causes you to sweat) each week. In addition, most adults need muscle-strengthening exercises on 2 or more days a week.  Maintain a healthy  weight. The body mass index (BMI) is a screening tool to identify possible weight problems. It provides an estimate of body fat based on height and weight. Your health care provider can find your BMI, and can help you achieve or maintain a healthy weight.For adults 20 years and older:  A BMI below 18.5 is considered underweight.  A BMI of 18.5 to 24.9 is normal.  A BMI of 25 to 29.9 is considered overweight.  A BMI of 30 and above is considered obese.  Maintain normal blood lipids and cholesterol levels by exercising and minimizing your intake of saturated fat. Eat a balanced diet with plenty of fruit and vegetables. Blood tests for lipids and cholesterol should begin at age 60 and be repeated every 5 years. If your lipid or cholesterol levels are high, you are over 50, or you are at high risk for heart disease, you may need your cholesterol levels checked more frequently.Ongoing high lipid and cholesterol levels should be treated with medicines if diet and exercise are not working.  If you smoke, find out from your health care provider how to quit. If you do not use tobacco, do not start.  Lung cancer screening is recommended for adults aged 61 80 years who are at high risk for developing lung cancer because of a history of smoking. A yearly low-dose CT scan of the lungs is recommended for people who have at least a 30-pack-year history of smoking and are a current smoker or have quit within the past  15 years. A pack year of smoking is smoking an average of 1 pack of cigarettes a day for 1 year (for example: 1 pack a day for 30 years or 2 packs a day for 15 years). Yearly screening should continue until the smoker has stopped smoking for at least 15 years. Yearly screening should be stopped for people who develop a health problem that would prevent them from having lung cancer treatment.  If you are pregnant, do not drink alcohol. If you are breastfeeding, be very cautious about drinking alcohol.  If you are not pregnant and choose to drink alcohol, do not have more than 1 drink per day. One drink is considered to be 12 ounces (355 mL) of beer, 5 ounces (148 mL) of wine, or 1.5 ounces (44 mL) of liquor.  Avoid use of street drugs. Do not share needles with anyone. Ask for help if you need support or instructions about stopping the use of drugs.  High blood pressure causes heart disease and increases the risk of stroke. Your blood pressure should be checked at least every 1 to 2 years. Ongoing high blood pressure should be treated with medicines if weight loss and exercise do not work.  If you are 62 70 years old, ask your health care provider if you should take aspirin to prevent strokes.  Diabetes screening involves taking a blood sample to check your fasting blood sugar level. This should be done once every 3 years, after age 2, if you are within normal weight and without risk factors for diabetes. Testing should be considered at a younger age or be carried out more frequently if you are overweight and have at least 1 risk factor for diabetes.  Breast cancer screening is essential preventive care for women. You should practice "breast self-awareness." This means understanding the normal appearance and feel of your breasts and may include breast self-examination. Any changes detected, no matter how small, should be reported to a health care provider. Women in their 53s and 30s should have a clinical breast exam (CBE) by a health care provider as part of a regular health exam every 1 to 3 years. After age 20, women should have a CBE every year. Starting at age 64, women should consider having a mammogram (breast X-ray test) every year. Women who have a family history of breast cancer should talk to their health care provider about genetic screening. Women at a high risk of breast cancer should talk to their health care providers about having an MRI and a mammogram every year.  Breast cancer gene  (BRCA)-related cancer risk assessment is recommended for women who have family members with BRCA-related cancers. BRCA-related cancers include breast, ovarian, tubal, and peritoneal cancers. Having family members with these cancers may be associated with an increased risk for harmful changes (mutations) in the breast cancer genes BRCA1 and BRCA2. Results of the assessment will determine the need for genetic counseling and BRCA1 and BRCA2 testing.  The Pap test is a screening test for cervical cancer. A Pap test can show cell changes on the cervix that might become cervical cancer if left untreated. A Pap test is a procedure in which cells are obtained and examined from the lower end of the uterus (cervix).  Women should have a Pap test starting at age 79.  Between ages 29 and 40, Pap tests should be repeated every 2 years.  Beginning at age 26, you should have a Pap test every 3 years as long as  the past 3 Pap tests have been normal.  Some women have medical problems that increase the chance of getting cervical cancer. Talk to your health care provider about these problems. It is especially important to talk to your health care provider if a new problem develops soon after your last Pap test. In these cases, your health care provider may recommend more frequent screening and Pap tests.  The above recommendations are the same for women who have or have not gotten the vaccine for human papillomavirus (HPV).  If you had a hysterectomy for a problem that was not cancer or a condition that could lead to cancer, then you no longer need Pap tests. Even if you no longer need a Pap test, a regular exam is a good idea to make sure no other problems are starting.  If you are between ages 12 and 20 years, and you have had normal Pap tests going back 10 years, you no longer need Pap tests. Even if you no longer need a Pap test, a regular exam is a good idea to make sure no other problems are starting.  If you  have had past treatment for cervical cancer or a condition that could lead to cancer, you need Pap tests and screening for cancer for at least 20 years after your treatment.  If Pap tests have been discontinued, risk factors (such as a new sexual partner) need to be reassessed to determine if screening should be resumed.  The HPV test is an additional test that may be used for cervical cancer screening. The HPV test looks for the virus that can cause the cell changes on the cervix. The cells collected during the Pap test can be tested for HPV. The HPV test could be used to screen women aged 21 years and older, and should be used in women of any age who have unclear Pap test results. After the age of 6, women should have HPV testing at the same frequency as a Pap test.  Colorectal cancer can be detected and often prevented. Most routine colorectal cancer screening begins at the age of 93 years and continues through age 64 years. However, your health care provider may recommend screening at an earlier age if you have risk factors for colon cancer. On a yearly basis, your health care provider may provide home test kits to check for hidden blood in the stool. Use of a small camera at the end of a tube, to directly examine the colon (sigmoidoscopy or colonoscopy), can detect the earliest forms of colorectal cancer. Talk to your health care provider about this at age 45, when routine screening begins. Direct exam of the colon should be repeated every 5 10 years through age 52 years, unless early forms of pre-cancerous polyps or small growths are found.  People who are at an increased risk for hepatitis B should be screened for this virus. You are considered at high risk for hepatitis B if:  You were born in a country where hepatitis B occurs often. Talk with your health care provider about which countries are considered high risk.  Your parents were born in a high-risk country and you have not received a  shot to protect against hepatitis B (hepatitis B vaccine).  You have HIV or AIDS.  You use needles to inject street drugs.  You live with, or have sex with, someone who has Hepatitis B.  You get hemodialysis treatment.  You take certain medicines for conditions like cancer, organ  transplantation, and autoimmune conditions.  Hepatitis C blood testing is recommended for all people born from [birth year] through 1967 and any individual with known risks for hepatitis C.  Practice safe sex. Use condoms and avoid high-risk sexual practices to reduce the spread of sexually transmitted infections (STIs). STIs include gonorrhea, chlamydia, syphilis, trichomonas, herpes, HPV, and human immunodeficiency virus (HIV). Herpes, HIV, and HPV are viral illnesses that have no cure. They can result in disability, cancer, and death. Sexually active women aged 74 years and younger should be checked for chlamydia. Older women with new or multiple partners should also be tested for chlamydia. Testing for other STIs is recommended if you are sexually active and at increased risk.  Osteoporosis is a disease in which the bones lose minerals and strength with aging. This can result in serious bone fractures or breaks. The risk of osteoporosis can be identified using a bone density scan. Women ages 78 years and over and women at risk for fractures or osteoporosis should discuss screening with their health care providers. Ask your health care provider whether you should take a calcium supplement or vitamin D to reduce the rate of osteoporosis.  Menopause can be associated with physical symptoms and risks. Hormone replacement therapy is available to decrease symptoms and risks. You should talk to your health care provider about whether hormone replacement therapy is right for you.  Use sunscreen. Apply sunscreen liberally and repeatedly throughout the day. You should seek shade when your shadow is shorter than you. Protect yourself  by wearing long sleeves, pants, a wide-brimmed hat, and sunglasses year round, whenever you are outdoors.  Once a month, do a whole body skin exam, using a mirror to look at the skin on your back. Tell your health care provider of new moles, moles that have irregular borders, moles that are larger than a pencil eraser, or moles that have changed in shape or color.  Stay current with required vaccines (immunizations).  Influenza vaccine. All adults should be immunized every year.  Tetanus, diphtheria, and acellular pertussis (Td, Tdap) vaccine. Pregnant women should receive 1 dose of Tdap vaccine during each pregnancy. The dose should be obtained regardless of the length of time since the last dose. Immunization is preferred during the 27th 36th week of gestation. An adult who has not previously received Tdap or who does not know her vaccine status should receive 1 dose of Tdap. This initial dose should be followed by tetanus and diphtheria toxoids (Td) booster doses every 10 years. Adults with an unknown or incomplete history of completing a 3-dose immunization series with Td-containing vaccines should begin or complete a primary immunization series including a Tdap dose. Adults should receive a Td booster every 10 years.  Varicella vaccine. An adult without evidence of immunity to varicella should receive 2 doses or a second dose if she has previously received 1 dose. Pregnant females who do not have evidence of immunity should receive the first dose after pregnancy. This first dose should be obtained before leaving the health care facility. The second dose should be obtained 4 8 weeks after the first dose.  Human papillomavirus (HPV) vaccine. Females aged 22 26 years who have not received the vaccine previously should obtain the 3-dose series. The vaccine is not recommended for use in pregnant females. However, pregnancy testing is not needed before receiving a dose. If a female is found to be pregnant  after receiving a dose, no treatment is needed. In that case, the remaining doses  should be delayed until after the pregnancy. Immunization is recommended for any person with an immunocompromised condition through the age of 57 years if she did not get any or all doses earlier. During the 3-dose series, the second dose should be obtained 4 8 weeks after the first dose. The third dose should be obtained 24 weeks after the first dose and 16 weeks after the second dose.  Zoster vaccine. One dose is recommended for adults aged 49 years or older unless certain conditions are present.  Measles, mumps, and rubella (MMR) vaccine. Adults born before [birth year] generally are considered immune to measles and mumps. Adults born in [birth year] or later should have 1 or more doses of MMR vaccine unless there is a contraindication to the vaccine or there is laboratory evidence of immunity to each of the three diseases. A routine second dose of MMR vaccine should be obtained at least 28 days after the first dose for students attending postsecondary schools, health care workers, or international travelers. People who received inactivated measles vaccine or an unknown type of measles vaccine during 1963 1967 should receive 2 doses of MMR vaccine. People who received inactivated mumps vaccine or an unknown type of mumps vaccine before 1979 and are at high risk for mumps infection should consider immunization with 2 doses of MMR vaccine. For females of childbearing age, rubella immunity should be determined. If there is no evidence of immunity, females who are not pregnant should be vaccinated. If there is no evidence of immunity, females who are pregnant should delay immunization until after pregnancy. Unvaccinated health care workers born before [birth year] who lack laboratory evidence of measles, mumps, or rubella immunity or laboratory confirmation of disease should consider measles and mumps immunization with 2 doses of MMR vaccine or rubella  immunization with 1 dose of MMR vaccine.  Pneumococcal 13-valent conjugate (PCV13) vaccine. When indicated, a person who is uncertain of her immunization history and has no record of immunization should receive the PCV13 vaccine. An adult aged 102 years or older who has certain medical conditions and has not been previously immunized should receive 1 dose of PCV13 vaccine. This PCV13 should be followed with a dose of pneumococcal polysaccharide (PPSV23) vaccine. The PPSV23 vaccine dose should be obtained at least 8 weeks after the dose of PCV13 vaccine. An adult aged 70 years or older who has certain medical conditions and previously received 1 or more doses of PPSV23 vaccine should receive 1 dose of PCV13. The PCV13 vaccine dose should be obtained 1 or more years after the last PPSV23 vaccine dose.  Pneumococcal polysaccharide (PPSV23) vaccine. When PCV13 is also indicated, PCV13 should be obtained first. All adults aged 23 years and older should be immunized. An adult younger than age 15 years who has certain medical conditions should be immunized. Any person who resides in a nursing home or long-term care facility should be immunized. An adult smoker should be immunized. People with an immunocompromised condition and certain other conditions should receive both PCV13 and PPSV23 vaccines. People with human immunodeficiency virus (HIV) infection should be immunized as soon as possible after diagnosis. Immunization during chemotherapy or radiation therapy should be avoided. Routine use of PPSV23 vaccine is not recommended for American Indians, [place] Natives, or people younger than 65 years unless there are medical conditions that require PPSV23 vaccine. When indicated, people who have unknown immunization and have no record of immunization should receive PPSV23 vaccine. One-time revaccination 5 years after the first dose of PPSV23 is recommended  for people aged 35 64 years who have chronic kidney failure,  nephrotic syndrome, asplenia, or immunocompromised conditions. People who received 1 2 doses of PPSV23 before age 35 years should receive another dose of PPSV23 vaccine at age 61 years or later if at least 5 years have passed since the previous dose. Doses of PPSV23 are not needed for people immunized with PPSV23 at or after age 36 years.  Meningococcal vaccine. Adults with asplenia or persistent complement component deficiencies should receive 2 doses of quadrivalent meningococcal conjugate (MenACWY-D) vaccine. The doses should be obtained at least 2 months apart. Microbiologists working with certain meningococcal bacteria, [employer] recruits, people at risk during an outbreak, and people who travel to or live in countries with a high rate of meningitis should be immunized. A first-year college student up through age 54 years who is living in a residence hall should receive a dose if she did not receive a dose on or after her 16th birthday. Adults who have certain high-risk conditions should receive one or more doses of vaccine.  Hepatitis A vaccine. Adults who wish to be protected from this disease, have certain high-risk conditions, work with hepatitis A-infected animals, work in hepatitis A research labs, or travel to or work in countries with a high rate of hepatitis A should be immunized. Adults who were previously unvaccinated and who anticipate close contact with an international adoptee during the first 60 days after arrival in the [place] from a country with a high rate of hepatitis A should be immunized.  Hepatitis B vaccine. Adults who wish to be protected from this disease, have certain high-risk conditions, may be exposed to blood or other infectious body fluids, are household contacts or sex partners of hepatitis B positive people, are clients or workers in certain care facilities, or travel to or work in countries with a high rate of hepatitis B should be immunized.   Fall Prevention  and Home Safety Falls cause injuries and can affect all age groups. It is possible to use preventive measures to significantly decrease the likelihood of falls. There are many simple measures which can make your home safer and prevent falls. OUTDOORS  Repair cracks and edges of walkways and driveways.  Remove high doorway thresholds.  Trim shrubbery on the main path into your home.  Have good outside lighting.  Clear walkways of tools, rocks, debris, and clutter.  Check that handrails are not broken and are securely fastened. Both sides of steps should have handrails.  Have leaves, snow, and ice cleared regularly.  Use sand or salt on walkways during winter months.  In the garage, clean up grease or oil spills. BATHROOM  Install night lights.  Install grab bars by the toilet and in the tub and shower.  Use non-skid mats or decals in the tub or shower.  Place a plastic non-slip stool in the shower to sit on, if needed.  Keep floors dry and clean up all water on the floor immediately.  Remove soap buildup in the tub or shower on a regular basis.  Secure bath mats with non-slip, double-sided rug tape.  Remove throw rugs and tripping hazards from the floors. BEDROOMS  Install night lights.  Make sure a bedside light is easy to reach.  Do not use oversized bedding.  Keep a telephone by your bedside.  Have a firm chair with side arms to use for getting dressed.  Remove throw rugs and tripping hazards from the floor. KITCHEN  Keep handles  on pots and pans turned toward the center of the stove. Use back burners when possible.  Clean up spills quickly and allow time for drying.  Avoid walking on wet floors.  Avoid hot utensils and knives.  Position shelves so they are not too high or low.  Place commonly used objects within easy reach.  If necessary, use a sturdy step stool with a grab bar when reaching.  Keep electrical cables out of the way.  Do not use  floor polish or wax that makes floors slippery. If you must use wax, use non-skid floor wax.  Remove throw rugs and tripping hazards from the floor. STAIRWAYS  Never leave objects on stairs.  Place handrails on both sides of stairways and use them. Fix any loose handrails. Make sure handrails on both sides of the stairways are as long as the stairs.  Check carpeting to make sure it is firmly attached along stairs. Make repairs to worn or loose carpet promptly.  Avoid placing throw rugs at the top or bottom of stairways, or properly secure the rug with carpet tape to prevent slippage. Get rid of throw rugs, if possible.  Have an electrician put in a light switch at the top and bottom of the stairs. OTHER FALL PREVENTION TIPS  Wear low-heel or rubber-soled shoes that are supportive and fit well. Wear closed toe shoes.  When using a stepladder, make sure it is fully opened and both spreaders are firmly locked. Do not climb a closed stepladder.  Add color or contrast paint or tape to grab bars and handrails in your home. Place contrasting color strips on first and last steps.  Learn and use mobility aids as needed. Install an electrical emergency response system.  Turn on lights to avoid dark areas. Replace light bulbs that burn out immediately. Get light switches that glow.  Arrange furniture to create clear pathways. Keep furniture in the same place.  Firmly attach carpet with non-skid or double-sided tape.  Eliminate uneven floor surfaces.  Select a carpet pattern that does not visually hide the edge of steps.  Be aware of all pets. OTHER HOME SAFETY TIPS  Set the water temperature for 120 F (48.8 C).  Keep emergency numbers on or near the telephone.  Keep smoke detectors on every level of the home and near sleeping areas. Document Released: 10/16/2002 Document Revised: 04/26/2012 Document Reviewed: 01/15/2012 [facility] Patient Information 2014 [place].

## 2014-03-12 ENCOUNTER — Telehealth: Payer: Self-pay | Admitting: Nurse Practitioner

## 2014-03-12 DIAGNOSIS — E785 Hyperlipidemia, unspecified: Secondary | ICD-10-CM

## 2014-03-12 MED ORDER — ROSUVASTATIN CALCIUM 10 MG PO TABS
10.0000 mg | ORAL_TABLET | Freq: Every day | ORAL | Status: DC
Start: 2014-03-12 — End: 2014-03-30

## 2014-03-12 NOTE — Telephone Encounter (Signed)
rx sent to pharmacy

## 2014-03-14 ENCOUNTER — Ambulatory Visit: Payer: Medicare Other | Admitting: Nurse Practitioner

## 2014-03-30 ENCOUNTER — Other Ambulatory Visit: Payer: Self-pay | Admitting: Nurse Practitioner

## 2014-03-30 ENCOUNTER — Encounter: Payer: Self-pay | Admitting: Nurse Practitioner

## 2014-03-30 ENCOUNTER — Ambulatory Visit (INDEPENDENT_AMBULATORY_CARE_PROVIDER_SITE_OTHER): Payer: Medicare Other | Admitting: Nurse Practitioner

## 2014-03-30 VITALS — BP 136/82 | HR 69 | Temp 98.0°F | Ht 63.0 in | Wt 180.0 lb

## 2014-03-30 DIAGNOSIS — H6190 Disorder of external ear, unspecified, unspecified ear: Secondary | ICD-10-CM

## 2014-03-30 DIAGNOSIS — E039 Hypothyroidism, unspecified: Secondary | ICD-10-CM | POA: Diagnosis not present

## 2014-03-30 DIAGNOSIS — E559 Vitamin D deficiency, unspecified: Secondary | ICD-10-CM | POA: Diagnosis not present

## 2014-03-30 DIAGNOSIS — E785 Hyperlipidemia, unspecified: Secondary | ICD-10-CM

## 2014-03-30 DIAGNOSIS — H6192 Disorder of left external ear, unspecified: Secondary | ICD-10-CM

## 2014-03-30 DIAGNOSIS — I10 Essential (primary) hypertension: Secondary | ICD-10-CM | POA: Diagnosis not present

## 2014-03-30 MED ORDER — LEVOTHYROXINE SODIUM 125 MCG PO TABS: 125.0000 ug | ORAL_TABLET | Freq: Every day | ORAL | Status: DC

## 2014-03-30 MED ORDER — ROSUVASTATIN CALCIUM 10 MG PO TABS: 10.0000 mg | ORAL_TABLET | Freq: Every day | ORAL | Status: DC

## 2014-03-30 MED ORDER — FENOFIBRATE MICRONIZED 134 MG PO CAPS: 134.0000 mg | ORAL_CAPSULE | Freq: Every day | ORAL | Status: DC

## 2014-03-30 NOTE — Progress Notes (Signed)
Subjective:    Patient ID: Vanessa Hubbard, female    DOB: 07/10/1946, 68 y.o.   MRN: [id number]  Patient here today for follow up of chronic medical problems- no changes since last visit.  Hyperlipidemia This is a chronic problem. The current episode started more than 1 year ago. The problem is controlled. Recent lipid tests were reviewed and are low. She has no history of diabetes. There are no known factors aggravating her hyperlipidemia. Pertinent negatives include no leg pain, myalgias or shortness of breath. Current antihyperlipidemic treatment includes statins and fibric acid derivatives. The current treatment provides significant improvement of lipids. Risk factors for coronary artery disease include post-menopausal, hypertension, dyslipidemia and family history.  Hypertension This is a chronic problem. The current episode started more than 1 year ago. The problem has been resolved since onset. The problem is controlled. Pertinent negatives include no anxiety, blurred vision, palpitations, peripheral edema or shortness of breath. Risk factors for coronary artery disease include dyslipidemia, family history, obesity and post-menopausal state. Past treatments include diuretics. The current treatment provides significant improvement. Hypertensive end-organ damage includes a thyroid problem. There is no history of sleep apnea.  Thyroid Problem Presents for follow-up visit. Symptoms include dry skin. Patient reports no anxiety, constipation, depressed mood, hair loss or palpitations. The symptoms have been stable. Her past medical history is significant for hyperlipidemia. There is no history of diabetes or obesity.      Review of Systems  Eyes: Negative for blurred vision.  Respiratory: Negative for shortness of breath.   Cardiovascular: Negative for palpitations.  Gastrointestinal: Negative for constipation.  Musculoskeletal: Negative for myalgias.  All other systems reviewed and are  negative.      Objective:   Physical Exam  Vitals reviewed. Constitutional: She is oriented to person, place, and time. She appears well-developed and well-nourished.  HENT:  Head: Normocephalic.  Right Ear: External ear normal.  Left Ear: External ear normal.  Mouth/Throat: Oropharynx is clear and moist.  Eyes: Pupils are equal, round, and reactive to light.  Neck: Normal range of motion. Neck supple. No thyromegaly present.  Cardiovascular: Normal rate, regular rhythm, normal heart sounds and intact distal pulses.   Pulmonary/Chest: Effort normal and breath sounds normal.  Abdominal: Soft. Bowel sounds are normal. She exhibits no distension. There is no tenderness.  Musculoskeletal: Normal range of motion. She exhibits no edema and no tenderness.  Neurological: She is alert and oriented to person, place, and time.  Skin: Skin is warm and dry.  Dry cauliflour looking flesh colored lesion left auricle  Psychiatric: She has a normal mood and affect. Her behavior is normal. Judgment and thought content normal.    BP 154/68  Pulse 69  Temp(Src) 98 F (36.7 C) (Oral)  Ht 5' 3" (1.6 m)  Wt 180 lb (81.647 kg)  BMI 31.89 kg/m2       Assessment & Plan:     1. Hyperlipidemia with target LDL less than 100   2. Essential hypertension, benign   3. Hypothyroidism   4. Vitamin D deficiency   5. Unspecified hypothyroidism   6. Lesion of left external ear    Orders Placed This Encounter  Procedures  . CMP14+EGFR  . NMR, lipoprofile  . Vit D  25 hydroxy (rtn osteoporosis monitoring)  . Thyroid Panel With TSH  . Ambulatory referral to Dermatology    Referral Priority:  Routine    Referral Type:  Consultation    Referral Reason:  Specialty Services Required  Requested Specialty:  Dermatology    Number of Visits Requested:  1   Meds ordered this encounter  Medications  . aspirin 81 MG tablet    Sig: Take 81 mg by mouth daily.  . fenofibrate micronized (LOFIBRA) 134 MG  capsule    Sig: Take 1 capsule (134 mg total) by mouth daily before breakfast.    Dispense:  90 capsule    Refill:  1    Order Specific Question:  Supervising Provider    Answer:  Chipper Herb [[id number]]  . rosuvastatin (CRESTOR) 10 MG tablet    Sig: Take 1 tablet (10 mg total) by mouth daily.    Dispense:  90 tablet    Refill:  1    Order Specific Question:  Supervising Provider    Answer:  Chipper Herb [[id number]]  . levothyroxine (SYNTHROID, LEVOTHROID) 125 MCG tablet    Sig: Take 1 tablet (125 mcg total) by mouth daily.    Dispense:  90 tablet    Refill:  1    Order Specific Question:  Supervising Provider    Answer:  Chipper Herb [place] pending Health maintenance reviewed Diet and exercise encouraged Continue all meds Follow up  In 76month   Mary-Margaret MHassell Done FNP

## 2014-03-30 NOTE — Patient Instructions (Signed)

## 2014-04-03 ENCOUNTER — Other Ambulatory Visit: Payer: Self-pay | Admitting: Nurse Practitioner

## 2014-04-03 DIAGNOSIS — E039 Hypothyroidism, unspecified: Secondary | ICD-10-CM

## 2014-04-03 LAB — CMP14+EGFR
A/G RATIO: 2.5 (ref 1.1–2.5)
ALBUMIN: 4.5 g/dL (ref 3.5–4.8)
ALK PHOS: 59 IU/L (ref 39–117)
ALT: 20 IU/L (ref 0–32)
AST: 27 IU/L (ref 0–40)
BILIRUBIN TOTAL: 0.7 mg/dL (ref 0.0–1.2)
BUN / CREAT RATIO: 18 (ref 11–26)
BUN: 14 mg/dL (ref 8–27)
CO2: 24 mmol/L (ref 18–29)
Calcium: 9.9 mg/dL (ref 8.7–10.3)
Chloride: 99 mmol/L (ref 97–108)
Creatinine, Ser: 0.78 mg/dL (ref 0.57–1.00)
GFR calc Af Amer: 89 mL/min/{1.73_m2} (ref >59)
GFR calc non Af Amer: 77 mL/min/{1.73_m2} (ref >59)
Globulin, Total: 1.8 g/dL (ref 1.5–4.5)
Glucose: 87 mg/dL (ref 65–99)
Potassium: 4.1 mmol/L (ref 3.5–5.2)
SODIUM: 141 mmol/L (ref 134–144)
Total Protein: 6.3 g/dL (ref 6.0–8.5)

## 2014-04-03 LAB — THYROID PANEL WITH TSH
FREE THYROXINE INDEX: 3.3 (ref 1.2–4.9)
T3 UPTAKE RATIO: 27 % (ref 24–39)
T4 TOTAL: 12.4 ug/dL — AB (ref 4.5–12.0)
TSH: 0.122 u[IU]/mL — AB (ref 0.450–4.500)

## 2014-04-03 LAB — LIPID PANEL
Chol/HDL Ratio: 1.7 ratio units (ref 0.0–4.4)
Cholesterol, Total: 124 mg/dL (ref 100–199)
HDL: 73 mg/dL (ref >39)
LDL CALC: 35 mg/dL (ref 0–99)
Triglycerides: 82 mg/dL (ref 0–149)
VLDL Cholesterol Cal: 16 mg/dL (ref 5–40)

## 2014-04-03 LAB — NMR, LIPOPROFILE

## 2014-04-03 LAB — SPECIMEN STATUS REPORT

## 2014-04-03 LAB — VITAMIN D 25 HYDROXY (VIT D DEFICIENCY, FRACTURES): Vit D, 25-Hydroxy: 31.8 ng/mL (ref 30.0–100.0)

## 2014-04-03 MED ORDER — LEVOTHYROXINE SODIUM 100 MCG PO TABS: 100.0000 ug | ORAL_TABLET | Freq: Every day | ORAL | Status: DC

## 2014-04-04 LAB — NMR, LIPOPROFILE
CHOLESTEROL: 124 mg/dL (ref 100–199)
HDL Cholesterol by NMR: 65 mg/dL (ref >39)
HDL Particle Number: 53.5 umol/L (ref >=30.5)
LDL PARTICLE NUMBER: 631 nmol/L (ref <1000)
LDL SIZE: 19.7 nm (ref >20.5)
LDLC SERPL CALC-MCNC: 44 mg/dL (ref 0–99)
LP-IR SCORE: 56 — AB (ref <=45)
Small LDL Particle Number: 427 nmol/L (ref <=527)
Triglycerides by NMR: 73 mg/dL (ref 0–149)

## 2014-04-13 ENCOUNTER — Telehealth: Payer: Self-pay | Admitting: Nurse Practitioner

## 2014-04-13 NOTE — Telephone Encounter (Signed)
Patient aware.

## 2014-04-19 DIAGNOSIS — D235 Other benign neoplasm of skin of trunk: Secondary | ICD-10-CM | POA: Diagnosis not present

## 2014-04-19 DIAGNOSIS — B079 Viral wart, unspecified: Secondary | ICD-10-CM | POA: Diagnosis not present

## 2014-04-19 DIAGNOSIS — Z85820 Personal history of malignant melanoma of skin: Secondary | ICD-10-CM | POA: Diagnosis not present

## 2014-06-26 ENCOUNTER — Other Ambulatory Visit: Payer: Self-pay | Admitting: Nurse Practitioner

## 2014-07-05 ENCOUNTER — Ambulatory Visit (INDEPENDENT_AMBULATORY_CARE_PROVIDER_SITE_OTHER): Payer: Medicare Other | Admitting: Nurse Practitioner

## 2014-07-05 ENCOUNTER — Encounter: Payer: Self-pay | Admitting: Nurse Practitioner

## 2014-07-05 VITALS — BP 139/75 | HR 70 | Temp 96.7°F | Ht 63.0 in | Wt 179.0 lb

## 2014-07-05 DIAGNOSIS — E785 Hyperlipidemia, unspecified: Secondary | ICD-10-CM

## 2014-07-05 DIAGNOSIS — E038 Other specified hypothyroidism: Secondary | ICD-10-CM

## 2014-07-05 DIAGNOSIS — E0789 Other specified disorders of thyroid: Secondary | ICD-10-CM

## 2014-07-05 DIAGNOSIS — E034 Atrophy of thyroid (acquired): Secondary | ICD-10-CM

## 2014-07-05 DIAGNOSIS — J301 Allergic rhinitis due to pollen: Secondary | ICD-10-CM | POA: Diagnosis not present

## 2014-07-05 DIAGNOSIS — I152 Hypertension secondary to endocrine disorders: Secondary | ICD-10-CM | POA: Insufficient documentation

## 2014-07-05 DIAGNOSIS — I10 Essential (primary) hypertension: Secondary | ICD-10-CM | POA: Diagnosis not present

## 2014-07-05 DIAGNOSIS — Z6831 Body mass index (BMI) 31.0-31.9, adult: Secondary | ICD-10-CM

## 2014-07-05 DIAGNOSIS — E1159 Type 2 diabetes mellitus with other circulatory complications: Secondary | ICD-10-CM | POA: Insufficient documentation

## 2014-07-05 DIAGNOSIS — Z713 Dietary counseling and surveillance: Secondary | ICD-10-CM

## 2014-07-05 MED ORDER — ROSUVASTATIN CALCIUM 10 MG PO TABS: 10.0000 mg | ORAL_TABLET | Freq: Every day | ORAL | Status: DC

## 2014-07-05 MED ORDER — FENOFIBRATE MICRONIZED 134 MG PO CAPS: 134.0000 mg | ORAL_CAPSULE | Freq: Every day | ORAL | Status: DC

## 2014-07-05 MED ORDER — LEVOTHYROXINE SODIUM 100 MCG PO TABS: 100.0000 ug | ORAL_TABLET | Freq: Every day | ORAL | Status: DC

## 2014-07-05 MED ORDER — HYDROCHLOROTHIAZIDE 25 MG PO TABS: ORAL_TABLET | ORAL | Status: DC

## 2014-07-05 MED ORDER — CETIRIZINE HCL 10 MG PO TABS: 10.0000 mg | ORAL_TABLET | Freq: Every day | ORAL | Status: DC | PRN

## 2014-07-05 NOTE — Patient Instructions (Signed)

## 2014-07-05 NOTE — Progress Notes (Signed)
Subjective:    Patient ID: Vanessa Hubbard, female    DOB: 1946-10-02, 68 y.o.   MRN: [id number]  HPI  Follow up for chronic medical problems.  No complaints at this time.  Hyperlipidemia This is a chronic problem. The current episode started more than 1 year ago. The problem is controlled. Recent lipid tests were reviewed and are low. She has no history of diabetes. There are no known factors aggravating her hyperlipidemia. Pertinent negatives include no leg pain, myalgias or shortness of breath. Current antihyperlipidemic treatment includes statins and fibric acid derivatives. The current treatment provides significant improvement of lipids. Risk factors for coronary artery disease include post-menopausal, hypertension, dyslipidemia and family history.  Hypertension This is a chronic problem. The current episode started more than 1 year ago. The problem has been resolved since onset. The problem is controlled. Pertinent negatives include no anxiety, blurred vision, palpitations, peripheral edema or shortness of breath. Risk factors for coronary artery disease include dyslipidemia, family history, obesity and post-menopausal state. Past treatments include diuretics. The current treatment provides significant improvement. Hypertensive end-organ damage includes a thyroid problem. There is no history of sleep apnea.  Thyroid Problem Presents for follow-up visit. Symptoms include dry skin. Patient reports no anxiety, constipation, depressed mood, hair loss or palpitations. The symptoms have been stable. Her past medical history is significant for hyperlipidemia. There is no history of diabetes or obesity.     Review of Systems  Constitutional: Negative.   Respiratory: Negative.   Cardiovascular: Negative.   Genitourinary: Negative.   Skin: Negative.   Neurological: Negative.   All other systems reviewed and are negative.      Objective:   Physical Exam  Constitutional: She is oriented to  person, place, and time. She appears well-developed and well-nourished.  HENT:  Nose: Nose normal.  Mouth/Throat: Oropharynx is clear and moist.  Eyes: EOM are normal.  Neck: Trachea normal, normal range of motion and full passive range of motion without pain. Neck supple. No JVD present. Carotid bruit is not present. No thyromegaly present.  Cardiovascular: Normal rate, regular rhythm, normal heart sounds and intact distal pulses.  Exam reveals no gallop and no friction rub.   No murmur heard. Pulmonary/Chest: Effort normal and breath sounds normal.  Abdominal: Soft. Bowel sounds are normal. She exhibits no distension and no mass. There is no tenderness.  Musculoskeletal: Normal range of motion.  Lymphadenopathy:    She has no cervical adenopathy.  Neurological: She is alert and oriented to person, place, and time. She has normal reflexes.  Skin: Skin is warm and dry.  Psychiatric: She has a normal mood and affect. Her behavior is normal. Judgment and thought content normal.   BP 139/75  Pulse 70  Temp(Src) 96.7 F (35.9 C) (Oral)  Ht $R'5\' 3"'Sx$  (1.6 m)  Wt 179 lb (81.194 kg)  BMI 31.72 kg/m2        Assessment & Plan:   1. Hyperlipidemia with target LDL less than 100   2. Allergic rhinitis due to pollen   3. Hypothyroidism due to acquired atrophy of thyroid   4. Essential hypertension   5. BMI 31.0-31.9,adult   6. Weight loss counseling, encounter for    Orders Placed This Encounter  Procedures  . CMP14+EGFR  . CBC With differential/Platelet  . Thyroid Panel With TSH   Meds ordered this encounter  Medications  . cetirizine (ZYRTEC) 10 MG tablet    Sig: Take 1 tablet (10 mg total) by mouth daily  as needed. Allergies.    Dispense:  90 tablet    Refill:  1    Order Specific Question:  Supervising Provider    Answer:  Chipper Herb [[id number]]  . rosuvastatin (CRESTOR) 10 MG tablet    Sig: Take 1 tablet (10 mg total) by mouth daily.    Dispense:  30 tablet    Refill:  5     Order Specific Question:  Supervising Provider    Answer:  Chipper Herb [[id number]]  . fenofibrate micronized (LOFIBRA) 134 MG capsule    Sig: Take 1 capsule (134 mg total) by mouth daily before breakfast.    Dispense:  90 capsule    Refill:  1    Order Specific Question:  Supervising Provider    Answer:  Chipper Herb [[id number]]  . hydrochlorothiazide (HYDRODIURIL) 25 MG tablet    Sig: TAKE ONE TABLET BY MOUTH ONCE DAILY    Dispense:  90 tablet    Refill:  0    Order Specific Question:  Supervising Provider    Answer:  Chipper Herb [[id number]]  . levothyroxine (SYNTHROID, LEVOTHROID) 100 MCG tablet    Sig: Take 1 tablet (100 mcg total) by mouth daily.    Dispense:  90 tablet    Refill:  1    Order Specific Question:  Supervising Provider    Answer:  Chipper Herb [[id number]]    Labs pending Health maintenance reviewed Diet and exercise encouraged Continue all meds Follow up  In 3 months   [place], FNP

## 2014-07-06 LAB — CMP14+EGFR
ALK PHOS: 57 IU/L (ref 39–117)
ALT: 22 IU/L (ref 0–32)
AST: 26 IU/L (ref 0–40)
Albumin/Globulin Ratio: 2.8 — ABNORMAL HIGH (ref 1.1–2.5)
Albumin: 4.7 g/dL (ref 3.5–4.8)
BUN/Creatinine Ratio: 16 (ref 11–26)
BUN: 13 mg/dL (ref 8–27)
CO2: 25 mmol/L (ref 18–29)
CREATININE: 0.82 mg/dL (ref 0.57–1.00)
Calcium: 9.8 mg/dL (ref 8.7–10.3)
Chloride: 100 mmol/L (ref 97–108)
GFR calc Af Amer: 84 mL/min/{1.73_m2} (ref >59)
GFR calc non Af Amer: 73 mL/min/{1.73_m2} (ref >59)
GLOBULIN, TOTAL: 1.7 g/dL (ref 1.5–4.5)
Glucose: 113 mg/dL — ABNORMAL HIGH (ref 65–99)
Potassium: 4 mmol/L (ref 3.5–5.2)
SODIUM: 142 mmol/L (ref 134–144)
Total Bilirubin: 0.8 mg/dL (ref 0.0–1.2)
Total Protein: 6.4 g/dL (ref 6.0–8.5)

## 2014-07-06 LAB — CBC WITH DIFFERENTIAL
BASOS: 1 %
Basophils Absolute: 0.1 10*3/uL (ref 0.0–0.2)
EOS: 3 %
Eosinophils Absolute: 0.2 10*3/uL (ref 0.0–0.4)
HCT: 41.7 % (ref 34.0–46.6)
Hemoglobin: 14.2 g/dL (ref 11.1–15.9)
IMMATURE GRANS (ABS): 0 10*3/uL (ref 0.0–0.1)
IMMATURE GRANULOCYTES: 0 %
LYMPHS: 32 %
Lymphocytes Absolute: 1.9 10*3/uL (ref 0.7–3.1)
MCH: 30.3 pg (ref 26.6–33.0)
MCHC: 34.1 g/dL (ref 31.5–35.7)
MCV: 89 fL (ref 79–97)
MONOCYTES: 9 %
Monocytes Absolute: 0.5 10*3/uL (ref 0.1–0.9)
NEUTROS PCT: 55 %
Neutrophils Absolute: 3.2 10*3/uL (ref 1.4–7.0)
PLATELETS: 291 10*3/uL (ref 150–379)
RBC: 4.69 x10E6/uL (ref 3.77–5.28)
RDW: 13.7 % (ref 12.3–15.4)
WBC: 5.8 10*3/uL (ref 3.4–10.8)

## 2014-07-06 LAB — THYROID PANEL WITH TSH
FREE THYROXINE INDEX: 3.1 (ref 1.2–4.9)
T3 UPTAKE RATIO: 27 % (ref 24–39)
T4 TOTAL: 11.3 ug/dL (ref 4.5–12.0)
TSH: 0.774 u[IU]/mL (ref 0.450–4.500)

## 2014-07-13 ENCOUNTER — Encounter: Payer: Self-pay | Admitting: *Deleted

## 2014-08-24 ENCOUNTER — Other Ambulatory Visit: Payer: Self-pay

## 2014-08-30 ENCOUNTER — Ambulatory Visit (INDEPENDENT_AMBULATORY_CARE_PROVIDER_SITE_OTHER): Payer: Medicare Other

## 2014-08-30 DIAGNOSIS — Z23 Encounter for immunization: Secondary | ICD-10-CM

## 2014-10-18 ENCOUNTER — Encounter: Payer: Self-pay | Admitting: Nurse Practitioner

## 2014-10-18 ENCOUNTER — Ambulatory Visit (INDEPENDENT_AMBULATORY_CARE_PROVIDER_SITE_OTHER): Payer: Medicare Other | Admitting: Nurse Practitioner

## 2014-10-18 VITALS — BP 139/72 | HR 74 | Temp 97.8°F | Ht 63.0 in | Wt 184.0 lb

## 2014-10-18 DIAGNOSIS — I10 Essential (primary) hypertension: Secondary | ICD-10-CM

## 2014-10-18 DIAGNOSIS — E785 Hyperlipidemia, unspecified: Secondary | ICD-10-CM | POA: Diagnosis not present

## 2014-10-18 DIAGNOSIS — M8580 Other specified disorders of bone density and structure, unspecified site: Secondary | ICD-10-CM | POA: Diagnosis not present

## 2014-10-18 DIAGNOSIS — E039 Hypothyroidism, unspecified: Secondary | ICD-10-CM | POA: Diagnosis not present

## 2014-10-18 MED ORDER — HYDROCHLOROTHIAZIDE 25 MG PO TABS: ORAL_TABLET | ORAL | Status: DC

## 2014-10-18 NOTE — Patient Instructions (Signed)

## 2014-10-18 NOTE — Progress Notes (Signed)
  Subjective:    Patient ID: Vanessa Hubbard, female    DOB: 10/05/1946, 68 y.o.   MRN: [id number]  Patient here today for follow up of chronic medical problems- no changes since last visit.  Hyperlipidemia This is a chronic problem. The current episode started more than 1 year ago. The problem is controlled. Recent lipid tests were reviewed and are variable. Pertinent negatives include no myalgias or shortness of breath. Current antihyperlipidemic treatment includes statins, ezetimibe and fibric acid derivatives. The current treatment provides moderate improvement of lipids. Compliance problems include adherence to diet and adherence to exercise.  Risk factors for coronary artery disease include diabetes mellitus, dyslipidemia, hypertension and obesity.  Hypertension This is a chronic problem. The problem is controlled. Pertinent negatives include no palpitations or shortness of breath. Agents associated with hypertension include steroids. Risk factors for coronary artery disease include diabetes mellitus, dyslipidemia and stress. Past treatments include diuretics. The current treatment provides moderate improvement. Compliance problems include diet and exercise.  Hypertensive end-organ damage includes a thyroid problem.  Thyroid Problem Visit type: hypothyroidism. Patient reports no constipation or palpitations. The symptoms have been stable. The treatment provided mild relief. Her past medical history is significant for hyperlipidemia.  osteopenia Only thing she is taking is vitamin d   Review of Systems  Respiratory: Negative for shortness of breath.   Cardiovascular: Negative for palpitations.  Gastrointestinal: Negative for constipation.  Musculoskeletal: Negative for myalgias.  All other systems reviewed and are negative.      Objective:   Physical Exam  Constitutional: She is oriented to person, place, and time. She appears well-developed and well-nourished.  HENT:  Nose: Nose  normal.  Mouth/Throat: Oropharynx is clear and moist.  Eyes: EOM are normal.  Neck: Trachea normal, normal range of motion and full passive range of motion without pain. Neck supple. No JVD present. Carotid bruit is not present. No thyromegaly present.  Cardiovascular: Normal rate, regular rhythm, normal heart sounds and intact distal pulses.  Exam reveals no gallop and no friction rub.   No murmur heard. Pulmonary/Chest: Effort normal and breath sounds normal.  Abdominal: Soft. Bowel sounds are normal. She exhibits no distension and no mass. There is no tenderness.  Musculoskeletal: Normal range of motion.  Lymphadenopathy:    She has no cervical adenopathy.  Neurological: She is alert and oriented to person, place, and time. She has normal reflexes.  Skin: Skin is warm and dry.  Psychiatric: She has a normal mood and affect. Her behavior is normal. Judgment and thought content normal.    BP 139/72 mmHg  Pulse 74  Temp(Src) 97.8 F (36.6 C) (Oral)  Ht _0  (1.6 m)  Wt 184 lb (83.462 kg)  BMI 32.60 kg/m2       Assessment & Plan:   1. Essential hypertension Do  Not add salt to diet - CMP14+EGFR - hydrochlorothiazide (HYDRODIURIL) 25 MG tablet; TAKE ONE TABLET BY MOUTH ONCE DAILY  Dispense: 90 tablet; Refill: 1  2. Hypothyroidism, unspecified hypothyroidism type - Thyroid Panel With TSH  3. Osteopenia Weight bearing exercises  4. Hyperlipidemia with target LDL less than 100 Low fat diet - NMR, lipoprofile    Labs pending Health maintenance reviewed Diet and exercise encouraged Continue all meds Follow up  In 3 months   [place], FNP

## 2014-10-19 LAB — NMR, LIPOPROFILE
Cholesterol: 129 mg/dL (ref 100–199)
HDL Cholesterol by NMR: 73 mg/dL (ref >39)
HDL PARTICLE NUMBER: 54 umol/L (ref >=30.5)
LDL PARTICLE NUMBER: 565 nmol/L (ref <1000)
LDL Size: 20 nm (ref >20.5)
LDL-C: 38 mg/dL (ref 0–99)
LP-IR SCORE: 49 — AB (ref <=45)
Small LDL Particle Number: 421 nmol/L (ref <=527)
Triglycerides by NMR: 89 mg/dL (ref 0–149)

## 2014-10-19 LAB — CMP14+EGFR
ALK PHOS: 63 IU/L (ref 39–117)
ALT: 18 IU/L (ref 0–32)
AST: 22 IU/L (ref 0–40)
Albumin/Globulin Ratio: 2.3 (ref 1.1–2.5)
Albumin: 4.6 g/dL (ref 3.5–4.8)
BUN / CREAT RATIO: 16 (ref 11–26)
BUN: 13 mg/dL (ref 8–27)
CHLORIDE: 101 mmol/L (ref 97–108)
CO2: 26 mmol/L (ref 18–29)
Calcium: 10 mg/dL (ref 8.7–10.3)
Creatinine, Ser: 0.82 mg/dL (ref 0.57–1.00)
GFR calc Af Amer: 84 mL/min/{1.73_m2} (ref >59)
GFR calc non Af Amer: 73 mL/min/{1.73_m2} (ref >59)
GLOBULIN, TOTAL: 2 g/dL (ref 1.5–4.5)
Glucose: 96 mg/dL (ref 65–99)
POTASSIUM: 4.3 mmol/L (ref 3.5–5.2)
Sodium: 142 mmol/L (ref 134–144)
Total Bilirubin: 0.6 mg/dL (ref 0.0–1.2)
Total Protein: 6.6 g/dL (ref 6.0–8.5)

## 2014-10-19 LAB — THYROID PANEL WITH TSH
Free Thyroxine Index: 3 (ref 1.2–4.9)
T3 Uptake Ratio: 25 % (ref 24–39)
T4, Total: 11.8 ug/dL (ref 4.5–12.0)
TSH: 3.3 u[IU]/mL (ref 0.450–4.500)

## 2014-11-03 DIAGNOSIS — R062 Wheezing: Secondary | ICD-10-CM | POA: Diagnosis not present

## 2014-11-03 DIAGNOSIS — J069 Acute upper respiratory infection, unspecified: Secondary | ICD-10-CM | POA: Diagnosis not present

## 2015-01-02 DIAGNOSIS — Z1231 Encounter for screening mammogram for malignant neoplasm of breast: Secondary | ICD-10-CM | POA: Diagnosis not present

## 2015-01-21 ENCOUNTER — Ambulatory Visit (INDEPENDENT_AMBULATORY_CARE_PROVIDER_SITE_OTHER): Payer: Medicare Other | Admitting: Nurse Practitioner

## 2015-01-21 ENCOUNTER — Encounter: Payer: Self-pay | Admitting: Nurse Practitioner

## 2015-01-21 VITALS — BP 139/75 | HR 72 | Temp 97.1°F | Ht 63.0 in | Wt 183.0 lb

## 2015-01-21 DIAGNOSIS — M8580 Other specified disorders of bone density and structure, unspecified site: Secondary | ICD-10-CM

## 2015-01-21 DIAGNOSIS — E039 Hypothyroidism, unspecified: Secondary | ICD-10-CM

## 2015-01-21 DIAGNOSIS — E785 Hyperlipidemia, unspecified: Secondary | ICD-10-CM | POA: Diagnosis not present

## 2015-01-21 DIAGNOSIS — I10 Essential (primary) hypertension: Secondary | ICD-10-CM

## 2015-01-21 MED ORDER — FENOFIBRATE MICRONIZED 134 MG PO CAPS: 134.0000 mg | ORAL_CAPSULE | Freq: Every day | ORAL | Status: DC

## 2015-01-21 MED ORDER — HYDROCHLOROTHIAZIDE 25 MG PO TABS: ORAL_TABLET | ORAL | Status: DC

## 2015-01-21 MED ORDER — ROSUVASTATIN CALCIUM 10 MG PO TABS: 10.0000 mg | ORAL_TABLET | Freq: Every day | ORAL | Status: DC

## 2015-01-21 MED ORDER — LEVOTHYROXINE SODIUM 100 MCG PO TABS: 100.0000 ug | ORAL_TABLET | Freq: Every day | ORAL | Status: DC

## 2015-01-21 NOTE — Progress Notes (Signed)
Subjective:    Patient ID: Vanessa Hubbard, female    DOB: 1946-09-24, 69 y.o.   MRN: [id number]  Patient here today for follow up of chronic medical problems- no acute complaint.   Hyperlipidemia This is a chronic problem. The current episode started more than 1 year ago. The problem is controlled. Recent lipid tests were reviewed and are variable. Pertinent negatives include no myalgias or shortness of breath. Current antihyperlipidemic treatment includes statins, ezetimibe and fibric acid derivatives. The current treatment provides moderate improvement of lipids. Compliance problems include adherence to diet and adherence to exercise.  Risk factors for coronary artery disease include diabetes mellitus, dyslipidemia, hypertension and obesity.  Hypertension This is a chronic problem. The problem is controlled. Pertinent negatives include no palpitations or shortness of breath. Agents associated with hypertension include steroids. Risk factors for coronary artery disease include diabetes mellitus, dyslipidemia and stress. Past treatments include diuretics. The current treatment provides moderate improvement. Compliance problems include diet and exercise.  Hypertensive end-organ damage includes a thyroid problem.  Thyroid Problem Visit type: hypothyroidism. Patient reports no constipation or palpitations. The symptoms have been stable. The treatment provided mild relief. Her past medical history is significant for hyperlipidemia.  osteopenia Currently taking Vit D.    Review of Systems  Respiratory: Negative for shortness of breath.   Cardiovascular: Negative for palpitations.  Gastrointestinal: Negative for constipation.  Musculoskeletal: Negative for myalgias.  All other systems reviewed and are negative.      Objective:   Physical Exam  Constitutional: She is oriented to person, place, and time. She appears well-developed and well-nourished.  HENT:  Nose: Nose normal.  Mouth/Throat:  Oropharynx is clear and moist.  Eyes: EOM are normal.  Neck: Trachea normal, normal range of motion and full passive range of motion without pain. Neck supple. No JVD present. Carotid bruit is not present. No thyromegaly present.  Cardiovascular: Normal rate, regular rhythm, normal heart sounds and intact distal pulses.  Exam reveals no gallop and no friction rub.   No murmur heard. Pulmonary/Chest: Effort normal and breath sounds normal.  Abdominal: Soft. Bowel sounds are normal. She exhibits no distension and no mass. There is no tenderness.  Musculoskeletal: Normal range of motion.  Lymphadenopathy:    She has no cervical adenopathy.  Neurological: She is alert and oriented to person, place, and time. She has normal reflexes.  Skin: Skin is warm and dry.  Psychiatric: She has a normal mood and affect. Her behavior is normal. Judgment and thought content normal.    BP 139/75 mmHg  Pulse 72  Temp(Src) 97.1 F (36.2 C) (Oral)  Ht $R'5\' 3"'ka$  (1.6 m)  Wt 183 lb (83.008 kg)  BMI 32.43 kg/m2       Assessment & Plan:  1. Essential hypertension Low salt - NMR, lipoprofile - hydrochlorothiazide (HYDRODIURIL) 25 MG tablet; TAKE ONE TABLET BY MOUTH ONCE DAILY  Dispense: 90 tablet; Refill: 1  2. Osteopenia Wt bearing exercise  3. Hypothyroidism, unspecified hypothyroidism type Continue levothyroxine as prescribe.   4. Hyperlipidemia with target LDL less than 100 Low fat diet  - CMP14+EGFR - rosuvastatin (CRESTOR) 10 MG tablet; Take 1 tablet (10 mg total) by mouth daily.  Dispense: 30 tablet; Refill: 5 - fenofibrate micronized (LOFIBRA) 134 MG capsule; Take 1 capsule (134 mg total) by mouth daily before breakfast.  Dispense: 90 capsule; Refill: 1    Labs pending Health maintenance reviewed Diet and exercise encouraged Continue all meds Follow up  In 3 month  Mary-Margaret Hassell Done, FNP

## 2015-01-21 NOTE — Patient Instructions (Signed)

## 2015-01-22 LAB — CMP14+EGFR
ALBUMIN: 4.6 g/dL (ref 3.5–4.8)
ALK PHOS: 61 IU/L (ref 39–117)
ALT: 23 IU/L (ref 0–32)
AST: 31 IU/L (ref 0–40)
Albumin/Globulin Ratio: 2.4 (ref 1.1–2.5)
BILIRUBIN TOTAL: 0.9 mg/dL (ref 0.0–1.2)
BUN/Creatinine Ratio: 15 (ref 11–26)
BUN: 13 mg/dL (ref 8–27)
CO2: 25 mmol/L (ref 18–29)
Calcium: 10 mg/dL (ref 8.7–10.3)
Chloride: 101 mmol/L (ref 97–108)
Creatinine, Ser: 0.87 mg/dL (ref 0.57–1.00)
GFR, EST AFRICAN AMERICAN: 78 mL/min/{1.73_m2} (ref >59)
GFR, EST NON AFRICAN AMERICAN: 68 mL/min/{1.73_m2} (ref >59)
Globulin, Total: 1.9 g/dL (ref 1.5–4.5)
Glucose: 111 mg/dL — ABNORMAL HIGH (ref 65–99)
Potassium: 4.2 mmol/L (ref 3.5–5.2)
Sodium: 146 mmol/L — ABNORMAL HIGH (ref 134–144)
Total Protein: 6.5 g/dL (ref 6.0–8.5)

## 2015-01-22 LAB — NMR, LIPOPROFILE
CHOLESTEROL: 114 mg/dL (ref 100–199)
HDL Cholesterol by NMR: 74 mg/dL (ref >39)
HDL PARTICLE NUMBER: 55.1 umol/L (ref >=30.5)
LDL Particle Number: 369 nmol/L (ref <1000)
LDL Size: 19.7 nm (ref >20.5)
LDL-C: 23 mg/dL (ref 0–99)
LP-IR SCORE: 43 (ref <=45)
Small LDL Particle Number: 209 nmol/L (ref <=527)
Triglycerides by NMR: 84 mg/dL (ref 0–149)

## 2015-02-07 ENCOUNTER — Encounter: Payer: Self-pay | Admitting: *Deleted

## 2015-05-20 ENCOUNTER — Encounter: Payer: Self-pay | Admitting: Nurse Practitioner

## 2015-05-20 ENCOUNTER — Ambulatory Visit (INDEPENDENT_AMBULATORY_CARE_PROVIDER_SITE_OTHER): Payer: Medicare Other | Admitting: Nurse Practitioner

## 2015-05-20 VITALS — BP 141/80 | HR 80 | Temp 97.0°F | Ht 63.0 in | Wt 181.0 lb

## 2015-05-20 DIAGNOSIS — I10 Essential (primary) hypertension: Secondary | ICD-10-CM

## 2015-05-20 DIAGNOSIS — M8580 Other specified disorders of bone density and structure, unspecified site: Secondary | ICD-10-CM | POA: Diagnosis not present

## 2015-05-20 DIAGNOSIS — E785 Hyperlipidemia, unspecified: Secondary | ICD-10-CM | POA: Diagnosis not present

## 2015-05-20 DIAGNOSIS — E039 Hypothyroidism, unspecified: Secondary | ICD-10-CM | POA: Diagnosis not present

## 2015-05-20 DIAGNOSIS — Z6832 Body mass index (BMI) 32.0-32.9, adult: Secondary | ICD-10-CM | POA: Diagnosis not present

## 2015-05-20 MED ORDER — LEVOTHYROXINE SODIUM 100 MCG PO TABS: 100.0000 ug | ORAL_TABLET | Freq: Every day | ORAL | Status: DC

## 2015-05-20 MED ORDER — FENOFIBRATE MICRONIZED 134 MG PO CAPS: 134.0000 mg | ORAL_CAPSULE | Freq: Every day | ORAL | Status: DC

## 2015-05-20 MED ORDER — ROSUVASTATIN CALCIUM 10 MG PO TABS: 10.0000 mg | ORAL_TABLET | Freq: Every day | ORAL | Status: DC

## 2015-05-20 MED ORDER — HYDROCHLOROTHIAZIDE 25 MG PO TABS: ORAL_TABLET | ORAL | Status: DC

## 2015-05-20 NOTE — Progress Notes (Signed)
Subjective:    Patient ID: Vanessa Hubbard, female    DOB: Jun 06, 1946, 69 y.o.   MRN: [id number]  Patient here today for follow up of chronic medical problems- no acute complaint.   Hyperlipidemia This is a chronic problem. The current episode started more than 1 year ago. The problem is controlled. Recent lipid tests were reviewed and are variable. Pertinent negatives include no myalgias or shortness of breath. Current antihyperlipidemic treatment includes statins, ezetimibe and fibric acid derivatives. The current treatment provides moderate improvement of lipids. Compliance problems include adherence to diet and adherence to exercise.  Risk factors for coronary artery disease include diabetes mellitus, dyslipidemia, hypertension and obesity.  Hypertension This is a chronic problem. The problem is controlled. Pertinent negatives include no palpitations or shortness of breath. Agents associated with hypertension include steroids. Risk factors for coronary artery disease include diabetes mellitus, dyslipidemia and stress. Past treatments include diuretics. The current treatment provides moderate improvement. Compliance problems include diet and exercise.  Hypertensive end-organ damage includes a thyroid problem.  Thyroid Problem Visit type: hypothyroidism. Patient reports no constipation or palpitations. The symptoms have been stable. The treatment provided mild relief. Her past medical history is significant for hyperlipidemia.  osteopenia Currently taking Vit D.    Review of Systems  Respiratory: Negative for shortness of breath.   Cardiovascular: Negative for palpitations.  Gastrointestinal: Negative for constipation.  Musculoskeletal: Negative for myalgias.  All other systems reviewed and are negative.      Objective:   Physical Exam  Constitutional: She is oriented to person, place, and time. She appears well-developed and well-nourished.  HENT:  Nose: Nose normal.  Mouth/Throat:  Oropharynx is clear and moist.  Eyes: EOM are normal.  Neck: Trachea normal, normal range of motion and full passive range of motion without pain. Neck supple. No JVD present. Carotid bruit is not present. No thyromegaly present.  Cardiovascular: Normal rate, regular rhythm, normal heart sounds and intact distal pulses.  Exam reveals no gallop and no friction rub.   No murmur heard. Pulmonary/Chest: Effort normal and breath sounds normal.  Abdominal: Soft. Bowel sounds are normal. She exhibits no distension and no mass. There is no tenderness.  Musculoskeletal: Normal range of motion.  Lymphadenopathy:    She has no cervical adenopathy.  Neurological: She is alert and oriented to person, place, and time. She has normal reflexes.  Skin: Skin is warm and dry.  Psychiatric: She has a normal mood and affect. Her behavior is normal. Judgment and thought content normal.    BP 141/80 mmHg  Pulse 80  Temp(Src) 97 F (36.1 C) (Oral)  Ht $R'5\' 3"'OQ$  (1.6 m)  Wt 181 lb (82.101 kg)  BMI 32.07 kg/m2       Assessment & Plan:  1. Essential hypertension *2do not add slat to diet - CMP14+EGFR - hydrochlorothiazide (HYDRODIURIL) 25 MG tablet; TAKE ONE TABLET BY MOUTH ONCE DAILY  Dispense: 90 tablet; Refill: 1  2. Hypothyroidism, unspecified hypothyroidism type  - Thyroid Panel With TSH - levothyroxine (SYNTHROID, LEVOTHROID) 100 MCG tablet; Take 1 tablet (100 mcg total) by mouth daily.  Dispense: 90 tablet; Refill: 1  3. Osteopenia Weight bearing exercises  4. Hyperlipidemia with target LDL less than 100 Low fat diet - fenofibrate micronized (LOFIBRA) 134 MG capsule; Take 1 capsule (134 mg total) by mouth daily before breakfast.  Dispense: 90 capsule; Refill: 1 - Lipid panel - rosuvastatin (CRESTOR) 10 MG tablet; Take 1 tablet (10 mg total) by mouth daily.  Dispense:  30 tablet; Refill: 5  5. BMI Discussed diet and exercise for person with BMI >25 Will recheck weight in 3-6 months   Labs  pending Health maintenance reviewed Diet and exercise encouraged Continue all meds Follow up  In 3 months   [place], FNP

## 2015-05-21 LAB — CMP14+EGFR
ALT: 19 IU/L (ref 0–32)
AST: 22 IU/L (ref 0–40)
Albumin/Globulin Ratio: 2.1 (ref 1.1–2.5)
Albumin: 4.2 g/dL (ref 3.5–4.8)
Alkaline Phosphatase: 62 IU/L (ref 39–117)
BUN / CREAT RATIO: 17 (ref 11–26)
BUN: 14 mg/dL (ref 8–27)
Bilirubin Total: 0.6 mg/dL (ref 0.0–1.2)
CHLORIDE: 101 mmol/L (ref 97–108)
CO2: 25 mmol/L (ref 18–29)
Calcium: 9.7 mg/dL (ref 8.7–10.3)
Creatinine, Ser: 0.82 mg/dL (ref 0.57–1.00)
GFR calc Af Amer: 83 mL/min/{1.73_m2} (ref >59)
GFR calc non Af Amer: 72 mL/min/{1.73_m2} (ref >59)
GLOBULIN, TOTAL: 2 g/dL (ref 1.5–4.5)
Glucose: 112 mg/dL — ABNORMAL HIGH (ref 65–99)
Potassium: 4.3 mmol/L (ref 3.5–5.2)
Sodium: 142 mmol/L (ref 134–144)
TOTAL PROTEIN: 6.2 g/dL (ref 6.0–8.5)

## 2015-05-21 LAB — LIPID PANEL
CHOL/HDL RATIO: 1.8 ratio (ref 0.0–4.4)
CHOLESTEROL TOTAL: 115 mg/dL (ref 100–199)
HDL: 63 mg/dL (ref >39)
LDL Calculated: 29 mg/dL (ref 0–99)
Triglycerides: 113 mg/dL (ref 0–149)
VLDL Cholesterol Cal: 23 mg/dL (ref 5–40)

## 2015-05-21 LAB — THYROID PANEL WITH TSH
FREE THYROXINE INDEX: 3 (ref 1.2–4.9)
T3 UPTAKE RATIO: 27 % (ref 24–39)
T4, Total: 11.2 ug/dL (ref 4.5–12.0)
TSH: 0.968 u[IU]/mL (ref 0.450–4.500)

## 2015-08-27 ENCOUNTER — Encounter: Payer: Self-pay | Admitting: Nurse Practitioner

## 2015-08-27 ENCOUNTER — Ambulatory Visit (INDEPENDENT_AMBULATORY_CARE_PROVIDER_SITE_OTHER): Payer: Medicare Other | Admitting: Nurse Practitioner

## 2015-08-27 ENCOUNTER — Ambulatory Visit (INDEPENDENT_AMBULATORY_CARE_PROVIDER_SITE_OTHER): Payer: Medicare Other

## 2015-08-27 VITALS — BP 132/67 | HR 69 | Temp 97.6°F | Ht 63.0 in | Wt 183.0 lb

## 2015-08-27 DIAGNOSIS — Z23 Encounter for immunization: Secondary | ICD-10-CM | POA: Diagnosis not present

## 2015-08-27 DIAGNOSIS — E785 Hyperlipidemia, unspecified: Secondary | ICD-10-CM

## 2015-08-27 DIAGNOSIS — I10 Essential (primary) hypertension: Secondary | ICD-10-CM

## 2015-08-27 DIAGNOSIS — M8580 Other specified disorders of bone density and structure, unspecified site: Secondary | ICD-10-CM | POA: Diagnosis not present

## 2015-08-27 DIAGNOSIS — E039 Hypothyroidism, unspecified: Secondary | ICD-10-CM

## 2015-08-27 NOTE — Progress Notes (Signed)
  Subjective:    Patient ID: Vanessa Hubbard, female    DOB: 1946-01-01, 69 y.o.   MRN: [id number]  Patient here today for follow up of chronic medical problems- no acute complaint.   Hyperlipidemia This is a chronic problem. The current episode started more than 1 year ago. The problem is controlled. Recent lipid tests were reviewed and are variable. Pertinent negatives include no myalgias or shortness of breath. Current antihyperlipidemic treatment includes statins, ezetimibe and fibric acid derivatives. The current treatment provides moderate improvement of lipids. Compliance problems include adherence to diet and adherence to exercise.  Risk factors for coronary artery disease include diabetes mellitus, dyslipidemia, hypertension and obesity.  Hypertension This is a chronic problem. The problem is controlled. Pertinent negatives include no palpitations or shortness of breath. Agents associated with hypertension include steroids. Risk factors for coronary artery disease include diabetes mellitus, dyslipidemia and stress. Past treatments include diuretics. The current treatment provides moderate improvement. Compliance problems include diet and exercise.  Hypertensive end-organ damage includes a thyroid problem.  Thyroid Problem Visit type: hypothyroidism. Patient reports no constipation or palpitations. The symptoms have been stable. The treatment provided mild relief. Her past medical history is significant for hyperlipidemia.  osteopenia Currently taking Vit D.    Review of Systems  Constitutional: Negative.   HENT: Negative.   Respiratory: Negative for shortness of breath.   Cardiovascular: Negative for palpitations.  Gastrointestinal: Negative for constipation.  Genitourinary: Negative.   Musculoskeletal: Negative for myalgias.  Neurological: Negative.   Psychiatric/Behavioral: Negative.   All other systems reviewed and are negative.      Objective:   Physical Exam   Constitutional: She is oriented to person, place, and time. She appears well-developed and well-nourished.  HENT:  Nose: Nose normal.  Mouth/Throat: Oropharynx is clear and moist.  Eyes: EOM are normal.  Neck: Trachea normal, normal range of motion and full passive range of motion without pain. Neck supple. No JVD present. Carotid bruit is not present. No thyromegaly present.  Cardiovascular: Normal rate, regular rhythm, normal heart sounds and intact distal pulses.  Exam reveals no gallop and no friction rub.   No murmur heard. Pulmonary/Chest: Effort normal and breath sounds normal.  Abdominal: Soft. Bowel sounds are normal. She exhibits no distension and no mass. There is no tenderness.  Musculoskeletal: Normal range of motion.  Lymphadenopathy:    She has no cervical adenopathy.  Neurological: She is alert and oriented to person, place, and time. She has normal reflexes.  Skin: Skin is warm and dry.  Psychiatric: She has a normal mood and affect. Her behavior is normal. Judgment and thought content normal.    BP 132/67 mmHg  Pulse 69  Temp(Src) 97.6 F (36.4 C) (Oral)  Ht $R'5\' 3"'Vz$  (1.6 m)  Wt 183 lb (83.008 kg)  BMI 32.43 kg/m2  Chest x ray- no cardio-pulmonary abnormalities-Preliminary reading by Ronnald Collum, FNP  [facility]   EKG- Kerry Hough, FNP      Assessment & Plan:  1. Essential hypertension Do not add salt to diet - CMP14+EGFR - DG Chest 2 View; Future - EKG 12-Lead  2. Hypothyroidism, unspecified hypothyroidism type - Thyroid Panel With TSH  3. Hyperlipidemia with target LDL less than 100 Low fat diet - Lipid panel  4. Osteopenia Weight bearing exercises    Labs pending Health maintenance reviewed Diet and exercise encouraged Continue all meds Follow up  In 3 month   [place], FNP

## 2015-08-27 NOTE — Patient Instructions (Signed)

## 2015-08-28 DIAGNOSIS — Z23 Encounter for immunization: Secondary | ICD-10-CM | POA: Diagnosis not present

## 2015-08-28 LAB — LIPID PANEL
CHOL/HDL RATIO: 1.7 ratio (ref 0.0–4.4)
Cholesterol, Total: 125 mg/dL (ref 100–199)
HDL: 74 mg/dL (ref >39)
LDL CALC: 29 mg/dL (ref 0–99)
Triglycerides: 110 mg/dL (ref 0–149)
VLDL CHOLESTEROL CAL: 22 mg/dL (ref 5–40)

## 2015-08-28 LAB — THYROID PANEL WITH TSH
Free Thyroxine Index: 2.8 (ref 1.2–4.9)
T3 Uptake Ratio: 25 % (ref 24–39)
T4, Total: 11 ug/dL (ref 4.5–12.0)
TSH: 2.45 u[IU]/mL (ref 0.450–4.500)

## 2015-08-28 LAB — CMP14+EGFR
ALT: 21 IU/L (ref 0–32)
AST: 23 IU/L (ref 0–40)
Albumin/Globulin Ratio: 2.4 (ref 1.1–2.5)
Albumin: 4.5 g/dL (ref 3.5–4.8)
Alkaline Phosphatase: 63 IU/L (ref 39–117)
BUN / CREAT RATIO: 16 (ref 11–26)
BUN: 14 mg/dL (ref 8–27)
Bilirubin Total: 0.8 mg/dL (ref 0.0–1.2)
CALCIUM: 9.7 mg/dL (ref 8.7–10.3)
CO2: 29 mmol/L (ref 18–29)
Chloride: 100 mmol/L (ref 97–106)
Creatinine, Ser: 0.87 mg/dL (ref 0.57–1.00)
GFR, EST AFRICAN AMERICAN: 78 mL/min/{1.73_m2} (ref >59)
GFR, EST NON AFRICAN AMERICAN: 67 mL/min/{1.73_m2} (ref >59)
GLOBULIN, TOTAL: 1.9 g/dL (ref 1.5–4.5)
Glucose: 111 mg/dL — ABNORMAL HIGH (ref 65–99)
Potassium: 3.9 mmol/L (ref 3.5–5.2)
Sodium: 143 mmol/L (ref 136–144)
TOTAL PROTEIN: 6.4 g/dL (ref 6.0–8.5)

## 2015-08-28 NOTE — Addendum Note (Signed)
Addended by: Rolena Infante on: 08/28/2015 04:34 PM   Modules accepted: Orders

## 2015-10-08 ENCOUNTER — Ambulatory Visit (INDEPENDENT_AMBULATORY_CARE_PROVIDER_SITE_OTHER): Payer: Medicare Other | Admitting: Pharmacist Clinician (PhC)/ Clinical Pharmacy Specialist

## 2015-10-08 DIAGNOSIS — E785 Hyperlipidemia, unspecified: Secondary | ICD-10-CM

## 2015-10-08 NOTE — Progress Notes (Signed)
Patient comes in today with questions regarding her cholesterol medications.  She has had an increase in the cost of her fenofibrate that makes it cost prohibitive.  She is taking crestor 10mg  with fenofibrate, despite these medications no longer being on her med list.  With recent recommendations by the [facility] and [facility] regarding fibrates and lack of CV benefit and patient's normal TG readings I recommended she discontinue taking this medication.  Her last LDL-C readings were 29mg /dL so I have asked her to cut her crestor dose in half to 5mg  qd.  She is scheduled to see Shelah Lewandowsky in January to have her labs re-checked.  Patient has no history of ASCVD, diabetes, CVA or CKD.  She does not have a premature family history of CAD, her father had bypass surgery in his 27's.  No other family history present for ASCVD.  Patient has had significant myalgias that effect her QOL.  Patient has a history of pancreatitis related to gall bladder removed.  Because medications are not on her medication list I will add back crestor 10mg  1/2 tab qd.

## 2015-11-07 ENCOUNTER — Ambulatory Visit (INDEPENDENT_AMBULATORY_CARE_PROVIDER_SITE_OTHER): Payer: Medicare Other | Admitting: Family Medicine

## 2015-11-07 ENCOUNTER — Encounter: Payer: Self-pay | Admitting: Family Medicine

## 2015-11-07 VITALS — BP 127/64 | HR 77 | Temp 97.4°F | Ht 63.0 in | Wt 182.4 lb

## 2015-11-07 DIAGNOSIS — J329 Chronic sinusitis, unspecified: Secondary | ICD-10-CM | POA: Diagnosis not present

## 2015-11-07 DIAGNOSIS — J40 Bronchitis, not specified as acute or chronic: Secondary | ICD-10-CM

## 2015-11-07 MED ORDER — HYDROCODONE-HOMATROPINE 5-1.5 MG/5ML PO SYRP: 5.0000 mL | ORAL_SOLUTION | Freq: Four times a day (QID) | ORAL | Status: DC | PRN

## 2015-11-07 MED ORDER — AMOXICILLIN-POT CLAVULANATE 875-125 MG PO TABS: 1.0000 | ORAL_TABLET | Freq: Two times a day (BID) | ORAL | Status: DC

## 2015-11-07 NOTE — Progress Notes (Signed)
Subjective:  Patient ID: Vanessa Hubbard, female    DOB: 1945/12/13  Age: 69 y.o. MRN: [id number]  CC: URI   HPI Vanessa Hubbard presents for Patient presents with upper respiratory congestion. Rhinorrhea that is frequently purulent. There is moderate sore throat. Patient reports coughing frequently as well. yellow-colored/purulent sputum noted. No fever no chills no sweats. The patient denies being short of breath. Onset was 3-5 days ago. Gradually worsening in spite of home remedies.   History Vanessa Hubbard has a past medical history of Hypertension; Thyroid disease; Hyperlipidemia; Osteopenia; and Broken ankle (left).   Vanessa Hubbard has past surgical history that includes Ankle surgery; Abdominal hysterectomy; ORIF patella (01/22/2012); Knee surgery (Right); Cholecystectomy; and Tonsillectomy.   Vanessa Hubbard family history includes COPD in Vanessa Hubbard father; Cancer in Vanessa Hubbard mother; Healthy in Vanessa Hubbard daughter and son; Heart disease in Vanessa Hubbard father; Jaundice in Vanessa Hubbard mother; Thyroid disease in Vanessa Hubbard daughter.Vanessa Hubbard reports that Vanessa Hubbard has never smoked. Vanessa Hubbard has never used smokeless tobacco. Vanessa Hubbard reports that Vanessa Hubbard does not drink alcohol or use illicit drugs.  Outpatient Prescriptions Prior to Visit  Medication Sig Dispense Refill  . aspirin 81 MG tablet Take 81 mg by mouth daily.    . cetirizine (ZYRTEC) 10 MG tablet Take 1 tablet (10 mg total) by mouth daily as needed. Allergies. 90 tablet 1  . Cholecalciferol (VITAMIN D3) 2000 UNITS TABS Take 1 tablet by mouth.    . hydrochlorothiazide (HYDRODIURIL) 25 MG tablet TAKE ONE TABLET BY MOUTH ONCE DAILY 90 tablet 1  . levothyroxine (SYNTHROID, LEVOTHROID) 100 MCG tablet Take 1 tablet (100 mcg total) by mouth daily. 90 tablet 1  . Multiple Vitamin (MULTIVITAMIN) tablet Take 1 tablet by mouth daily.     No facility-administered medications prior to visit.    ROS Review of Systems  Constitutional: Negative for fever, chills, activity change and appetite change.  HENT: Positive for  congestion, postnasal drip, rhinorrhea and sinus pressure. Negative for ear discharge, ear pain, hearing loss, nosebleeds, sneezing and trouble swallowing.   Respiratory: Positive for cough. Negative for chest tightness and shortness of breath.   Cardiovascular: Negative for chest pain and palpitations.  Skin: Negative for rash.    Objective:  BP 127/64 mmHg  Pulse 77  Temp(Src) 97.4 F (36.3 C) (Oral)  Ht 5\' 3"  (1.6 m)  Wt 182 lb 6.4 oz (82.736 kg)  BMI 32.32 kg/m2  SpO2 97%  BP Readings from Last 3 Encounters:  11/07/15 127/64  08/27/15 132/67  05/20/15 141/80    Wt Readings from Last 3 Encounters:  11/07/15 182 lb 6.4 oz (82.736 kg)  08/27/15 183 lb (83.008 kg)  05/20/15 181 lb (82.101 kg)     Physical Exam  Constitutional: Vanessa Hubbard appears well-developed and well-nourished.  HENT:  Head: Normocephalic and atraumatic.  Right Ear: Tympanic membrane and external ear normal. No decreased hearing is noted.  Left Ear: Tympanic membrane and external ear normal. No decreased hearing is noted.  Nose: Mucosal edema present. Right sinus exhibits no frontal sinus tenderness. Left sinus exhibits no frontal sinus tenderness.  Mouth/Throat: No oropharyngeal exudate or posterior oropharyngeal erythema.  Neck: No Brudzinski's sign noted.  Pulmonary/Chest: Breath sounds normal. No respiratory distress.  Lymphadenopathy:       Head (right side): No preauricular adenopathy present.       Head (left side): No preauricular adenopathy present.       Right cervical: No superficial cervical adenopathy present.      Left cervical: No superficial cervical adenopathy present.  Lab Results  Component Value Date   WBC 5.8 07/05/2014   HGB 14.2 07/05/2014   HCT 41.7 07/05/2014   PLT 291 07/05/2014   GLUCOSE 111* 08/27/2015   CHOL 125 08/27/2015   TRIG 110 08/27/2015   HDL 74 08/27/2015   LDLCALC 29 08/27/2015   ALT 21 08/27/2015   AST 23 08/27/2015   NA 143 08/27/2015   K 3.9  08/27/2015   CL 100 08/27/2015   CREATININE 0.87 08/27/2015   BUN 14 08/27/2015   CO2 29 08/27/2015   TSH 2.450 08/27/2015   INR 1.56* 01/25/2012    Dg Chest 2 View  01/22/2012  *RADIOLOGY REPORT* Clinical Data: Preoperative respiratory exam.  Patella fracture. CHEST - 2 VIEW Comparison: None. Findings: The heart size and vascularity are normal and the lungs are clear.  No osseous abnormality. IMPRESSION: Normal chest. Original Report Authenticated By: Larey Seat, M.D.  Dg Knee Complete 4 Views Left  01/22/2012  *RADIOLOGY REPORT* Clinical Data: Recent fall with knee pain LEFT KNEE - COMPLETE 4+ VIEW Comparison: None. Findings:  There is a transverse fracture through the mid patella with distraction of the superior fragment cephalad by approximately 14 mm.  There is a joint effusion present.  Some loss of the medial joint space is noted. There is soft tissue swelling superficially anteriorly. IMPRESSION: Transverse distracted fracture of the mid patella with small effusion. Original Report Authenticated By: Joretta Bachelor, M.D.  Dg Knee Left Port  01/23/2012  *RADIOLOGY REPORT* Clinical Data: Status post internal fixation of patellar fracture. PORTABLE LEFT KNEE - 1-2 VIEW Comparison: Left knee radiographs performed earlier today at 02:56 p.m. Findings: There has been interval placement of two screws across the patellar fracture, seen in near-anatomic alignment.  No new fractures are seen. No definite knee joint effusion is characterized, though this is difficult to assess due to overlying soft tissue swelling. Overlying skin staples are seen.  Visualized joint spaces are grossly preserved. IMPRESSION: Interval internal fixation of patellar fracture, seen in near- anatomic alignment. Original Report Authenticated By: Santa Lighter, M.D.  Dg C-arm 1-60 Min  01/25/2012  *RADIOLOGY REPORT* Clinical Data: Patellar fracture. C-ARM 1-60 MIN Comparison: 01/22/2012. Findings: Two C-arm views  submitted for review after surgery.  This reveals reduction of the patellar fracture utilizing two screws. Minimal separation of fracture fragments. IMPRESSION: Open reduction and internal fixation of patellar fracture. Original Report Authenticated By: Doug Sou, M.D.   Assessment & Plan:   Thia was seen today for uri.  Diagnoses and all orders for this visit:  Sinobronchitis  Other orders -     amoxicillin-clavulanate (AUGMENTIN) 875-125 MG tablet; Take 1 tablet by mouth 2 (two) times daily. Take all of this medication -     HYDROcodone-homatropine (HYCODAN) 5-1.5 MG/5ML syrup; Take 5 mLs by mouth every 6 (six) hours as needed for cough.   I am having Vanessa Hubbard start on amoxicillin-clavulanate and HYDROcodone-homatropine. I am also having Vanessa Hubbard maintain Vanessa Hubbard Vitamin D3, multivitamin, aspirin, cetirizine, hydrochlorothiazide, levothyroxine, and rosuvastatin.  Meds ordered this encounter  Medications  . rosuvastatin (CRESTOR) 10 MG tablet    Sig: Take 5 mg by mouth daily.  Marland Kitchen amoxicillin-clavulanate (AUGMENTIN) 875-125 MG tablet    Sig: Take 1 tablet by mouth 2 (two) times daily. Take all of this medication    Dispense:  20 tablet    Refill:  0  . HYDROcodone-homatropine (HYCODAN) 5-1.5 MG/5ML syrup    Sig: Take 5 mLs by mouth every 6 (six)  hours as needed for cough.    Dispense:  120 mL    Refill:  0     Follow-up: Return if symptoms worsen or fail to improve.  Claretta Fraise, M.D.

## 2015-11-28 DIAGNOSIS — H2513 Age-related nuclear cataract, bilateral: Secondary | ICD-10-CM | POA: Diagnosis not present

## 2015-12-03 ENCOUNTER — Encounter: Payer: Self-pay | Admitting: Nurse Practitioner

## 2015-12-03 ENCOUNTER — Ambulatory Visit (INDEPENDENT_AMBULATORY_CARE_PROVIDER_SITE_OTHER): Payer: Medicare Other | Admitting: Nurse Practitioner

## 2015-12-03 VITALS — BP 132/76 | HR 82 | Temp 97.0°F | Ht 63.0 in | Wt 182.0 lb

## 2015-12-03 DIAGNOSIS — I10 Essential (primary) hypertension: Secondary | ICD-10-CM | POA: Diagnosis not present

## 2015-12-03 DIAGNOSIS — M8580 Other specified disorders of bone density and structure, unspecified site: Secondary | ICD-10-CM | POA: Diagnosis not present

## 2015-12-03 DIAGNOSIS — E785 Hyperlipidemia, unspecified: Secondary | ICD-10-CM | POA: Diagnosis not present

## 2015-12-03 DIAGNOSIS — E039 Hypothyroidism, unspecified: Secondary | ICD-10-CM | POA: Diagnosis not present

## 2015-12-03 DIAGNOSIS — Z6832 Body mass index (BMI) 32.0-32.9, adult: Secondary | ICD-10-CM

## 2015-12-03 MED ORDER — HYDROCHLOROTHIAZIDE 25 MG PO TABS: ORAL_TABLET | ORAL | Status: DC

## 2015-12-03 MED ORDER — LEVOTHYROXINE SODIUM 100 MCG PO TABS: 100.0000 ug | ORAL_TABLET | Freq: Every day | ORAL | Status: DC

## 2015-12-03 NOTE — Patient Instructions (Signed)
Health Maintenance, Female Adopting a healthy lifestyle and getting preventive care can go a long way to promote health and wellness. Talk with your health care provider about what schedule of regular examinations is right for you. This is a good chance for you to check in with your provider about disease prevention and staying healthy. In between checkups, there are plenty of things you can do on your own. Experts have done a lot of research about which lifestyle changes and preventive measures are most likely to keep you healthy. Ask your health care provider for more information. WEIGHT AND DIET  Eat a healthy diet  Be sure to include plenty of vegetables, fruits, low-fat dairy products, and lean protein.  Do not eat a lot of foods high in solid fats, added sugars, or salt.  Get regular exercise. This is one of the most important things you can do for your health.  Most adults should exercise for at least 150 minutes each week. The exercise should increase your heart rate and make you sweat (moderate-intensity exercise).  Most adults should also do strengthening exercises at least twice a week. This is in addition to the moderate-intensity exercise.  Maintain a healthy weight  Body mass index (BMI) is a measurement that can be used to identify possible weight problems. It estimates body fat based on height and weight. Your health care provider can help determine your BMI and help you achieve or maintain a healthy weight.  For females 20 years of age and older:   A BMI below 18.5 is considered underweight.  A BMI of 18.5 to 24.9 is normal.  A BMI of 25 to 29.9 is considered overweight.  A BMI of 30 and above is considered obese.  Watch levels of cholesterol and blood lipids  You should start having your blood tested for lipids and cholesterol at 72 years of age, then have this test every 5 years.  You may need to have your cholesterol levels checked more often if:  Your lipid  or cholesterol levels are high.  You are older than 72 years of age.  You are at high risk for heart disease.  CANCER SCREENING   Lung Cancer  Lung cancer screening is recommended for adults 55-80 years old who are at high risk for lung cancer because of a history of smoking.  A yearly low-dose CT scan of the lungs is recommended for people who:  Currently smoke.  Have quit within the past 15 years.  Have at least a 30-pack-year history of smoking. A pack year is smoking an average of one pack of cigarettes a day for 1 year.  Yearly screening should continue until it has been 15 years since you quit.  Yearly screening should stop if you develop a health problem that would prevent you from having lung cancer treatment.  Breast Cancer  Practice breast self-awareness. This means understanding how your breasts normally appear and feel.  It also means doing regular breast self-exams. Let your health care provider know about any changes, no matter how small.  If you are in your 20s or 30s, you should have a clinical breast exam (CBE) by a health care provider every 1-3 years as part of a regular health exam.  If you are 40 or older, have a CBE every year. Also consider having a breast X-ray (mammogram) every year.  If you have a family history of breast cancer, talk to your health care provider about genetic screening.  If you   are at high risk for breast cancer, talk to your health care provider about having an MRI and a mammogram every year.  Breast cancer gene (BRCA) assessment is recommended for women who have family members with BRCA-related cancers. BRCA-related cancers include:  Breast.  Ovarian.  Tubal.  Peritoneal cancers.  Results of the assessment will determine the need for genetic counseling and BRCA1 and BRCA2 testing. Cervical Cancer Your health care provider may recommend that you be screened regularly for cancer of the pelvic organs (ovaries, uterus, and  vagina). This screening involves a pelvic examination, including checking for microscopic changes to the surface of your cervix (Pap test). You may be encouraged to have this screening done every 3 years, beginning at age 49.  For women ages 64-65, health care providers may recommend pelvic exams and Pap testing every 3 years, or they may recommend the Pap and pelvic exam, combined with testing for human papilloma virus (HPV), every 5 years. Some types of HPV increase your risk of cervical cancer. Testing for HPV may also be done on women of any age with unclear Pap test results.  Other health care providers may not recommend any screening for nonpregnant women who are considered low risk for pelvic cancer and who do not have symptoms. Ask your health care provider if a screening pelvic exam is right for you.  If you have had past treatment for cervical cancer or a condition that could lead to cancer, you need Pap tests and screening for cancer for at least 20 years after your treatment. If Pap tests have been discontinued, your risk factors (such as having a new sexual partner) need to be reassessed to determine if screening should resume. Some women have medical problems that increase the chance of getting cervical cancer. In these cases, your health care provider may recommend more frequent screening and Pap tests. Colorectal Cancer  This type of cancer can be detected and often prevented.  Routine colorectal cancer screening usually begins at 72 years of age and continues through 72 years of age.  Your health care provider may recommend screening at an earlier age if you have risk factors for colon cancer.  Your health care provider may also recommend using home test kits to check for hidden blood in the stool.  A small camera at the end of a tube can be used to examine your colon directly (sigmoidoscopy or colonoscopy). This is done to check for the earliest forms of colorectal  cancer.  Routine screening usually begins at age 53.  Direct examination of the colon should be repeated every 5-10 years through 72 years of age. However, you may need to be screened more often if early forms of precancerous polyps or small growths are found. Skin Cancer  Check your skin from head to toe regularly.  Tell your health care provider about any new moles or changes in moles, especially if there is a change in a mole's shape or color.  Also tell your health care provider if you have a mole that is larger than the size of a pencil eraser.  Always use sunscreen. Apply sunscreen liberally and repeatedly throughout the day.  Protect yourself by wearing long sleeves, pants, a wide-brimmed hat, and sunglasses whenever you are outside. HEART DISEASE, DIABETES, AND HIGH BLOOD PRESSURE   High blood pressure causes heart disease and increases the risk of stroke. High blood pressure is more likely to develop in:  People who have blood pressure in the high end  of the normal range (130-139/85-89 mm Hg).  People who are overweight or obese.  People who are African American.  If you are 38-23 years of age, have your blood pressure checked every 3-5 years. If you are 61 years of age or older, have your blood pressure checked every year. You should have your blood pressure measured twice--once when you are at a hospital or clinic, and once when you are not at a hospital or clinic. Record the average of the two measurements. To check your blood pressure when you are not at a hospital or clinic, you can use:  An automated blood pressure machine at a pharmacy.  A home blood pressure monitor.  If you are between 45 years and 39 years old, ask your health care provider if you should take aspirin to prevent strokes.  Have regular diabetes screenings. This involves taking a blood sample to check your fasting blood sugar level.  If you are at a normal weight and have a low risk for diabetes,  have this test once every three years after 72 years of age.  If you are overweight and have a high risk for diabetes, consider being tested at a younger age or more often. PREVENTING INFECTION  Hepatitis B  If you have a higher risk for hepatitis B, you should be screened for this virus. You are considered at high risk for hepatitis B if:  You were born in a country where hepatitis B is common. Ask your health care provider which countries are considered high risk.  Your parents were born in a high-risk country, and you have not been immunized against hepatitis B (hepatitis B vaccine).  You have HIV or AIDS.  You use needles to inject street drugs.  You live with someone who has hepatitis B.  You have had sex with someone who has hepatitis B.  You get hemodialysis treatment.  You take certain medicines for conditions, including cancer, organ transplantation, and autoimmune conditions. Hepatitis C  Blood testing is recommended for:  Everyone born from [birth year] through 1967.  Anyone with known risk factors for hepatitis C. Sexually transmitted infections (STIs)  You should be screened for sexually transmitted infections (STIs) including gonorrhea and chlamydia if:  You are sexually active and are younger than 72 years of age.  You are older than 72 years of age and your health care provider tells you that you are at risk for this type of infection.  Your sexual activity has changed since you were last screened and you are at an increased risk for chlamydia or gonorrhea. Ask your health care provider if you are at risk.  If you do not have HIV, but are at risk, it may be recommended that you take a prescription medicine daily to prevent HIV infection. This is called pre-exposure prophylaxis (PrEP). You are considered at risk if:  You are sexually active and do not regularly use condoms or know the HIV status of your partner(s).  You take drugs by injection.  You are sexually  active with a partner who has HIV. Talk with your health care provider about whether you are at high risk of being infected with HIV. If you choose to begin PrEP, you should first be tested for HIV. You should then be tested every 3 months for as long as you are taking PrEP.  PREGNANCY   If you are premenopausal and you may become pregnant, ask your health care provider about preconception counseling.  If you may  become pregnant, take 400 to 800 micrograms (mcg) of folic acid every day.  If you want to prevent pregnancy, talk to your health care provider about birth control (contraception). OSTEOPOROSIS AND MENOPAUSE   Osteoporosis is a disease in which the bones lose minerals and strength with aging. This can result in serious bone fractures. Your risk for osteoporosis can be identified using a bone density scan.  If you are 43 years of age or older, or if you are at risk for osteoporosis and fractures, ask your health care provider if you should be screened.  Ask your health care provider whether you should take a calcium or vitamin D supplement to lower your risk for osteoporosis.  Menopause may have certain physical symptoms and risks.  Hormone replacement therapy may reduce some of these symptoms and risks. Talk to your health care provider about whether hormone replacement therapy is right for you.  HOME CARE INSTRUCTIONS   Schedule regular health, dental, and eye exams.  Stay current with your immunizations.   Do not use any tobacco products including cigarettes, chewing tobacco, or electronic cigarettes.  If you are pregnant, do not drink alcohol.  If you are breastfeeding, limit how much and how often you drink alcohol.  Limit alcohol intake to no more than 1 drink per day for nonpregnant women. One drink equals 12 ounces of beer, 5 ounces of wine, or 1 ounces of hard liquor.  Do not use street drugs.  Do not share needles.  Ask your health care provider for help if  you need support or information about quitting drugs.  Tell your health care provider if you often feel depressed.  Tell your health care provider if you have ever been abused or do not feel safe at home.   This information is not intended to replace advice given to you by your health care provider. Make sure you discuss any questions you have with your health care provider.   Document Released: 05/11/2011 Document Revised: 11/16/2014 Document Reviewed: 09/27/2013 Elsevier Interactive Patient Education [employer].

## 2015-12-03 NOTE — Progress Notes (Signed)
Subjective:    Patient ID: Vanessa Hubbard, female    DOB: [birth year], 70 y.o.   MRN: [id number]  Patient here today for follow up of chronic medical problems.  Outpatient Encounter Prescriptions as of 12/03/2015  Medication Sig  . aspirin 81 MG tablet Take 81 mg by mouth daily.  . cetirizine (ZYRTEC) 10 MG tablet Take 1 tablet (10 mg total) by mouth daily as needed. Allergies.  . Cholecalciferol (VITAMIN D3) 2000 UNITS TABS Take 1 tablet by mouth.  . hydrochlorothiazide (HYDRODIURIL) 25 MG tablet TAKE ONE TABLET BY MOUTH ONCE DAILY  . levothyroxine (SYNTHROID, LEVOTHROID) 100 MCG tablet Take 1 tablet (100 mcg total) by mouth daily.  . Multiple Vitamin (MULTIVITAMIN) tablet Take 1 tablet by mouth daily.  . rosuvastatin (CRESTOR) 10 MG tablet Take 5 mg by mouth daily.  . [DISCONTINUED] amoxicillin-clavulanate (AUGMENTIN) 875-125 MG tablet Take 1 tablet by mouth 2 (two) times daily. Take all of this medication  . [DISCONTINUED] HYDROcodone-homatropine (HYCODAN) 5-1.5 MG/5ML syrup Take 5 mLs by mouth every 6 (six) hours as needed for cough.   No facility-administered encounter medications on file as of 12/03/2015.     * She saw clinical pharmacist and she stopped her fenofibrate and cut her crestor in half because she said that her labs looked good. She has been off of fenofibrate since November 29,2017. We will recheck labs today.  Hyperlipidemia This is a chronic problem. The current episode started more than 1 year ago. The problem is controlled. Recent lipid tests were reviewed and are variable. Pertinent negatives include no myalgias or shortness of breath. Current antihyperlipidemic treatment includes statins, ezetimibe and fibric acid derivatives. The current treatment provides moderate improvement of lipids. Compliance problems include adherence to diet and adherence to exercise.  Risk factors for coronary artery disease include diabetes mellitus, dyslipidemia, hypertension and obesity.   Hypertension This is a chronic problem. The problem is controlled. Pertinent negatives include no palpitations or shortness of breath. Agents associated with hypertension include steroids. Risk factors for coronary artery disease include diabetes mellitus, dyslipidemia and stress. Past treatments include diuretics. The current treatment provides moderate improvement. Compliance problems include diet and exercise.  Hypertensive end-organ damage includes a thyroid problem.  Thyroid Problem Visit type: hypothyroidism. Patient reports no constipation or palpitations. The symptoms have been stable. The treatment provided mild relief. Her past medical history is significant for hyperlipidemia.  osteopenia Currently taking Vit D. No weight bearing exercises   Review of Systems  Constitutional: Negative.   HENT: Negative.   Respiratory: Negative for shortness of breath.   Cardiovascular: Negative for palpitations.  Gastrointestinal: Negative for constipation.  Genitourinary: Negative.   Musculoskeletal: Negative for myalgias.  Neurological: Negative.   Psychiatric/Behavioral: Negative.   All other systems reviewed and are negative.      Objective:   Physical Exam  Constitutional: She is oriented to person, place, and time. She appears well-developed and well-nourished.  HENT:  Nose: Nose normal.  Mouth/Throat: Oropharynx is clear and moist.  Eyes: EOM are normal.  Neck: Trachea normal, normal range of motion and full passive range of motion without pain. Neck supple. No JVD present. Carotid bruit is not present. No thyromegaly present.  Cardiovascular: Normal rate, regular rhythm, normal heart sounds and intact distal pulses.  Exam reveals no gallop and no friction rub.   No murmur heard. Pulmonary/Chest: Effort normal and breath sounds normal.  Abdominal: Soft. Bowel sounds are normal. She exhibits no distension and no mass. There is no tenderness.  Musculoskeletal: Normal range of  motion.  Lymphadenopathy:    She has no cervical adenopathy.  Neurological: She is alert and oriented to person, place, and time. She has normal reflexes.  Skin: Skin is warm and dry.  Psychiatric: She has a normal mood and affect. Her behavior is normal. Judgment and thought content normal.   BP 132/76 mmHg  Pulse 82  Temp(Src) 97 F (36.1 C) (Oral)  Ht _0  (1.6 m)  Wt 182 lb (82.555 kg)  BMI 32.25 kg/m2        Assessment & Plan:  1. Essential hypertension donot add salt to diet - CMP14+EGFR - hydrochlorothiazide (HYDRODIURIL) 25 MG tablet; TAKE ONE TABLET BY MOUTH ONCE DAILY  Dispense: 90 tablet; Refill: 1  2. Hypothyroidism, unspecified hypothyroidism type - levothyroxine (SYNTHROID, LEVOTHROID) 100 MCG tablet; Take 1 tablet (100 mcg total) by mouth daily.  Dispense: 90 tablet; Refill: 1 - Thyroid Panel With TSH  3. Osteopenia Weight bearing exercises Keep appointment for dexa in march  4. Hyperlipidemia with target LDL less than 100 Labs pending will discuss meds when labs are back - Lipid panel  5. BMI 32.0-32.9,adult Discussed diet and exercise for person with BMI >25 Will recheck weight in 3-6 months     Labs pending Health maintenance reviewed Diet and exercise encouraged Continue all meds Follow up  In 3 months   [place], FNP

## 2015-12-04 LAB — CMP14+EGFR
A/G RATIO: 2.3 (ref 1.1–2.5)
ALBUMIN: 4.4 g/dL (ref 3.5–4.8)
ALK PHOS: 79 IU/L (ref 39–117)
ALT: 18 IU/L (ref 0–32)
AST: 21 IU/L (ref 0–40)
BILIRUBIN TOTAL: 1.5 mg/dL — AB (ref 0.0–1.2)
BUN / CREAT RATIO: 20 (ref 11–26)
BUN: 16 mg/dL (ref 8–27)
CHLORIDE: 98 mmol/L (ref 96–106)
CO2: 24 mmol/L (ref 18–29)
Calcium: 9.4 mg/dL (ref 8.7–10.3)
Creatinine, Ser: 0.81 mg/dL (ref 0.57–1.00)
GFR calc non Af Amer: 73 mL/min/{1.73_m2} (ref >59)
GFR, EST AFRICAN AMERICAN: 85 mL/min/{1.73_m2} (ref >59)
GLOBULIN, TOTAL: 1.9 g/dL (ref 1.5–4.5)
GLUCOSE: 121 mg/dL — AB (ref 65–99)
POTASSIUM: 3.8 mmol/L (ref 3.5–5.2)
SODIUM: 142 mmol/L (ref 134–144)
TOTAL PROTEIN: 6.3 g/dL (ref 6.0–8.5)

## 2015-12-04 LAB — THYROID PANEL WITH TSH
FREE THYROXINE INDEX: 2.4 (ref 1.2–4.9)
T3 UPTAKE RATIO: 26 % (ref 24–39)
T4 TOTAL: 9.1 ug/dL (ref 4.5–12.0)
TSH: 3.1 u[IU]/mL (ref 0.450–4.500)

## 2015-12-04 LAB — LIPID PANEL
CHOLESTEROL TOTAL: 130 mg/dL (ref 100–199)
Chol/HDL Ratio: 1.9 ratio units (ref 0.0–4.4)
HDL: 70 mg/dL (ref >39)
LDL Calculated: 29 mg/dL (ref 0–99)
Triglycerides: 157 mg/dL — ABNORMAL HIGH (ref 0–149)
VLDL Cholesterol Cal: 31 mg/dL (ref 5–40)

## 2016-01-07 ENCOUNTER — Telehealth: Payer: Self-pay | Admitting: Nurse Practitioner

## 2016-01-07 ENCOUNTER — Encounter: Payer: Medicare Other | Admitting: *Deleted

## 2016-01-07 DIAGNOSIS — J301 Allergic rhinitis due to pollen: Secondary | ICD-10-CM

## 2016-01-07 DIAGNOSIS — Z1231 Encounter for screening mammogram for malignant neoplasm of breast: Secondary | ICD-10-CM | POA: Diagnosis not present

## 2016-01-07 LAB — HM MAMMOGRAPHY

## 2016-01-07 MED ORDER — CETIRIZINE HCL 10 MG PO TABS: 10.0000 mg | ORAL_TABLET | Freq: Every day | ORAL | Status: DC | PRN

## 2016-01-07 NOTE — Telephone Encounter (Signed)
Pt aware rx for zyrtec sent to the pharmacy.

## 2016-01-10 ENCOUNTER — Encounter: Payer: Self-pay | Admitting: *Deleted

## 2016-01-13 ENCOUNTER — Ambulatory Visit: Payer: Medicare Other | Admitting: Family Medicine

## 2016-01-13 ENCOUNTER — Ambulatory Visit (INDEPENDENT_AMBULATORY_CARE_PROVIDER_SITE_OTHER): Payer: Medicare Other | Admitting: Family

## 2016-01-13 ENCOUNTER — Encounter: Payer: Self-pay | Admitting: Family

## 2016-01-13 VITALS — BP 128/67 | HR 78 | Temp 98.4°F | Ht 63.0 in | Wt 180.4 lb

## 2016-01-13 DIAGNOSIS — R6889 Other general symptoms and signs: Secondary | ICD-10-CM | POA: Diagnosis not present

## 2016-01-13 DIAGNOSIS — J209 Acute bronchitis, unspecified: Secondary | ICD-10-CM

## 2016-01-13 LAB — VERITOR FLU A/B WAIVED
INFLUENZA A: NEGATIVE
Influenza B: NEGATIVE

## 2016-01-13 MED ORDER — PREDNISONE 10 MG (21) PO TBPK: 10.0000 mg | ORAL_TABLET | Freq: Every day | ORAL | Status: DC

## 2016-01-13 MED ORDER — FLUTICASONE PROPIONATE 50 MCG/ACT NA SUSP: 2.0000 | Freq: Every day | NASAL | Status: AC

## 2016-01-13 NOTE — Progress Notes (Signed)
Subjective:    Patient ID: Vanessa Hubbard, female    DOB: 1945-11-29, 70 y.o.   MRN: [id number]  Headache  Associated symptoms include coughing and a sore throat. Pertinent negatives include no ear pain, fever or rhinorrhea.  Cough This is a new problem. The current episode started in the past 7 days (Saturday). The problem has been unchanged. The problem occurs every few minutes. The cough is non-productive. Associated symptoms include chills, headaches, myalgias, postnasal drip, a sore throat, shortness of breath and wheezing. Pertinent negatives include no ear congestion, ear pain, fever, nasal congestion or rhinorrhea. The symptoms are aggravated by lying down. She has tried OTC cough suppressant and rest for the symptoms. The treatment provided mild relief. There is no history of asthma or COPD.      Review of Systems  Constitutional: Positive for chills. Negative for fever.  HENT: Positive for postnasal drip and sore throat. Negative for ear pain and rhinorrhea.   Eyes: Negative.   Respiratory: Positive for cough, shortness of breath and wheezing.   Cardiovascular: Negative.  Negative for palpitations.  Gastrointestinal: Negative.   Endocrine: Negative.   Genitourinary: Negative.   Musculoskeletal: Positive for myalgias.  Neurological: Positive for headaches.  Hematological: Negative.   Psychiatric/Behavioral: Negative.   All other systems reviewed and are negative.      Objective:   Physical Exam  Constitutional: She is oriented to person, place, and time. She appears well-developed and well-nourished. No distress.  HENT:  Head: Normocephalic and atraumatic.  Right Ear: External ear normal.  Left Ear: External ear normal.  Nasal passage erythemas with mild swelling  Oropharynx erythemas  Eyes: Pupils are equal, round, and reactive to light.  Neck: Normal range of motion. Neck supple. No thyromegaly present.  Cardiovascular: Normal rate, regular rhythm, normal heart  sounds and intact distal pulses.   No murmur heard. Pulmonary/Chest: Effort normal and breath sounds normal. No respiratory distress. She has no wheezes.  Intermittent coarse, nonproductive cough  Abdominal: Soft. Bowel sounds are normal. She exhibits no distension. There is no tenderness.  Musculoskeletal: Normal range of motion. She exhibits no edema or tenderness.  Neurological: She is alert and oriented to person, place, and time. She has normal reflexes. No cranial nerve deficit.  Skin: Skin is warm and dry.  Psychiatric: She has a normal mood and affect. Her behavior is normal. Judgment and thought content normal.  Vitals reviewed.     BP 128/67 mmHg  Pulse 78  Temp(Src) 98.4 F (36.9 C) (Oral)  Ht 5\' 3"  (1.6 m)  Wt 180 lb 6.4 oz (81.829 kg)  BMI 31.96 kg/m2     Assessment & Plan:  1. Flu-like symptoms  - Veritor Flu A/B Waived  2. Acute bronchitis, unspecified organism -- Take meds as prescribed - Use a cool mist humidifier  -Use saline nose sprays frequently -Saline irrigations of the nose can be very helpful if done frequently.  * 4X daily for 1 week*  * Use of a nettie pot can be helpful with this. Follow directions with this* -Force fluids -For any cough or congestion  Use plain Mucinex- regular strength or max strength is fine   * Children- consult with Pharmacist for dosing -For fever or aces or pains- take tylenol or ibuprofen appropriate for age and weight.  * for fevers greater than 101 orally you may alternate ibuprofen and tylenol every  3 hours. -Throat lozenges if help -New toothbrush in 3 days - predniSONE (STERAPRED UNI-PAK 21  TAB) 10 MG (21) TBPK tablet; Take 1 tablet (10 mg total) by mouth daily. As directed x 6 days  Dispense: 21 tablet; Refill: 0 - fluticasone (FLONASE) 50 MCG/ACT nasal spray; Place 2 sprays into both nostrils daily.  Dispense: 16 g; Refill: Burns, FNP

## 2016-01-13 NOTE — Patient Instructions (Signed)
Acute Bronchitis Bronchitis is inflammation of the airways that extend from the windpipe into the lungs (bronchi). The inflammation often causes mucus to develop. This leads to a cough, which is the most common symptom of bronchitis.  In acute bronchitis, the condition usually develops suddenly and goes away over time, usually in a couple weeks. Smoking, allergies, and asthma can make bronchitis worse. Repeated episodes of bronchitis may cause further lung problems.  CAUSES Acute bronchitis is most often caused by the same virus that causes a cold. The virus can spread from person to person (contagious) through coughing, sneezing, and touching contaminated objects. SIGNS AND SYMPTOMS   Cough.   Fever.   Coughing up mucus.   Body aches.   Chest congestion.   Chills.   Shortness of breath.   Sore throat.  DIAGNOSIS  Acute bronchitis is usually diagnosed through a physical exam. Your health care provider will also ask you questions about your medical history. Tests, such as chest X-rays, are sometimes done to rule out other conditions.  TREATMENT  Acute bronchitis usually goes away in a couple weeks. Oftentimes, no medical treatment is necessary. Medicines are sometimes given for relief of fever or cough. Antibiotic medicines are usually not needed but may be prescribed in certain situations. In some cases, an inhaler may be recommended to help reduce shortness of breath and control the cough. A cool mist vaporizer may also be used to help thin bronchial secretions and make it easier to clear the chest.  HOME CARE INSTRUCTIONS  Get plenty of rest.   Drink enough fluids to keep your urine clear or pale yellow (unless you have a medical condition that requires fluid restriction). Increasing fluids may help thin your respiratory secretions (sputum) and reduce chest congestion, and it will prevent dehydration.   Take medicines only as directed by your health care provider.  If  you were prescribed an antibiotic medicine, finish it all even if you start to feel better.  Avoid smoking and secondhand smoke. Exposure to cigarette smoke or irritating chemicals will make bronchitis worse. If you are a smoker, consider using nicotine gum or skin patches to help control withdrawal symptoms. Quitting smoking will help your lungs heal faster.   Reduce the chances of another bout of acute bronchitis by washing your hands frequently, avoiding people with cold symptoms, and trying not to touch your hands to your mouth, nose, or eyes.   Keep all follow-up visits as directed by your health care provider.  SEEK MEDICAL CARE IF: Your symptoms do not improve after 1 week of treatment.  SEEK IMMEDIATE MEDICAL CARE IF:  You develop an increased fever or chills.   You have chest pain.   You have severe shortness of breath.  You have bloody sputum.   You develop dehydration.  You faint or repeatedly feel like you are going to pass out.  You develop repeated vomiting.  You develop a severe headache. MAKE SURE YOU:   Understand these instructions.  Will watch your condition.  Will get help right away if you are not doing well or get worse.   This information is not intended to replace advice given to you by your health care provider. Make sure you discuss any questions you have with your health care provider.   Document Released: 12/03/2004 Document Revised: 11/16/2014 Document Reviewed: 04/18/2013 Elsevier Interactive Patient Education 2016 [employer]  - Take meds as prescribed - Use a cool mist humidifier  -Use saline nose sprays frequently -Saline   irrigations of the nose can be very helpful if done frequently.  * 4X daily for 1 week*  * Use of a nettie pot can be helpful with this. Follow directions with this* -Force fluids -For any cough or congestion  Use plain Mucinex- regular strength or max strength is fine   * Children- consult with Pharmacist for  dosing -For fever or aces or pains- take tylenol or ibuprofen appropriate for age and weight.  * for fevers greater than 101 orally you may alternate ibuprofen and tylenol every  3 hours. -Throat lozenges if help    Christy Hawks, FNP  

## 2016-01-21 ENCOUNTER — Ambulatory Visit (INDEPENDENT_AMBULATORY_CARE_PROVIDER_SITE_OTHER): Payer: Medicare Other | Admitting: Pediatrics

## 2016-01-21 ENCOUNTER — Encounter: Payer: Self-pay | Admitting: Pediatrics

## 2016-01-21 VITALS — BP 123/74 | HR 76 | Temp 97.9°F | Ht 63.0 in | Wt 180.4 lb

## 2016-01-21 DIAGNOSIS — J309 Allergic rhinitis, unspecified: Secondary | ICD-10-CM | POA: Diagnosis not present

## 2016-01-21 DIAGNOSIS — J069 Acute upper respiratory infection, unspecified: Secondary | ICD-10-CM | POA: Diagnosis not present

## 2016-01-21 NOTE — Progress Notes (Signed)
    Subjective:    Patient ID: Vanessa Hubbard, female    DOB: 1946-03-22, 71 y.o.   MRN: [id number]  CC: Cough and Nasal Congestion   HPI: Vanessa Hubbard is a 70 y.o. female presenting for Cough and Nasal Congestion  Congestion better from 1 week ago Still with thick plegm in the back of her throat Was takin gprednisone Still feeling nauseated all day Appetite is poor, has been something each meal Drinking plenty of fluids Can't take mucinex No fevers No flu last week when tested Started getting sick 8-10 days ago   Depression screen [facility] 2/9 01/22/2016 01/21/2016 12/03/2015 11/07/2015 08/27/2015  Decreased Interest 0 0 0 0 0  Down, Depressed, Hopeless 0 0 0 0 0  PHQ - 2 Score 0 0 0 0 0     Relevant past medical, surgical, family and social history reviewed and updated as indicated. Interim medical history since our last visit reviewed. Allergies and medications reviewed and updated.    ROS: Per HPI unless specifically indicated above  History  Smoking status  . Never Smoker   Smokeless tobacco  . Never Used    Past Medical History Patient Active Problem List   Diagnosis Date Noted  . BMI 32.0-32.9,adult 12/03/2015  . Essential hypertension 07/05/2014  . Osteopenia 01/17/2014  . Hypothyroidism 03/03/2013  . Hyperlipidemia with target LDL less than 100 03/03/2013        Objective:    BP 123/74 mmHg  Pulse 76  Temp(Src) 97.9 F (36.6 C) (Oral)  Ht 5\' 3"  (1.6 m)  Wt 180 lb 6.4 oz (81.829 kg)  BMI 31.96 kg/m2  SpO2 97%  Wt Readings from Last 3 Encounters:  01/22/16 181 lb (82.101 kg)  01/21/16 180 lb 6.4 oz (81.829 kg)  01/13/16 180 lb 6.4 oz (81.829 kg)     Gen: NAD, alert, cooperative with exam, NCAT EYES: EOMI, no scleral injection or icterus ENT:  TMs pearly gray b/l, OP with mild erythema LYMPH: no cervical LAD CV: NRRR, normal S1/S2, no murmur, distal pulses 2+ b/l Resp: CTABL, no wheezes, normal WOB Abd: +BS, soft, NTND.  Ext: No edema,  warm Neuro: Alert and oriented MSK: normal muscle bulk     Assessment & Plan:    Vanessa Hubbard was seen today for cough and nasal congestion, likely due to acute URI. Symptoms improving.   Diagnoses and all orders for this visit:  Allergic rhinitis, unspecified allergic rhinitis type Flonase, symptomatic care  Acute URI    Follow up plan: Return if symptoms worsen or fail to improve.  Assunta Found, MD [facility] 01/21/2016, 10:49 AM

## 2016-01-21 NOTE — Patient Instructions (Signed)
Fluticasone spray two sprays each nostril daily Cetirizine every day Salt water sprays for nose as needed

## 2016-01-22 ENCOUNTER — Encounter: Payer: Self-pay | Admitting: Pharmacist

## 2016-01-22 ENCOUNTER — Ambulatory Visit (INDEPENDENT_AMBULATORY_CARE_PROVIDER_SITE_OTHER): Payer: Medicare Other | Admitting: Pharmacist

## 2016-01-22 ENCOUNTER — Ambulatory Visit (INDEPENDENT_AMBULATORY_CARE_PROVIDER_SITE_OTHER): Payer: Medicare Other

## 2016-01-22 VITALS — BP 120/70 | HR 75 | Ht 63.0 in | Wt 181.0 lb

## 2016-01-22 DIAGNOSIS — M899 Disorder of bone, unspecified: Secondary | ICD-10-CM

## 2016-01-22 DIAGNOSIS — M8580 Other specified disorders of bone density and structure, unspecified site: Secondary | ICD-10-CM

## 2016-01-22 DIAGNOSIS — Z0000 Encounter for general adult medical examination without abnormal findings: Secondary | ICD-10-CM

## 2016-01-22 NOTE — Patient Instructions (Addendum)
Vanessa Hubbard , Thank you for taking time to come for your Medicare Wellness Visit. I appreciate your ongoing commitment to your health goals. Please review the following plan we discussed and let me know if I can assist you in the future.   These are the goals we discussed:  Increase non-starchy vegetables - carrots, green bean, squash, zucchini, tomatoes, onions, peppers, spinach and other green leafy vegetables, cabbage, lettuce, cucumbers, asparagus, okra (not fried), eggplant limit sugar and processed foods (cakes, cookies, ice cream, crackers and chips) Increase fresh fruit but limit serving sizes 1/2 cup or about the size of tennis or baseball limit red meat to no more than 1-2 times per week (serving size about the size of your palm) Choose whole grains / lean proteins - whole wheat bread, quinoa, whole grain rice (1/2 cup), fish, chicken, [place]  Increase physical activity to at least 150 minutes per week.    This is a list of the screening recommended for you and due dates:  Health Maintenance  Topic Date Due  . DEXA scan (bone density measurement)  Done today  .  Hepatitis C: One time screening is recommended by Center for Disease Control  (CDC) for  adults born from [birth year] through 1967.   02/25/2016 - will get with next labs  . Flu Shot  06/09/2016  . Colon Cancer Screening  08/09/2016  . Mammogram  01/06/2018  . Tetanus Vaccine  06/09/2021  . Shingles Vaccine  Completed  . Pneumonia vaccines  Completed  *Topic was postponed. The date shown is not the original due date.   Fall Prevention in the Home  Falls can cause injuries and can affect people from all age groups. There are many simple things that you can do to make your home safe and to help prevent falls. WHAT CAN I DO ON THE OUTSIDE OF MY HOME?  Regularly repair the edges of walkways and driveways and fix any cracks.  Remove high doorway thresholds.  Trim any shrubbery on the main path into your home.  Use bright  outdoor lighting.  Clear walkways of debris and clutter, including tools and rocks.  Regularly check that handrails are securely fastened and in good repair. Both sides of any steps should have handrails.  Install guardrails along the edges of any raised decks or porches.  Have leaves, snow, and ice cleared regularly.  Use sand or salt on walkways during winter months.  In the garage, clean up any spills right away, including grease or oil spills. WHAT CAN I DO IN THE BATHROOM?  Use night lights.  Install grab bars by the toilet and in the tub and shower. Do not use towel bars as grab bars.  Use non-skid mats or decals on the floor of the tub or shower.  If you need to sit down while you are in the shower, use a plastic, non-slip stool.[place] [employer]  Keep the floor dry. Immediately clean up any water that spills on the floor.  Remove soap buildup in the tub or shower on a regular basis.  Attach bath mats securely with double-sided non-slip rug tape.  Remove throw rugs and other tripping hazards from the floor. WHAT CAN I DO IN THE BEDROOM?  Use night lights.  Make sure that a bedside light is easy to reach.  Do not use oversized bedding that drapes onto the floor.  Have a firm chair that has side arms to use for getting dressed.  Remove throw rugs and other tripping hazards  from the floor. WHAT CAN I DO IN THE KITCHEN?   Clean up any spills right away.  Avoid walking on wet floors.  Place frequently used items in easy-to-reach places.  If you need to reach for something above you, use a sturdy step stool that has a grab bar.  Keep electrical cables out of the way.  Do not use floor polish or wax that makes floors slippery. If you have to use wax, make sure that it is non-skid floor wax.  Remove throw rugs and other tripping hazards from the floor. WHAT CAN I DO IN THE STAIRWAYS?  Do not leave any items on the stairs.  Make sure that there are handrails on both sides of  the stairs. Fix handrails that are broken or loose. Make sure that handrails are as long as the stairways.  Check any carpeting to make sure that it is firmly attached to the stairs. Fix any carpet that is loose or worn.  Avoid having throw rugs at the top or bottom of stairways, or secure the rugs with carpet tape to prevent them from moving.  Make sure that you have a light switch at the top of the stairs and the bottom of the stairs. If you do not have them, have them installed. WHAT ARE SOME OTHER FALL PREVENTION TIPS?  Wear closed-toe shoes that fit well and support your feet. Wear shoes that have rubber soles or low heels.  When you use a stepladder, make sure that it is completely opened and that the sides are firmly locked. Have someone hold the ladder while you are using it. Do not climb a closed stepladder.  Add color or contrast paint or tape to grab bars and handrails in your home. Place contrasting color strips on the first and last steps.  Use mobility aids as needed, such as canes, walkers, scooters, and crutches.  Turn on lights if it is dark. Replace any light bulbs that burn out.  Set up furniture so that there are clear paths. Keep the furniture in the same spot.  Fix any uneven floor surfaces.  Choose a carpet design that does not hide the edge of steps of a stairway.  Be aware of any and all pets.  Review your medicines with your healthcare provider. Some medicines can cause dizziness or changes in blood pressure, which increase your risk of falling. Talk with your health care provider about other ways that you can decrease your risk of falls. This may include working with a physical therapist or trainer to improve your strength, balance, and endurance.   This information is not intended to replace advice given to you by your health care provider. Make sure you discuss any questions you have with your health care provider.   Document Released: 10/16/2002 Document  Revised: 03/12/2015 Document Reviewed: 11/30/2014 Elsevier Interactive Patient Education 2016 [place] Maintenance, Female Adopting a healthy lifestyle and getting preventive care can go a long way to promote health and wellness. Talk with your health care provider about what schedule of regular examinations is right for you. This is a good chance for you to check in with your provider about disease prevention and staying healthy. In between checkups, there are plenty of things you can do on your own. Experts have done a lot of research about which lifestyle changes and preventive measures are most likely to keep you healthy. Ask your health care provider for more information. WEIGHT AND DIET  Eat a healthy  diet  Be sure to include plenty of vegetables, fruits, low-fat dairy products, and lean protein.  Do not eat a lot of foods high in solid fats, added sugars, or salt.  Get regular exercise. This is one of the most important things you can do for your health.  Most adults should exercise for at least 150 minutes each week. The exercise should increase your heart rate and make you sweat (moderate-intensity exercise).  Most adults should also do strengthening exercises at least twice a week. This is in addition to the moderate-intensity exercise.  Maintain a healthy weight  Body mass index (BMI) is a measurement that can be used to identify possible weight problems. It estimates body fat based on height and weight. Your health care provider can help determine your BMI and help you achieve or maintain a healthy weight.  For females 48 years of age and older:   A BMI below 18.5 is considered underweight.  A BMI of 18.5 to 24.9 is normal.  A BMI of 25 to 29.9 is considered overweight.  A BMI of 30 and above is considered obese.  Watch levels of cholesterol and blood lipids  You should start having your blood tested for lipids and cholesterol at 72 years of age, then  have this test every 5 years.  You may need to have your cholesterol levels checked more often if:  Your lipid or cholesterol levels are high.  You are older than 72 years of age.  You are at high risk for heart disease.  CANCER SCREENING   Lung Cancer  Lung cancer screening is recommended for adults 40-89 years old who are at high risk for lung cancer because of a history of smoking.  A yearly low-dose CT scan of the lungs is recommended for people who:  Currently smoke.  Have quit within the past 15 years.  Have at least a 30-pack-year history of smoking. A pack year is smoking an average of one pack of cigarettes a day for 1 year.  Yearly screening should continue until it has been 15 years since you quit.  Yearly screening should stop if you develop a health problem that would prevent you from having lung cancer treatment.  Breast Cancer  Practice breast self-awareness. This means understanding how your breasts normally appear and feel.  It also means doing regular breast self-exams. Let your health care provider know about any changes, no matter how small.  If you are in your 20s or 30s, you should have a clinical breast exam (CBE) by a health care provider every 1-3 years as part of a regular health exam.  If you are 76 or older, have a CBE every year. Also consider having a breast X-ray (mammogram) every year.  If you have a family history of breast cancer, talk to your health care provider about genetic screening.  If you are at high risk for breast cancer, talk to your health care provider about having an MRI and a mammogram every year.  Breast cancer gene (BRCA) assessment is recommended for women who have family members with BRCA-related cancers. BRCA-related cancers include:  Breast.  Ovarian.  Tubal.  Peritoneal cancers.  Results of the assessment will determine the need for genetic counseling and BRCA1 and BRCA2 testing. Cervical Cancer Your health  care provider may recommend that you be screened regularly for cancer of the pelvic organs (ovaries, uterus, and vagina). This screening involves a pelvic examination, including checking for microscopic changes to the surface  of your cervix (Pap test). You may be encouraged to have this screening done every 3 years, beginning at age 32.  For women ages 54-65, health care providers may recommend pelvic exams and Pap testing every 3 years, or they may recommend the Pap and pelvic exam, combined with testing for human papilloma virus (HPV), every 5 years. Some types of HPV increase your risk of cervical cancer. Testing for HPV may also be done on women of any age with unclear Pap test results.  Other health care providers may not recommend any screening for nonpregnant women who are considered low risk for pelvic cancer and who do not have symptoms. Ask your health care provider if a screening pelvic exam is right for you.  If you have had past treatment for cervical cancer or a condition that could lead to cancer, you need Pap tests and screening for cancer for at least 20 years after your treatment. If Pap tests have been discontinued, your risk factors (such as having a new sexual partner) need to be reassessed to determine if screening should resume. Some women have medical problems that increase the chance of getting cervical cancer. In these cases, your health care provider may recommend more frequent screening and Pap tests. Colorectal Cancer  This type of cancer can be detected and often prevented.  Routine colorectal cancer screening usually begins at 72 years of age and continues through 72 years of age.  Your health care provider may recommend screening at an earlier age if you have risk factors for colon cancer.  Your health care provider may also recommend using home test kits to check for hidden blood in the stool.  A small camera at the end of a tube can be used to examine your colon  directly (sigmoidoscopy or colonoscopy). This is done to check for the earliest forms of colorectal cancer.  Routine screening usually begins at age 79.  Direct examination of the colon should be repeated every 5-10 years through 72 years of age. However, you may need to be screened more often if early forms of precancerous polyps or small growths are found. Skin Cancer  Check your skin from head to toe regularly.  Tell your health care provider about any new moles or changes in moles, especially if there is a change in a mole's shape or color.  Also tell your health care provider if you have a mole that is larger than the size of a pencil eraser.  Always use sunscreen. Apply sunscreen liberally and repeatedly throughout the day.  Protect yourself by wearing long sleeves, pants, a wide-brimmed hat, and sunglasses whenever you are outside. HEART DISEASE, DIABETES, AND HIGH BLOOD PRESSURE   High blood pressure causes heart disease and increases the risk of stroke. High blood pressure is more likely to develop in:  People who have blood pressure in the high end of the normal range (130-139/85-89 mm Hg).  People who are overweight or obese.  People who are African American.  If you are 33-51 years of age, have your blood pressure checked every 3-5 years. If you are 12 years of age or older, have your blood pressure checked every year. You should have your blood pressure measured twice--once when you are at a hospital or clinic, and once when you are not at a hospital or clinic. Record the average of the two measurements. To check your blood pressure when you are not at a hospital or clinic, you can use:  An automated  blood pressure machine at a pharmacy.  A home blood pressure monitor.  If you are between 33 years and 39 years old, ask your health care provider if you should take aspirin to prevent strokes.  Have regular diabetes screenings. This involves taking a blood sample to check  your fasting blood sugar level.  If you are at a normal weight and have a low risk for diabetes, have this test once every three years after 72 years of age.  If you are overweight and have a high risk for diabetes, consider being tested at a younger age or more often. PREVENTING INFECTION  Hepatitis B  If you have a higher risk for hepatitis B, you should be screened for this virus. You are considered at high risk for hepatitis B if:  You were born in a country where hepatitis B is common. Ask your health care provider which countries are considered high risk.  Your parents were born in a high-risk country, and you have not been immunized against hepatitis B (hepatitis B vaccine).  You have HIV or AIDS.  You use needles to inject street drugs.  You live with someone who has hepatitis B.  You have had sex with someone who has hepatitis B.  You get hemodialysis treatment.  You take certain medicines for conditions, including cancer, organ transplantation, and autoimmune conditions. Hepatitis C  Blood testing is recommended for:  Everyone born from [birth year] through 1967.  Anyone with known risk factors for hepatitis C. Sexually transmitted infections (STIs)  You should be screened for sexually transmitted infections (STIs) including gonorrhea and chlamydia if:  You are sexually active and are younger than 72 years of age.  You are older than 72 years of age and your health care provider tells you that you are at risk for this type of infection.  Your sexual activity has changed since you were last screened and you are at an increased risk for chlamydia or gonorrhea. Ask your health care provider if you are at risk.  If you do not have HIV, but are at risk, it may be recommended that you take a prescription medicine daily to prevent HIV infection. This is called pre-exposure prophylaxis (PrEP). You are considered at risk if:  You are sexually active and do not regularly use  condoms or know the HIV status of your partner(s).  You take drugs by injection.  You are sexually active with a partner who has HIV. Talk with your health care provider about whether you are at high risk of being infected with HIV. If you choose to begin PrEP, you should first be tested for HIV. You should then be tested every 3 months for as long as you are taking PrEP.  PREGNANCY   If you are premenopausal and you may become pregnant, ask your health care provider about preconception counseling.  If you may become pregnant, take 400 to 800 micrograms (mcg) of folic acid every day.  If you want to prevent pregnancy, talk to your health care provider about birth control (contraception). OSTEOPOROSIS AND MENOPAUSE   Osteoporosis is a disease in which the bones lose minerals and strength with aging. This can result in serious bone fractures. Your risk for osteoporosis can be identified using a bone density scan.  If you are 28 years of age or older, or if you are at risk for osteoporosis and fractures, ask your health care provider if you should be screened.  Ask your health care provider whether you  should take a calcium or vitamin D supplement to lower your risk for osteoporosis.  Menopause may have certain physical symptoms and risks.  Hormone replacement therapy may reduce some of these symptoms and risks. Talk to your health care provider about whether hormone replacement therapy is right for you.  HOME CARE INSTRUCTIONS   Schedule regular health, dental, and eye exams.  Stay current with your immunizations.   Do not use any tobacco products including cigarettes, chewing tobacco, or electronic cigarettes.  If you are pregnant, do not drink alcohol.  If you are breastfeeding, limit how much and how often you drink alcohol.  Limit alcohol intake to no more than 1 drink per day for nonpregnant women. One drink equals 12 ounces of beer, 5 ounces of wine, or 1 ounces of hard  liquor.  Do not use street drugs.  Do not share needles.  Ask your health care provider for help if you need support or information about quitting drugs.  Tell your health care provider if you often feel depressed.  Tell your health care provider if you have ever been abused or do not feel safe at home.   This information is not intended to replace advice given to you by your health care provider. Make sure you discuss any questions you have with your health care provider.   Document Released: 05/11/2011 Document Revised: 11/16/2014 Document Reviewed: 09/27/2013 Elsevier Interactive Patient Education [employer].

## 2016-01-22 NOTE — Progress Notes (Signed)
Patient ID: Vanessa Hubbard, female   DOB: Oct 05, 1946, 70 y.o.   MRN: [id number]    Subjective:   Vanessa Hubbard is a 70 y.o. female who presents for a subsequant Medicare Annual Wellness Visit and osteopenia / DEXA.  [name] is married and lives with her husband in [place], [place].  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 She is alert and cooperative today.  She does not report any health concerns.  Review of Systems  Review of Systems  Constitutional: Negative.   HENT: Negative.   Eyes: Negative.   Respiratory: Negative.   Cardiovascular: Negative.   Gastrointestinal: Negative.   Genitourinary: Negative.   Musculoskeletal: Negative.   Skin: Negative.   Neurological: Negative.   Endo/Heme/Allergies: Negative.   Psychiatric/Behavioral: Negative.     Calcium Assessment Calcium Intake  # of servings/day  Calcium mg  Milk (8 oz) 0  x  300  = 0  Yogurt (4 oz) 0 x  200 = 0  Cheese (1 oz) 1-2 x  200 = 200 to 400mg   Other Calcium sources   250mg   Ca supplement MVI = 400mg    Estimated calcium intake per day 850 to 1050mg    Current Medications (verified) Outpatient Encounter Prescriptions as of 01/22/2016  Medication Sig  . aspirin 81 MG tablet Take 81 mg by mouth daily.  . cetirizine (ZYRTEC) 10 MG tablet Take 1 tablet (10 mg total) by mouth daily as needed. Allergies.  . Cholecalciferol (VITAMIN D3) 2000 UNITS TABS Take 1 tablet by mouth.  . fluticasone (FLONASE) 50 MCG/ACT nasal spray Place 2 sprays into both nostrils daily.  . hydrochlorothiazide (HYDRODIURIL) 25 MG tablet TAKE ONE TABLET BY MOUTH ONCE DAILY  . levothyroxine (SYNTHROID, LEVOTHROID) 100 MCG tablet Take 1 tablet (100 mcg total) by mouth daily.  . Multiple Vitamin (MULTIVITAMIN) tablet Take 1 tablet by mouth daily.  . rosuvastatin (CRESTOR) 10 MG tablet Take 5 mg by mouth daily.   No facility-administered encounter medications on file as of 01/22/2016.    Allergies  (verified) Review of patient's allergies indicates no known allergies.   History: Past Medical History  Diagnosis Date  . Hypertension   . Thyroid disease   . Hyperlipidemia   . Osteopenia   . Broken ankle left  . Fibroids   . History of fractured kneecap    Past Surgical History  Procedure Laterality Date  . Ankle surgery    . Abdominal hysterectomy    . Orif patella  01/22/2012    Procedure: OPEN REDUCTION INTERNAL (ORIF) FIXATION PATELLA;  Surgeon: Meredith Pel, MD;  Location: [facility];  Service: Orthopedics;  Laterality: Left;  . Knee surgery Right   . Cholecystectomy    . Tonsillectomy    . Breast lumpectomy Right     benign   Family History  Problem Relation Age of Onset  . Jaundice Mother   . Cancer Mother     breast  . COPD Father   . Heart disease Father     triple bypass  . Heart failure Father   . Thyroid disease Daughter   . Healthy Son   . Healthy Daughter    Social History   Occupational History  . Not on file.   Social History Main Topics  . Smoking status: Never Smoker   . Smokeless tobacco: Never Used  . Alcohol Use: No  . Drug Use: No  . Sexual Activity: No    Do you  feel safe at home?  Yes  Dietary issues and exercise activities: Current Exercise Habits: Home exercise routine, Type of exercise: walking, Time (Minutes): 15, Frequency (Times/Week): 1, Weekly Exercise (Minutes/Week): 15, Intensity: Mild  Current Dietary habits:  Not following any specific diet at this time.   Objective:    Today's Vitals   01/22/16 1037  BP: 120/70  Pulse: 75  Height: 5\' 3"  (1.6 m)  Weight: 181 lb (82.101 kg)  PainSc: 0-No pain   Body mass index is 32.07 kg/(m^2).   FBG was 121 (11/2015) - no A1c checked  DEXA Results Date of Test T-Score for AP Spine L1-L4 T-Score for Total Left Hip T-Score for Total Right Hip  01/22/2016 0.6 -0.3 0.0  01/17/2014 -0.2 -0.5 -0.1  08/05/2011 -0.3 -0.6 -0.1  03/05/2010 -0.5 -0.4 -0.1   Lowest T-Score =  -2.4 at L1-L4 of spine and neck of left hip (from DEXA in 1999)  Activities of Daily Living In your present state of health, do you have any difficulty performing the following activities: 01/22/2016  Hearing? N  Vision? N  Difficulty concentrating or making decisions? N  Walking or climbing stairs? N  Dressing or bathing? N  Doing errands, shopping? N  Preparing Food and eating ? N  Using the Toilet? N  In the past six months, have you accidently leaked urine? N  Do you have problems with loss of bowel control? N  Managing your Medications? N  Managing your Finances? N  Housekeeping or managing your Housekeeping? N    Are there smokers in your home (other than you)? No   Cardiac Risk Factors include: advanced age (>6men, >19 women);dyslipidemia;hypertension;family history of premature cardiovascular disease;obesity (BMI >30kg/m2);sedentary lifestyle  Depression Screen PHQ 2/9 Scores 01/22/2016 01/21/2016 12/03/2015 11/07/2015  PHQ - 2 Score 0 0 0 0    Fall Risk Fall Risk  01/22/2016 01/21/2016 12/03/2015 11/07/2015 08/27/2015  Falls in the past year? No No No No No  Number falls in past yr: - - - - -  Injury with Fall? - - - - -    Cognitive Function: MMSE - Mini Mental State Exam 01/22/2016  Orientation to time 5  Orientation to Place 5  Registration 3  Attention/ Calculation 4  Recall 3  Language- name 2 objects 2  Language- repeat 1  Language- follow 3 step command 3  Language- read & follow direction 1  Write a sentence 1  Copy design 1  Total score 29    Immunizations and Health Maintenance Immunization History  Administered Date(s) Administered  . Influenza,inj,Quad PF,36+ Mos 09/08/2013, 08/30/2014, 08/27/2015  . Pneumococcal Conjugate-13 08/28/2015  . Pneumococcal Polysaccharide-23 06/10/2011  . Zoster 01/03/2014   There are no preventive care reminders to display for this patient.  Patient Care Team: Chevis Pretty, FNP as PCP - General (Nurse  Practitioner) Laury Axon Manus Gunning, OD (Optometry)  Indicate any recent Medical Services you may have received from other than [facility] providers in the past year (date may be approximate).    Assessment:    Annual Wellness Visit  Osteopenia Elevated BG   Screening Tests Health Maintenance  Topic Date Due  . Hepatitis C Screening  02/25/2016 (Originally 1946-06-21)  . INFLUENZA VACCINE  06/09/2016  . COLONOSCOPY  08/09/2016  . MAMMOGRAM  01/06/2018  . DEXA SCAN  01/21/2018  . TETANUS/TDAP  06/09/2021  . ZOSTAVAX  Completed  . PNA vac Low Risk Adult  Completed        Plan:  During the course of the visit [place] was educated and counseled about the following appropriate screening and preventive services:   Vaccines - currently UTD on all recommended vaccines  Colorectal cancer screening - due colonocopy 08/2016.  Patient is not sure where last was done.  Will request her old chart to see if documented.   Cardiovascular disease screening - last EKG 08/2015  LDL is at goal but last Tg was elevated - currently taking statin  BP was at goal  Elevated BG - discussed limiting high CHO foods and sugar containing foods.  Increase whole grains, lean proteins and non starchy vegetables.   Limiting portion sizes should also help with weight  Diabetes screening - need to recheck FBG and A1c with next labs (due 03/12/16)  Bone Denisty / Osteoporosis Screening - done today; Recommend increase calcium to 1200mg  daily through diet or supplementation  Increase physical activity and weight bearing exercise - goal is 150 minutes per week. Gave chair exercise handout to do when cold or rainy outside.  Mammogram - UTD  PAP - no longer required  Glaucoma screening / Eye Exam - UTD; done 11/2015  Nutrition counseling - see above (discussed limiting CHo and increasing calcium)  Also discussed BMI and decreasing weight  Advanced Directives - information given    Patient Instructions (the written  plan) were given to the patient.   Cherre Robins, [facility]   01/22/2016

## 2016-02-10 NOTE — Progress Notes (Signed)
Patient ID: Vanessa Hubbard, female   DOB: [birth year], 70 y.o.   MRN: [id number]  02/10/2016 Received patient's paper chart.  Reviewed last colonoscopy was performed at [facility] by Dr Watt Climes.  Added him to patient's list of consulting physicians and patient notified.

## 2016-03-12 ENCOUNTER — Ambulatory Visit (INDEPENDENT_AMBULATORY_CARE_PROVIDER_SITE_OTHER): Payer: Medicare Other | Admitting: Nurse Practitioner

## 2016-03-12 ENCOUNTER — Encounter: Payer: Self-pay | Admitting: Nurse Practitioner

## 2016-03-12 VITALS — BP 123/64 | HR 74 | Temp 97.3°F | Ht 63.0 in | Wt 183.2 lb

## 2016-03-12 DIAGNOSIS — Z1159 Encounter for screening for other viral diseases: Secondary | ICD-10-CM | POA: Diagnosis not present

## 2016-03-12 DIAGNOSIS — Z1212 Encounter for screening for malignant neoplasm of rectum: Secondary | ICD-10-CM | POA: Diagnosis not present

## 2016-03-12 DIAGNOSIS — Z6832 Body mass index (BMI) 32.0-32.9, adult: Secondary | ICD-10-CM

## 2016-03-12 DIAGNOSIS — M8580 Other specified disorders of bone density and structure, unspecified site: Secondary | ICD-10-CM | POA: Diagnosis not present

## 2016-03-12 DIAGNOSIS — E785 Hyperlipidemia, unspecified: Secondary | ICD-10-CM | POA: Diagnosis not present

## 2016-03-12 DIAGNOSIS — I10 Essential (primary) hypertension: Secondary | ICD-10-CM | POA: Diagnosis not present

## 2016-03-12 DIAGNOSIS — E039 Hypothyroidism, unspecified: Secondary | ICD-10-CM | POA: Diagnosis not present

## 2016-03-12 MED ORDER — HYDROCHLOROTHIAZIDE 25 MG PO TABS: ORAL_TABLET | ORAL | Status: DC

## 2016-03-12 MED ORDER — ROSUVASTATIN CALCIUM 10 MG PO TABS: 5.0000 mg | ORAL_TABLET | Freq: Every day | ORAL | Status: DC

## 2016-03-12 MED ORDER — LEVOTHYROXINE SODIUM 100 MCG PO TABS: 100.0000 ug | ORAL_TABLET | Freq: Every day | ORAL | Status: DC

## 2016-03-12 NOTE — Progress Notes (Signed)
Subjective:    Patient ID: Vanessa Hubbard, female    DOB: 05/31/1946, 69 y.o.   MRN: [id number]  Patient here today for follow up of chronic medical problems.  Outpatient Encounter Prescriptions as of 03/12/2016  Medication Sig  . aspirin 81 MG tablet Take 81 mg by mouth daily.  . cetirizine (ZYRTEC) 10 MG tablet Take 1 tablet (10 mg total) by mouth daily as needed. Allergies.  . Cholecalciferol (VITAMIN D3) 2000 UNITS TABS Take 1 tablet by mouth.  . hydrochlorothiazide (HYDRODIURIL) 25 MG tablet TAKE ONE TABLET BY MOUTH ONCE DAILY  . levothyroxine (SYNTHROID, LEVOTHROID) 100 MCG tablet Take 1 tablet (100 mcg total) by mouth daily.  . Multiple Vitamin (MULTIVITAMIN) tablet Take 1 tablet by mouth daily.  . rosuvastatin (CRESTOR) 10 MG tablet Take 5 mg by mouth daily.  . fluticasone (FLONASE) 50 MCG/ACT nasal spray Place 2 sprays into both nostrils daily. (Patient not taking: Reported on 03/12/2016)   No facility-administered encounter medications on file as of 03/12/2016.    Hyperlipidemia This is a chronic problem. The current episode started more than 1 year ago. The problem is controlled. Recent lipid tests were reviewed and are variable. Pertinent negatives include no myalgias or shortness of breath. Current antihyperlipidemic treatment includes statins. The current treatment provides moderate improvement of lipids. Compliance problems include adherence to diet and adherence to exercise.  Risk factors for coronary artery disease include diabetes mellitus, dyslipidemia, hypertension and obesity.  Hypertension This is a chronic problem. The problem is controlled. Pertinent negatives include no palpitations or shortness of breath. Agents associated with hypertension include steroids. Risk factors for coronary artery disease include diabetes mellitus, dyslipidemia and stress. Past treatments include diuretics. The current treatment provides moderate improvement. Compliance problems include diet and  exercise.  Hypertensive end-organ damage includes a thyroid problem.  Thyroid Problem Visit type: hypothyroidism. Patient reports no constipation or palpitations. The symptoms have been stable. The treatment provided mild relief. Her past medical history is significant for hyperlipidemia.  osteopenia Currently taking Vit D. No weight bearing exercises  * had what she thought was a bug bite on left hand- started out looking like a blister but never popped and drained- now it is just a hard nodule- has been there 2 months- unchanged in the last month.  Review of Systems  Constitutional: Negative.   HENT: Negative.   Respiratory: Negative for shortness of breath.   Cardiovascular: Negative for palpitations.  Gastrointestinal: Negative for constipation.  Genitourinary: Negative.   Musculoskeletal: Negative for myalgias.  Neurological: Negative.   Psychiatric/Behavioral: Negative.   All other systems reviewed and are negative.      Objective:   Physical Exam  Constitutional: She is oriented to person, place, and time. She appears well-developed and well-nourished.  HENT:  Nose: Nose normal.  Mouth/Throat: Oropharynx is clear and moist.  Eyes: EOM are normal.  Neck: Trachea normal, normal range of motion and full passive range of motion without pain. Neck supple. No JVD present. Carotid bruit is not present. No thyromegaly present.  Cardiovascular: Normal rate, regular rhythm, normal heart sounds and intact distal pulses.  Exam reveals no gallop and no friction rub.   No murmur heard. Pulmonary/Chest: Effort normal and breath sounds normal.  Abdominal: Soft. Bowel sounds are normal. She exhibits no distension and no mass. There is no tenderness.  Musculoskeletal: Normal range of motion.  Lymphadenopathy:    She has no cervical adenopathy.  Neurological: She is alert and oriented to person, place, and  time. She has normal reflexes.  Skin: Skin is warm and dry.  2cm maculopapular  scaley whitish colored lesion on left hand  Psychiatric: She has a normal mood and affect. Her behavior is normal. Judgment and thought content normal.   BP 123/64 mmHg  Pulse 74  Temp(Src) 97.3 F (36.3 C) (Oral)  Ht '5\' 3"'$  (1.6 m)  Wt 183 lb 3.2 oz (83.099 kg)  BMI 32.46 kg/m2      Assessment & Plan:  1. Hypothyroidism, unspecified hypothyroidism type - levothyroxine (SYNTHROID, LEVOTHROID) 100 MCG tablet; Take 1 tablet (100 mcg total) by mouth daily.  Dispense: 90 tablet; Refill: 1  2. Hyperlipidemia with target LDL less than 100 Low fat diet - rosuvastatin (CRESTOR) 10 MG tablet; Take 0.5 tablets (5 mg total) by mouth daily.  Dispense: 90 tablet; Refill: 1 - Lipid panel - rosuvastatin (CRESTOR) 10 MG tablet; Take 0.5 tablets (5 mg total) by mouth daily.  Dispense: 90 tablet; Refill: 1  3. Essential hypertension Do not add saltt o diet - hydrochlorothiazide (HYDRODIURIL) 25 MG tablet; TAKE ONE TABLET BY MOUTH ONCE DAILY  Dispense: 90 tablet; Refill: 1 - CMP14+EGFR  4. BMI 32.0-32.9,adult Discussed diet and exercise for person with BMI >25 Will recheck weight in 3-6 months  5. Osteopenia Weight bearing exercises  6. Screening for malignant neoplasm of the rectum - Fecal occult blood, imunochemical; Future  7. Need for hepatitis C screening test - Hepatitis C antibody    Labs pending Health maintenance reviewed Diet and exercise encouraged Continue all meds Follow up  In 3 month   Gregory, FNP

## 2016-03-12 NOTE — Patient Instructions (Signed)
Health Maintenance, Female Adopting a healthy lifestyle and getting preventive care can go a long way to promote health and wellness. Talk with your health care provider about what schedule of regular examinations is right for you. This is a good chance for you to check in with your provider about disease prevention and staying healthy. In between checkups, there are plenty of things you can do on your own. Experts have done a lot of research about which lifestyle changes and preventive measures are most likely to keep you healthy. Ask your health care provider for more information. WEIGHT AND DIET  Eat a healthy diet  Be sure to include plenty of vegetables, fruits, low-fat dairy products, and lean protein.  Do not eat a lot of foods high in solid fats, added sugars, or salt.  Get regular exercise. This is one of the most important things you can do for your health.  Most adults should exercise for at least 150 minutes each week. The exercise should increase your heart rate and make you sweat (moderate-intensity exercise).  Most adults should also do strengthening exercises at least twice a week. This is in addition to the moderate-intensity exercise.  Maintain a healthy weight  Body mass index (BMI) is a measurement that can be used to identify possible weight problems. It estimates body fat based on height and weight. Your health care provider can help determine your BMI and help you achieve or maintain a healthy weight.  For females 20 years of age and older:   A BMI below 18.5 is considered underweight.  A BMI of 18.5 to 24.9 is normal.  A BMI of 25 to 29.9 is considered overweight.  A BMI of 30 and above is considered obese.  Watch levels of cholesterol and blood lipids  You should start having your blood tested for lipids and cholesterol at 72 years of age, then have this test every 5 years.  You may need to have your cholesterol levels checked more often if:  Your lipid  or cholesterol levels are high.  You are older than 72 years of age.  You are at high risk for heart disease.  CANCER SCREENING   Lung Cancer  Lung cancer screening is recommended for adults 55-80 years old who are at high risk for lung cancer because of a history of smoking.  A yearly low-dose CT scan of the lungs is recommended for people who:  Currently smoke.  Have quit within the past 15 years.  Have at least a 30-pack-year history of smoking. A pack year is smoking an average of one pack of cigarettes a day for 1 year.  Yearly screening should continue until it has been 15 years since you quit.  Yearly screening should stop if you develop a health problem that would prevent you from having lung cancer treatment.  Breast Cancer  Practice breast self-awareness. This means understanding how your breasts normally appear and feel.  It also means doing regular breast self-exams. Let your health care provider know about any changes, no matter how small.  If you are in your 20s or 30s, you should have a clinical breast exam (CBE) by a health care provider every 1-3 years as part of a regular health exam.  If you are 40 or older, have a CBE every year. Also consider having a breast X-ray (mammogram) every year.  If you have a family history of breast cancer, talk to your health care provider about genetic screening.  If you   are at high risk for breast cancer, talk to your health care provider about having an MRI and a mammogram every year.  Breast cancer gene (BRCA) assessment is recommended for women who have family members with BRCA-related cancers. BRCA-related cancers include:  Breast.  Ovarian.  Tubal.  Peritoneal cancers.  Results of the assessment will determine the need for genetic counseling and BRCA1 and BRCA2 testing. Cervical Cancer Your health care provider may recommend that you be screened regularly for cancer of the pelvic organs (ovaries, uterus, and  vagina). This screening involves a pelvic examination, including checking for microscopic changes to the surface of your cervix (Pap test). You may be encouraged to have this screening done every 3 years, beginning at age 21.  For women ages 30-65, health care providers may recommend pelvic exams and Pap testing every 3 years, or they may recommend the Pap and pelvic exam, combined with testing for human papilloma virus (HPV), every 5 years. Some types of HPV increase your risk of cervical cancer. Testing for HPV may also be done on women of any age with unclear Pap test results.  Other health care providers may not recommend any screening for nonpregnant women who are considered low risk for pelvic cancer and who do not have symptoms. Ask your health care provider if a screening pelvic exam is right for you.  If you have had past treatment for cervical cancer or a condition that could lead to cancer, you need Pap tests and screening for cancer for at least 20 years after your treatment. If Pap tests have been discontinued, your risk factors (such as having a new sexual partner) need to be reassessed to determine if screening should resume. Some women have medical problems that increase the chance of getting cervical cancer. In these cases, your health care provider may recommend more frequent screening and Pap tests. Colorectal Cancer  This type of cancer can be detected and often prevented.  Routine colorectal cancer screening usually begins at 72 years of age and continues through 72 years of age.  Your health care provider may recommend screening at an earlier age if you have risk factors for colon cancer.  Your health care provider may also recommend using home test kits to check for hidden blood in the stool.  A small camera at the end of a tube can be used to examine your colon directly (sigmoidoscopy or colonoscopy). This is done to check for the earliest forms of colorectal  cancer.  Routine screening usually begins at age 50.  Direct examination of the colon should be repeated every 5-10 years through 72 years of age. However, you may need to be screened more often if early forms of precancerous polyps or small growths are found. Skin Cancer  Check your skin from head to toe regularly.  Tell your health care provider about any new moles or changes in moles, especially if there is a change in a mole's shape or color.  Also tell your health care provider if you have a mole that is larger than the size of a pencil eraser.  Always use sunscreen. Apply sunscreen liberally and repeatedly throughout the day.  Protect yourself by wearing long sleeves, pants, a wide-brimmed hat, and sunglasses whenever you are outside. HEART DISEASE, DIABETES, AND HIGH BLOOD PRESSURE   High blood pressure causes heart disease and increases the risk of stroke. High blood pressure is more likely to develop in:  People who have blood pressure in the high end   of the normal range (130-139/85-89 mm Hg).  People who are overweight or obese.  People who are African American.  If you are 38-23 years of age, have your blood pressure checked every 3-5 years. If you are 61 years of age or older, have your blood pressure checked every year. You should have your blood pressure measured twice--once when you are at a hospital or clinic, and once when you are not at a hospital or clinic. Record the average of the two measurements. To check your blood pressure when you are not at a hospital or clinic, you can use:  An automated blood pressure machine at a pharmacy.  A home blood pressure monitor.  If you are between 45 years and 39 years old, ask your health care provider if you should take aspirin to prevent strokes.  Have regular diabetes screenings. This involves taking a blood sample to check your fasting blood sugar level.  If you are at a normal weight and have a low risk for diabetes,  have this test once every three years after 72 years of age.  If you are overweight and have a high risk for diabetes, consider being tested at a younger age or more often. PREVENTING INFECTION  Hepatitis B  If you have a higher risk for hepatitis B, you should be screened for this virus. You are considered at high risk for hepatitis B if:  You were born in a country where hepatitis B is common. Ask your health care provider which countries are considered high risk.  Your parents were born in a high-risk country, and you have not been immunized against hepatitis B (hepatitis B vaccine).  You have HIV or AIDS.  You use needles to inject street drugs.  You live with someone who has hepatitis B.  You have had sex with someone who has hepatitis B.  You get hemodialysis treatment.  You take certain medicines for conditions, including cancer, organ transplantation, and autoimmune conditions. Hepatitis C  Blood testing is recommended for:  Everyone born from [birth year] through 1967.  Anyone with known risk factors for hepatitis C. Sexually transmitted infections (STIs)  You should be screened for sexually transmitted infections (STIs) including gonorrhea and chlamydia if:  You are sexually active and are younger than 72 years of age.  You are older than 72 years of age and your health care provider tells you that you are at risk for this type of infection.  Your sexual activity has changed since you were last screened and you are at an increased risk for chlamydia or gonorrhea. Ask your health care provider if you are at risk.  If you do not have HIV, but are at risk, it may be recommended that you take a prescription medicine daily to prevent HIV infection. This is called pre-exposure prophylaxis (PrEP). You are considered at risk if:  You are sexually active and do not regularly use condoms or know the HIV status of your partner(s).  You take drugs by injection.  You are sexually  active with a partner who has HIV. Talk with your health care provider about whether you are at high risk of being infected with HIV. If you choose to begin PrEP, you should first be tested for HIV. You should then be tested every 3 months for as long as you are taking PrEP.  PREGNANCY   If you are premenopausal and you may become pregnant, ask your health care provider about preconception counseling.  If you may  become pregnant, take 400 to 800 micrograms (mcg) of folic acid every day.  If you want to prevent pregnancy, talk to your health care provider about birth control (contraception). OSTEOPOROSIS AND MENOPAUSE   Osteoporosis is a disease in which the bones lose minerals and strength with aging. This can result in serious bone fractures. Your risk for osteoporosis can be identified using a bone density scan.  If you are 61 years of age or older, or if you are at risk for osteoporosis and fractures, ask your health care provider if you should be screened.  Ask your health care provider whether you should take a calcium or vitamin D supplement to lower your risk for osteoporosis.  Menopause may have certain physical symptoms and risks.  Hormone replacement therapy may reduce some of these symptoms and risks. Talk to your health care provider about whether hormone replacement therapy is right for you.  HOME CARE INSTRUCTIONS   Schedule regular health, dental, and eye exams.  Stay current with your immunizations.   Do not use any tobacco products including cigarettes, chewing tobacco, or electronic cigarettes.  If you are pregnant, do not drink alcohol.  If you are breastfeeding, limit how much and how often you drink alcohol.  Limit alcohol intake to no more than 1 drink per day for nonpregnant women. One drink equals 12 ounces of beer, 5 ounces of wine, or 1 ounces of hard liquor.  Do not use street drugs.  Do not share needles.  Ask your health care provider for help if  you need support or information about quitting drugs.  Tell your health care provider if you often feel depressed.  Tell your health care provider if you have ever been abused or do not feel safe at home.   This information is not intended to replace advice given to you by your health care provider. Make sure you discuss any questions you have with your health care provider.   Document Released: 05/11/2011 Document Revised: 11/16/2014 Document Reviewed: 09/27/2013 Elsevier Interactive Patient Education [employer].

## 2016-03-13 LAB — CMP14+EGFR
A/G RATIO: 2.4 — AB (ref 1.2–2.2)
ALT: 19 IU/L (ref 0–32)
AST: 20 IU/L (ref 0–40)
Albumin: 4.5 g/dL (ref 3.5–4.8)
Alkaline Phosphatase: 77 IU/L (ref 39–117)
BILIRUBIN TOTAL: 1.8 mg/dL — AB (ref 0.0–1.2)
BUN/Creatinine Ratio: 19 (ref 12–28)
BUN: 14 mg/dL (ref 8–27)
CALCIUM: 9.5 mg/dL (ref 8.7–10.3)
CO2: 26 mmol/L (ref 18–29)
CREATININE: 0.73 mg/dL (ref 0.57–1.00)
Chloride: 99 mmol/L (ref 96–106)
GFR calc Af Amer: 95 mL/min/{1.73_m2} (ref >59)
GFR, EST NON AFRICAN AMERICAN: 83 mL/min/{1.73_m2} (ref >59)
GLOBULIN, TOTAL: 1.9 g/dL (ref 1.5–4.5)
Glucose: 142 mg/dL — ABNORMAL HIGH (ref 65–99)
Potassium: 4.2 mmol/L (ref 3.5–5.2)
Sodium: 142 mmol/L (ref 134–144)
TOTAL PROTEIN: 6.4 g/dL (ref 6.0–8.5)

## 2016-03-13 LAB — LIPID PANEL
CHOL/HDL RATIO: 1.9 ratio (ref 0.0–4.4)
Cholesterol, Total: 130 mg/dL (ref 100–199)
HDL: 67 mg/dL (ref >39)
LDL CALC: 32 mg/dL (ref 0–99)
TRIGLYCERIDES: 157 mg/dL — AB (ref 0–149)
VLDL CHOLESTEROL CAL: 31 mg/dL (ref 5–40)

## 2016-03-13 LAB — HEPATITIS C ANTIBODY: Hep C Virus Ab: <0.1 s/co ratio (ref 0.0–0.9)

## 2016-04-21 ENCOUNTER — Other Ambulatory Visit: Payer: Medicare Other

## 2016-04-21 DIAGNOSIS — Z1212 Encounter for screening for malignant neoplasm of rectum: Secondary | ICD-10-CM

## 2016-04-24 LAB — FECAL OCCULT BLOOD, IMMUNOCHEMICAL: FECAL OCCULT BLD: NEGATIVE

## 2016-06-15 ENCOUNTER — Ambulatory Visit: Payer: Medicare Other | Admitting: Nurse Practitioner

## 2016-06-15 ENCOUNTER — Encounter: Payer: Self-pay | Admitting: Pediatrics

## 2016-06-15 ENCOUNTER — Ambulatory Visit (INDEPENDENT_AMBULATORY_CARE_PROVIDER_SITE_OTHER): Payer: Medicare Other | Admitting: Pediatrics

## 2016-06-15 VITALS — BP 125/66 | HR 79 | Temp 97.0°F | Ht 63.0 in | Wt 184.4 lb

## 2016-06-15 DIAGNOSIS — E039 Hypothyroidism, unspecified: Secondary | ICD-10-CM | POA: Diagnosis not present

## 2016-06-15 DIAGNOSIS — R739 Hyperglycemia, unspecified: Secondary | ICD-10-CM | POA: Diagnosis not present

## 2016-06-15 DIAGNOSIS — I10 Essential (primary) hypertension: Secondary | ICD-10-CM

## 2016-06-15 DIAGNOSIS — E785 Hyperlipidemia, unspecified: Secondary | ICD-10-CM | POA: Diagnosis not present

## 2016-06-15 DIAGNOSIS — Z6832 Body mass index (BMI) 32.0-32.9, adult: Secondary | ICD-10-CM | POA: Diagnosis not present

## 2016-06-15 LAB — BAYER DCA HB A1C WAIVED: HB A1C (BAYER DCA - WAIVED): 6.9 % (ref <7.0)

## 2016-06-15 NOTE — Patient Instructions (Addendum)
Let me know if episode of discharge happens again I will want to get labs Checking your average blood sugar levels today Keep up with walking regularly

## 2016-06-15 NOTE — Progress Notes (Signed)
Subjective:    Patient ID: Vanessa Hubbard, female    DOB: Nov 20, 1945, 70 y.o.   MRN: [id number]  CC: Follow-up (3 month) multiple med problems  HPI: Vanessa Hubbard is a 70 y.o. female presenting for Follow-up (3 month)  Has had discharge in the center of her shirt when waking up twice over the last few months Didn't notice any nipple discharge but not enitrely sure where discharge was coming from Has several SKs in area, sometimes that get rubbed off and irritated Has had pimples in area at times with discharge Was several small punctate spots on shirt, some down to sheets, several spots No lumps, bumps in breasts Last mammogram 5 months ago, was normal Mother w/ h/o breast ca  Appetite normal Trying to walk more Normal urination Drinking plenty of water Eating lots of fruits and veg, trying to cook at home  No headaches, no chest pain or SOB Spot on thumb itches occasionally, present for past few weeks H/o melanoma  Depression screen [facility] 2/9 06/15/2016 03/12/2016 01/22/2016 01/21/2016 12/03/2015  Decreased Interest 0 0 0 0 0  Down, Depressed, Hopeless 0 0 0 0 0  PHQ - 2 Score 0 0 0 0 0     Relevant past medical, surgical, family and social history reviewed and updated. Interim medical history since our last visit reviewed. Allergies and medications reviewed and updated.  History  Smoking Status  . Never Smoker  Smokeless Tobacco  . Never Used    ROS: Per HPI      Objective:    BP 125/66 (BP Location: Right Arm, Patient Position: Sitting, Cuff Size: Large)   Pulse 79   Temp 97 F (36.1 C) (Oral)   Ht 5\' 3"  (1.6 m)   Wt 184 lb 6.4 oz (83.6 kg)   BMI 32.66 kg/m   Wt Readings from Last 3 Encounters:  06/15/16 184 lb 6.4 oz (83.6 kg)  03/12/16 183 lb 3.2 oz (83.1 kg)  01/22/16 181 lb (82.1 kg)     Gen: NAD, alert, cooperative with exam, NCAT EYES: EOMI, no scleral injection or icterus LYMPH: no cervical LAD Breast: normal exam b/l, normal appearing  nipples, no lumps or masses, no axillary LAD CV: NRRR, normal S1/S2, no murmur, distal pulses 2+ b/l Resp: CTABL, no wheezes, normal WOB Ext: No edema, warm Neuro: Alert and oriented, strength equal b/l UE and LE, coordination grossly normal MSK: normal muscle bulk Skin: 47mm red-pink papule dorsum of thumb metacarpal     Assessment & Plan:    [name] was seen today for follow-up multiple med problems.  Diagnoses and all orders for this visit:  Essential hypertension On HCTZ Well controlled Cont med Labs last checked three months ago, normal  Hypothyroidism, unspecified hypothyroidism type Continue current medicine, TSH normal last lab check within the year  BMI 32.0-32.9,adult Discussed lifestyle changes Increase physical activity  Hyperglycemia Several elevated BGL readings on last few lab draws, check HgA1c -     Bayer DCA Hb A1c Waived  Skin lesion Itching at times Has scraped it off herself a couple of times Offered to freeze it off given possibility it is AK vs small basal cell Pt declined, wants to see if heals on its own next few weeks  Discharge Not clear where from, if it is nipple discharge or from other skin area Many SKs, some irritated, around bra line Screening mammogram nl 01/07/16 Normal exam today If happens again will get labs for galactorrhea work  up, diagnostic mammogram  HLD Well controlled Continue crestor  Follow up plan: Return in about 3 months (around 09/15/2016) for med follow up.  Assunta Found, MD [facility] 06/15/2016, 9:03 AM

## 2016-06-16 ENCOUNTER — Encounter: Payer: Self-pay | Admitting: Pediatrics

## 2016-06-16 ENCOUNTER — Other Ambulatory Visit: Payer: Self-pay | Admitting: Pediatrics

## 2016-06-16 DIAGNOSIS — E119 Type 2 diabetes mellitus without complications: Secondary | ICD-10-CM

## 2016-06-16 MED ORDER — METFORMIN HCL 500 MG PO TABS: 500.0000 mg | ORAL_TABLET | Freq: Two times a day (BID) | ORAL | 3 refills | Status: DC

## 2016-06-18 ENCOUNTER — Ambulatory Visit (INDEPENDENT_AMBULATORY_CARE_PROVIDER_SITE_OTHER): Payer: Medicare Other | Admitting: Pharmacist

## 2016-06-18 ENCOUNTER — Encounter: Payer: Self-pay | Admitting: Pharmacist

## 2016-06-18 VITALS — BP 134/78 | HR 70 | Ht 63.0 in | Wt 183.0 lb

## 2016-06-18 DIAGNOSIS — E119 Type 2 diabetes mellitus without complications: Secondary | ICD-10-CM

## 2016-06-18 MED ORDER — GLUCOSE BLOOD VI STRP: ORAL_STRIP | 2 refills | Status: DC

## 2016-06-18 MED ORDER — BD LANCET ULTRAFINE 33G MISC: 1 refills | Status: DC

## 2016-06-18 NOTE — Patient Instructions (Signed)
Diabetes and Standards of Medical Care   Diabetes is complicated. You may find that your diabetes team includes a dietitian, nurse, diabetes educator, eye doctor, and more. To help everyone know what is going on and to help you get the care you deserve, the following schedule of care was developed to help keep you on track. Below are the tests, exams, vaccines, medicines, education, and plans you will need.  Blood Glucose Goals Prior to meals = 80 - 130 Within 2 hours of the start of a meal = less than 180  HbA1c test (goal is less than 6.5% - your last value was 6.9%) This test shows how well you have controlled your glucose over the past 2 to 3 months. It is used to see if your diabetes management plan needs to be adjusted.   It is performed at least 2 times a year if you are meeting treatment goals.  It is performed 4 times a year if therapy has changed or if you are not meeting treatment goals.  Blood pressure test  This test is performed at every routine medical visit. The goal is less than 140/90 mmHg for most people, but 130/80 mmHg in some cases. Ask your health care provider about your goal.  Dental exam  Follow up with the dentist regularly.  Eye exam  If you are diagnosed with type 1 diabetes as a child, get an exam upon reaching the age of 107 years or older and have had diabetes for 3 to 5 years. Yearly eye exams are recommended after that initial eye exam.  If you are diagnosed with type 1 diabetes as an adult, get an exam within 5 years of diagnosis and then yearly.  If you are diagnosed with type 2 diabetes, get an exam as soon as possible after the diagnosis and then yearly.  Foot care exam  Visual foot exams are performed at every routine medical visit. The exams check for cuts, injuries, or other problems with the feet.  A comprehensive foot exam should be done yearly. This includes visual inspection as well as assessing foot pulses and testing for loss of  sensation.  Check your feet nightly for cuts, injuries, or other problems with your feet. Tell your health care provider if anything is not healing.  Kidney function test (urine microalbumin)  This test is performed once a year.  Type 1 diabetes: The first test is performed 5 years after diagnosis.  Type 2 diabetes: The first test is performed at the time of diagnosis.  A serum creatinine and estimated glomerular filtration rate (eGFR) test is done once a year to assess the level of chronic kidney disease (CKD), if present.  Lipid profile (cholesterol, HDL, LDL, triglycerides)  Performed every 5 years for most people.  The goal for LDL is less than 100 mg/dL. If you are at high risk, the goal is less than 70 mg/dL.  The goal for HDL is 40 mg/dL to 50 mg/dL for men and 50 mg/dL to 60 mg/dL for women. An HDL cholesterol of 60 mg/dL or higher gives some protection against heart disease.  The goal for triglycerides is less than 150 mg/dL.  Influenza vaccine, pneumococcal vaccine, and hepatitis B vaccine  The influenza vaccine is recommended yearly.  The pneumococcal vaccine is generally given once in a lifetime. However, there are some instances when another vaccination is recommended. Check with your health care provider.  The hepatitis B vaccine is also recommended for adults with diabetes.  Diabetes self-management education  Education is recommended at diagnosis and ongoing as needed.  Treatment plan  Your treatment plan is reviewed at every medical visit.  Document Released: 08/23/2009 Document Revised: 06/28/2013 Document Reviewed: 03/28/2013 [employer] Patient Information 2014 [employer].   

## 2016-06-18 NOTE — Progress Notes (Signed)
  Subjective:    Vanessa Hubbard is a 70 y.o. female who presents for an initial evaluation of Type 2 diabetes mellitus.   She has a history of hyperglycemia but was just diagnosed with type 2 DM last week after FBG of 142 (03/2016) and then A1c = 6.9% (06/15/2016). Patient does have a family history of DM - father  Known diabetic complications: none Cardiovascular risk factors: advanced age (older than 13 for men, 51 for women), diabetes mellitus, dyslipidemia, obesity (BMI >= 30 kg/m2) and sedentary lifestyle Current diabetic medications include metformin 500mg  bid.   Eye exam current (within one year): patient has eye exxam 11/2015 but this was prior to diagnosis of DM Weight trend: stable Prior visit with dietician: no Current diet: in general, an "unhealthy" diet.  Patient reports that she loves sweets.  Have been eating doughnuts and ice cream regularly.  She also drinks sweet tea daily. Current exercise: none  Current monitoring regimen: none but she does have a Contour Next glucometer at home. Home blood sugar records: none Any episodes of hypoglycemia? no  Is She on ACE inhibitor or angiotensin II receptor blocker?  No       The following portions of the patient's history were reviewed and updated as appropriate: allergies, current medications, past family history, past medical history, past social history, past surgical history and problem list.   Objective:    BP 134/78   Pulse 70   Ht 5\' 3"  (1.6 m)   Wt 183 lb (83 kg)   BMI 32.42 kg/m   A1c = 6.9% (06/15/2016)  Lab Review Glucose (mg/dL)  Date Value  03/12/2016 142 (H)  12/03/2015 121 (H)  08/27/2015 111 (H)   Glucose, Bld (mg/dL)  Date Value  03/03/2013 87  01/22/2012 120 (H)   CO2 (mmol/L)  Date Value  03/12/2016 26  12/03/2015 24  08/27/2015 29   BUN (mg/dL)  Date Value  03/12/2016 14  12/03/2015 16  08/27/2015 14   Creat (mg/dL)  Date Value  03/03/2013 0.99   Creatinine, Ser (mg/dL)   Date Value  03/12/2016 0.73  12/03/2015 0.81  08/27/2015 0.87    Assessment:    Diabetes Mellitus type II  - newly diagnosed.   Plan:    1.  Rx changes: none 2.  Education: Reviewed 'ABCs' of diabetes management (respective goals in parentheses):  A1C (<7), blood pressure (<130/80), and cholesterol (LDL <100). 3. Discussed HBG testing and Rx send for test strips and lancet for the meter patient already has.  Instructed to check BG up to once a day.  Discussed BG goals (FBG 80 to 130 and post prandial less than 180) 4. CHO counting diet discussed.  Reviewed CHO amount in various foods and how to read nutrition labels.  Discussed recommended serving sizes.  5.  Recommended increase physical activity - goal is 150 minutes per week.  Discussed finding a set time to exercise and to try to exercise in early am especially during summer months to avoid heat.  6. Follow up: 3 months

## 2016-06-19 LAB — MICROALBUMIN / CREATININE URINE RATIO
CREATININE, UR: 34.5 mg/dL
MICROALB/CREAT RATIO: <8.7 mg/g creat (ref 0.0–30.0)
Microalbumin, Urine: <3 ug/mL

## 2016-06-22 ENCOUNTER — Encounter: Payer: Self-pay | Admitting: Pharmacist

## 2016-06-23 ENCOUNTER — Ambulatory Visit: Payer: Medicare Other | Admitting: Pharmacist

## 2016-09-02 ENCOUNTER — Encounter: Payer: Self-pay | Admitting: Pediatrics

## 2016-09-02 ENCOUNTER — Ambulatory Visit (INDEPENDENT_AMBULATORY_CARE_PROVIDER_SITE_OTHER): Payer: Medicare Other | Admitting: Pediatrics

## 2016-09-02 VITALS — BP 122/67 | HR 80 | Temp 97.8°F | Ht 63.0 in | Wt 173.8 lb

## 2016-09-02 DIAGNOSIS — N649 Disorder of breast, unspecified: Secondary | ICD-10-CM | POA: Diagnosis not present

## 2016-09-02 DIAGNOSIS — Z23 Encounter for immunization: Secondary | ICD-10-CM | POA: Diagnosis not present

## 2016-09-02 DIAGNOSIS — N644 Mastodynia: Secondary | ICD-10-CM | POA: Diagnosis not present

## 2016-09-02 DIAGNOSIS — N61 Mastitis without abscess: Secondary | ICD-10-CM

## 2016-09-02 MED ORDER — CEPHALEXIN 500 MG PO CAPS
500.0000 mg | ORAL_CAPSULE | Freq: Two times a day (BID) | ORAL | 0 refills | Status: DC
Start: 2016-09-02 — End: 2016-09-16

## 2016-09-02 NOTE — Progress Notes (Signed)
  Subjective:   Patient ID: Vanessa Hubbard, female    DOB: 04-Sep-1946, 70 y.o.   MRN: [id number] CC: Breast Pain (Right)  HPI: Vanessa Hubbard is a 70 y.o. female presenting for Breast Pain (Right)  Breast pain: R side Started 3 days ago, after doing a lot of lifting, cleaning, working and using arms No fevers Red, tender nipple since then Today feels slightly better No discharge from nipple, had two episodes a couple of months ago Appetite fine  DM2: Did have eye exam May 2017 [facility] lens [employer] Cutting out sweets and white bread  Relevant past medical, surgical, family and social history reviewed. Allergies and medications reviewed and updated. History  Smoking Status  . Never Smoker  Smokeless Tobacco  . Never Used   ROS: Per HPI   Objective:    BP 122/67   Pulse 80   Temp 97.8 F (36.6 C) (Oral)   Ht 5\' 3"  (1.6 m)   Wt 173 lb 12.8 oz (78.8 kg)   BMI 30.79 kg/m   Wt Readings from Last 3 Encounters:  09/02/16 173 lb 12.8 oz (78.8 kg)  06/18/16 183 lb (83 kg)  06/15/16 184 lb 6.4 oz (83.6 kg)    Gen: NAD, alert, cooperative with exam, NCAT EYES: EOMI, no conjunctival injection, or no icterus CV: WWP Resp:  normal WOB Ext: No edema, warm Neuro: Alert and oriented Breast: R breast with redness, induration around nipple No discharge No lumps or bumps on R side, no axillary LAD R nipple retracted compared with L  Assessment & Plan:  Vanessa Hubbard was seen today for breast pain. Had normal mammogram in 12/2015 Started having R breats pain three days ago Same breast that had 2 episodes of discharge within the last few months. Will send for diagnostic mammogram, treat for cellulitis with keflex.  Diagnoses and all orders for this visit:  Cellulitis of right breast -     cephALEXin (KEFLEX) 500 MG capsule; Take 1 capsule (500 mg total) by mouth 2 (two) times daily.  Breast pain -     MM Digital Diagnostic Unilat R; Future  Abnormal nipple -     MM Digital  Diagnostic Unilat R; Future  Flu shot today  Follow up plan: As scheduled Assunta Found, MD [place]

## 2016-09-10 ENCOUNTER — Encounter: Payer: Self-pay | Admitting: Pediatrics

## 2016-09-10 ENCOUNTER — Other Ambulatory Visit: Payer: Self-pay | Admitting: Pediatrics

## 2016-09-10 DIAGNOSIS — N644 Mastodynia: Secondary | ICD-10-CM

## 2016-09-16 ENCOUNTER — Ambulatory Visit (INDEPENDENT_AMBULATORY_CARE_PROVIDER_SITE_OTHER): Payer: Medicare Other | Admitting: Pediatrics

## 2016-09-16 ENCOUNTER — Encounter: Payer: Self-pay | Admitting: Pediatrics

## 2016-09-16 VITALS — BP 134/70 | HR 81 | Temp 98.2°F | Ht 63.0 in | Wt 172.0 lb

## 2016-09-16 DIAGNOSIS — E119 Type 2 diabetes mellitus without complications: Secondary | ICD-10-CM

## 2016-09-16 DIAGNOSIS — E039 Hypothyroidism, unspecified: Secondary | ICD-10-CM | POA: Diagnosis not present

## 2016-09-16 DIAGNOSIS — E785 Hyperlipidemia, unspecified: Secondary | ICD-10-CM

## 2016-09-16 DIAGNOSIS — I10 Essential (primary) hypertension: Secondary | ICD-10-CM

## 2016-09-16 LAB — BAYER DCA HB A1C WAIVED: HB A1C (BAYER DCA - WAIVED): 5.4 % (ref <7.0)

## 2016-09-16 MED ORDER — METFORMIN HCL 500 MG PO TABS: 500.0000 mg | ORAL_TABLET | Freq: Two times a day (BID) | ORAL | 5 refills | Status: DC

## 2016-09-16 NOTE — Progress Notes (Signed)
  Subjective:   Patient ID: [name], female    DOB: 22-Feb-1946, 70 y.o.   MRN: [id number] CC: Follow-up (3 month)  HPI: JAYEL SIMEK is a 70 y.o. female presenting for Follow-up (3 month)  DM2: Metformin twice a day BGLs in AM low 100s Two more elevated in PM, 140s, 160s Doesn't check daily, several times a week On metformin twice a day Tolerating well  Cellulitis:  Was present on breast Much improved Still slightly tender Dimpling of nipple improved [facility] Center getting records from [facility] then will have diagnostic   HTN: No CP, or SOB Taking med regularly at home  Elevated BMI: Down 10 lbs Watching what she eats Avoiding carbs, sugar   Relevant past medical, surgical, family and social history reviewed. Allergies and medications reviewed and updated. History  Smoking Status  . Never Smoker  Smokeless Tobacco  . Never Used   ROS: Per HPI   Objective:    BP 134/70   Pulse 81   Temp 98.2 F (36.8 C) (Oral)   Ht 5\' 3"  (1.6 m)   Wt 172 lb (78 kg)   BMI 30.47 kg/m   Wt Readings from Last 3 Encounters:  09/16/16 172 lb (78 kg)  09/02/16 173 lb 12.8 oz (78.8 kg)  06/18/16 183 lb (83 kg)    Gen: NAD, alert, cooperative with exam, NCAT EYES: EOMI, no conjunctival injection, or no icterus CV: NRRR, normal S1/S2, no murmur, distal pulses 2+ b/l Resp: CTABL, no wheezes, normal WOB Abd: +BS, soft, NTND. no guarding or organomegaly Ext: No edema, warm Neuro: Alert and oriented, strength equal b/l UE and LE, coordination grossly normal MSK: normal muscle bulk Skin: small < 1cm nodule under the skin with < 67mm opening consistent with epidermal cyst  Assessment & Plan:  Delana was seen today for follow-up.  Diagnoses and all orders for this visit:  Controlled type 2 diabetes mellitus without complication, without long-term current use of insulin (HCC) Last 6.9 On metformin, making dietary changes Recheck today -     Bayer DCA Hb A1c Waived -      metFORMIN (GLUCOPHAGE) 500 MG tablet; Take 1 tablet (500 mg total) by mouth 2 (two) times daily with a meal.  Essential hypertension Adequate control today Cont HCTZ  Hypothyroidism, unspecified type Cont levothyroxine, check TSH -     TSH  Hyperlipidemia with target LDL less than 100 On crestor, recheck lipid panel -     Lipid panel   Follow up plan: Return in about 3 months (around 12/17/2016). Assunta Found, MD [place]

## 2016-09-17 LAB — LIPID PANEL
CHOLESTEROL TOTAL: 116 mg/dL (ref 100–199)
Chol/HDL Ratio: 1.9 ratio units (ref 0.0–4.4)
HDL: 60 mg/dL (ref >39)
LDL Calculated: 29 mg/dL (ref 0–99)
Triglycerides: 137 mg/dL (ref 0–149)
VLDL Cholesterol Cal: 27 mg/dL (ref 5–40)

## 2016-09-17 LAB — TSH: TSH: 0.472 u[IU]/mL (ref 0.450–4.500)

## 2016-10-09 ENCOUNTER — Ambulatory Visit (INDEPENDENT_AMBULATORY_CARE_PROVIDER_SITE_OTHER): Payer: Medicare Other | Admitting: Pediatrics

## 2016-10-09 ENCOUNTER — Encounter: Payer: Self-pay | Admitting: Pediatrics

## 2016-10-09 VITALS — BP 112/65 | HR 88 | Temp 98.4°F | Ht 63.0 in | Wt 172.6 lb

## 2016-10-09 DIAGNOSIS — H65111 Acute and subacute allergic otitis media (mucoid) (sanguinous) (serous), right ear: Secondary | ICD-10-CM

## 2016-10-09 DIAGNOSIS — J069 Acute upper respiratory infection, unspecified: Secondary | ICD-10-CM

## 2016-10-09 MED ORDER — AMOXICILLIN 500 MG PO CAPS: 500.0000 mg | ORAL_CAPSULE | Freq: Two times a day (BID) | ORAL | 0 refills | Status: DC

## 2016-10-09 MED ORDER — HYDROCODONE-HOMATROPINE 5-1.5 MG/5ML PO SYRP: 5.0000 mL | ORAL_SOLUTION | Freq: Four times a day (QID) | ORAL | 0 refills | Status: DC | PRN

## 2016-10-09 NOTE — Progress Notes (Signed)
  Subjective:   Patient ID: Vanessa Hubbard, female    DOB: 1946-08-10, 70 y.o.   MRN: [id number] CC: Sore Throat; Nasal Congestion; Cough; and Ear Pain  HPI: Vanessa Hubbard is a 70 y.o. female presenting for Sore Throat; Nasal Congestion; Cough; and Ear Pain  Started 7 days ago No fevers During the day feels ok, but lots of nasal congestion Afternoon to throughout the night has pain in R side of neck, hurts when she swallows Coughing mostly at night Taking nyquil plus using Cetirizine flonase   Relevant past medical, surgical, family and social history reviewed. Allergies and medications reviewed and updated. History  Smoking Status  . Never Smoker  Smokeless Tobacco  . Never Used   ROS: Per HPI   Objective:    BP 112/65   Pulse 88   Temp 98.4 F (36.9 C) (Oral)   Ht 5\' 3"  (1.6 m)   Wt 172 lb 9.6 oz (78.3 kg)   BMI 30.57 kg/m   Wt Readings from Last 3 Encounters:  10/09/16 172 lb 9.6 oz (78.3 kg)  09/16/16 172 lb (78 kg)  09/02/16 173 lb 12.8 oz (78.8 kg)    Gen: NAD, alert, cooperative with exam, NCAT EYES: EOMI, no conjunctival injection, or no icterus ENT:  R TM red, opaque fluid behind it, L TM red, OP with mild erythema LYMPH: no cervical LAD CV: NRRR, normal S1/S2, no murmur, distal pulses 2+ b/l Resp: CTABL, no wheezes, normal WOB Ext: No edema, warm  Assessment & Plan:  [name] was seen today for sore throat, nasal congestion, cough and ear pain.  Diagnoses and all orders for this visit:  URI with cough and congestion Discussed symptomatic care Hycodan as needed, may cause sleepiness Treating AOm as below -     HYDROcodone-homatropine (HYCODAN) 5-1.5 MG/5ML syrup; Take 5 mLs by mouth every 6 (six) hours as needed for cough.  Acute mucoid otitis media of right ear -     amoxicillin (AMOXIL) 500 MG capsule; Take 1 capsule (500 mg total) by mouth 2 (two) times daily.   Follow up plan: As scheduled Vanessa Found, MD [place]

## 2016-10-23 ENCOUNTER — Encounter: Payer: Self-pay | Admitting: Pediatrics

## 2016-10-23 ENCOUNTER — Ambulatory Visit (INDEPENDENT_AMBULATORY_CARE_PROVIDER_SITE_OTHER): Payer: Medicare Other | Admitting: Pediatrics

## 2016-10-23 VITALS — BP 120/66 | HR 77 | Temp 98.0°F | Ht 63.0 in | Wt 171.6 lb

## 2016-10-23 DIAGNOSIS — J029 Acute pharyngitis, unspecified: Secondary | ICD-10-CM | POA: Diagnosis not present

## 2016-10-23 DIAGNOSIS — R0981 Nasal congestion: Secondary | ICD-10-CM | POA: Diagnosis not present

## 2016-10-23 LAB — RAPID STREP SCREEN (MED CTR MEBANE ONLY): Strep Gp A Ag, IA W/Reflex: NEGATIVE

## 2016-10-23 LAB — CULTURE, GROUP A STREP

## 2016-10-23 MED ORDER — AZITHROMYCIN 250 MG PO TABS: ORAL_TABLET | ORAL | 0 refills | Status: DC

## 2016-10-23 NOTE — Addendum Note (Signed)
Addended by: Wardell Heath on: 10/23/2016 03:48 PM   Modules accepted: Orders

## 2016-10-23 NOTE — Patient Instructions (Signed)
NASAL STEROID USE INSTRUCTIONS  Step 1. Prepare the nose. Blow the nose before administering the drug.  Step 2. Prime and activate the delivery device as recommended by the manufacturer.  Step 3. Position the head by tilting the head forward.  Step 4. Insert the tip of the applicator gently, avoiding contact with the septum.  Step 5. Aim the applicator tip about 45 from the floor of the nose and direct it at the outer corner of the eye on the same side to avoid traumatizing or spraying the septum.  Step 6. Close the other nostril gently with a finger.   Step 7. Sniff or inhale gently while delivering the drug.   

## 2016-10-23 NOTE — Progress Notes (Signed)
  Subjective:   Patient ID: Vanessa Hubbard, female    DOB: November 24, 1945, 70 y.o.   MRN: [id number] CC: Sore Throat  HPI: Vanessa Hubbard is a 70 y.o. female presenting for Sore Throat  Seen 2 weeks ago for URI started on amoxicillin then No fevers Pressure better in her sinuses Throat with minimal improvement, if not worse Still with some drainage Very sore at night Coughing occasionally   Relevant past medical, surgical, family and social history reviewed. Allergies and medications reviewed and updated. History  Smoking Status  . Never Smoker  Smokeless Tobacco  . Never Used   ROS: Per HPI   Objective:    BP 120/66   Pulse 77   Temp 98 F (36.7 C) (Oral)   Ht 5\' 3"  (1.6 m)   Wt 171 lb 9.6 oz (77.8 kg)   BMI 30.40 kg/m   Wt Readings from Last 3 Encounters:  10/23/16 171 lb 9.6 oz (77.8 kg)  10/09/16 172 lb 9.6 oz (78.3 kg)  09/16/16 172 lb (78 kg)    Gen: NAD, alert, cooperative with exam, NCAT EYES: EOMI, no conjunctival injection, or no icterus ENT:  L TM injected, R TM pink, nl LR, OP with mild erythema LYMPH: no cervical LAD CV: NRRR, normal S1/S2, no murmur Resp: CTABL, no wheezes, normal WOB Ext: No edema, warm Neuro: Alert and oriented  Assessment & Plan:  Vanessa Hubbard was seen today for sore throat.  Diagnoses and all orders for this visit:  Sore throat -     Rapid strep screen (not at [facility])  Sinus congestion flonase daily Cont antihistamine daily Consider sinus rinses, pt has had bad experience in the past -     azithromycin (ZITHROMAX) 250 MG tablet; Take 500 mg once, then 250 mg for four days   Follow up plan: Return if symptoms worsen or fail to improve. Assunta Found, MD [place]

## 2016-10-26 LAB — CULTURE, GROUP A STREP: STREP A CULTURE: NEGATIVE

## 2016-11-04 ENCOUNTER — Ambulatory Visit
Admission: RE | Admit: 2016-11-04 | Discharge: 2016-11-04 | Disposition: A | Discharge status: Home / Self Care | Payer: Medicare Other | Source: Ambulatory Visit | Attending: Pediatrics | Admitting: Pediatrics

## 2016-11-04 DIAGNOSIS — N644 Mastodynia: Secondary | ICD-10-CM

## 2016-11-04 DIAGNOSIS — N649 Disorder of breast, unspecified: Secondary | ICD-10-CM

## 2016-11-04 DIAGNOSIS — N6452 Nipple discharge: Secondary | ICD-10-CM | POA: Diagnosis not present

## 2016-11-10 ENCOUNTER — Encounter: Payer: Self-pay | Admitting: Pediatrics

## 2016-12-09 ENCOUNTER — Other Ambulatory Visit: Payer: Medicare Other

## 2016-12-18 ENCOUNTER — Encounter: Payer: Self-pay | Admitting: Pediatrics

## 2016-12-18 ENCOUNTER — Ambulatory Visit (INDEPENDENT_AMBULATORY_CARE_PROVIDER_SITE_OTHER): Payer: Medicare Other | Admitting: Pediatrics

## 2016-12-18 VITALS — BP 129/75 | HR 76 | Temp 97.2°F | Ht 63.0 in | Wt 166.8 lb

## 2016-12-18 DIAGNOSIS — E039 Hypothyroidism, unspecified: Secondary | ICD-10-CM | POA: Diagnosis not present

## 2016-12-18 DIAGNOSIS — E119 Type 2 diabetes mellitus without complications: Secondary | ICD-10-CM | POA: Diagnosis not present

## 2016-12-18 DIAGNOSIS — I10 Essential (primary) hypertension: Secondary | ICD-10-CM

## 2016-12-18 DIAGNOSIS — Z6832 Body mass index (BMI) 32.0-32.9, adult: Secondary | ICD-10-CM

## 2016-12-18 LAB — CMP14+EGFR
ALK PHOS: 71 IU/L (ref 39–117)
ALT: 16 IU/L (ref 0–32)
AST: 18 IU/L (ref 0–40)
Albumin/Globulin Ratio: 2.4 — ABNORMAL HIGH (ref 1.2–2.2)
Albumin: 4.7 g/dL (ref 3.5–4.8)
BILIRUBIN TOTAL: 1.7 mg/dL — AB (ref 0.0–1.2)
BUN/Creatinine Ratio: 20 (ref 12–28)
BUN: 14 mg/dL (ref 8–27)
CO2: 27 mmol/L (ref 18–29)
CREATININE: 0.7 mg/dL (ref 0.57–1.00)
Calcium: 9.9 mg/dL (ref 8.7–10.3)
Chloride: 100 mmol/L (ref 96–106)
GFR, EST AFRICAN AMERICAN: 100 mL/min/{1.73_m2} (ref >59)
GFR, EST NON AFRICAN AMERICAN: 87 mL/min/{1.73_m2} (ref >59)
GLUCOSE: 108 mg/dL — AB (ref 65–99)
Globulin, Total: 2 g/dL (ref 1.5–4.5)
Potassium: 3.9 mmol/L (ref 3.5–5.2)
SODIUM: 143 mmol/L (ref 134–144)
Total Protein: 6.7 g/dL (ref 6.0–8.5)

## 2016-12-18 LAB — BAYER DCA HB A1C WAIVED: HB A1C: 5.7 % (ref <7.0)

## 2016-12-18 NOTE — Progress Notes (Signed)
  Subjective:   Patient ID: Vanessa Hubbard, female    DOB: 21-Nov-1945, 71 y.o.   MRN: [id number] CC: Follow-up (3 month)  HPI: Vanessa Hubbard is a 71 y.o. female presenting for Follow-up (3 month)  DM2: has noticed fasting BGLs increasing Not exercising as much with winter Eating two meals a day Sometimes eats snack at night because hungry, tends to get BGLs higher in the morning, highs of 188, 177 over last couple of weeks Otherwise has been low 100s to 150s  Did get flu shot  Taking thyroid medicine regularly  Osteopenia: Takes vit D, calcium regularly Last dexa scan last year  HTN: Taking meds regularly No CP, no SOB, no HA  Relevant past medical, surgical, family and social history reviewed. Allergies and medications reviewed and updated. History  Smoking Status  . Never Smoker  Smokeless Tobacco  . Never Used   ROS: Per HPI   Objective:    BP 129/75   Pulse 76   Temp 97.2 F (36.2 C) (Oral)   Ht '5\' 3"'$  (1.6 m)   Wt 166 lb 12.8 oz (75.7 kg)   BMI 29.55 kg/m   Wt Readings from Last 3 Encounters:  12/18/16 166 lb 12.8 oz (75.7 kg)  10/23/16 171 lb 9.6 oz (77.8 kg)  10/09/16 172 lb 9.6 oz (78.3 kg)    Gen: NAD, alert, cooperative with exam, NCAT EYES: EOMI, no conjunctival injection, or no icterus ENT:  TMs pearly gray b/l, OP without erythema LYMPH: no cervical LAD CV: NRRR, normal S1/S2, no murmur, distal pulses 2+ b/l Resp: CTABL, no wheezes, normal WOB Abd: +BS, soft, NTND. no guarding or organomegaly Ext: No edema, warm Neuro: Alert and oriented MSK: normal muscle bulk  Assessment & Plan:  Vanessa Hubbard was seen today for follow-up multiple med problems.  Diagnoses and all orders for this visit:  Controlled type 2 diabetes mellitus without complication, without long-term current use of insulin (HCC) A1c 5.7 Cont meds, occasionally checking fasting BGLs -     Bayer DCA Hb A1c Waived  Essential hypertension Well controlled, cont current med -      CMP14+EGFR  Hypothyroidism, unspecified type Nl TSH 3 mo ago Cont current meds  BMI 32.0-32.9,adult Cont lifestyle changes Planning to start walking more often  Follow up plan: Return in about 6 months (around 06/17/2017). Assunta Found, MD [place]

## 2017-01-27 ENCOUNTER — Ambulatory Visit (INDEPENDENT_AMBULATORY_CARE_PROVIDER_SITE_OTHER): Payer: Medicare Other | Admitting: Pediatrics

## 2017-01-27 ENCOUNTER — Encounter: Payer: Self-pay | Admitting: Pharmacist

## 2017-01-27 ENCOUNTER — Ambulatory Visit: Payer: Medicare Other | Admitting: Pharmacist

## 2017-01-27 VITALS — BP 127/71 | HR 79 | Ht 63.0 in | Wt 166.2 lb

## 2017-01-27 DIAGNOSIS — Z0000 Encounter for general adult medical examination without abnormal findings: Secondary | ICD-10-CM

## 2017-01-27 DIAGNOSIS — L0390 Cellulitis, unspecified: Secondary | ICD-10-CM

## 2017-01-27 MED ORDER — CEPHALEXIN 500 MG PO CAPS: 500.0000 mg | ORAL_CAPSULE | Freq: Four times a day (QID) | ORAL | 0 refills | Status: DC

## 2017-01-27 NOTE — Patient Instructions (Addendum)
  Vanessa Hubbard , Thank you for taking time to come for your Medicare Wellness Visit. I appreciate your ongoing commitment to your health goals. Please review the following plan we discussed and let me know if I can assist you in the future.   These are the goals we discussed: Continue with current diet - you have lost 15 lbs since last years.   Continue with plan to restart walking with start of warmer weather - goal is 150 minutes per week.    This is a list of the screening recommended for you and due dates:  Health Maintenance  Topic Date Due  . Eye exam for diabetics  01/30/1954  . Stool Blood Test  04/21/2017  . Hemoglobin A1C  06/17/2017  . Urine Protein Check  06/18/2017  . Complete foot exam   12/18/2017  . Mammogram  01/06/2018  . DEXA scan (bone density measurement)  01/21/2018  . Tetanus Vaccine  06/09/2021  . Flu Shot  Completed  .  Hepatitis C: One time screening is recommended by Center for Disease Control  (CDC) for  adults born from [birth year] through 1967.   Completed  . Pneumonia vaccines  Completed

## 2017-01-27 NOTE — Progress Notes (Signed)
Patient ID: Vanessa Hubbard, female   DOB: 05-22-1946, 71 y.o.   MRN: [id number]     Subjective:   Vanessa Hubbard is a 71 y.o. female who presents for a subsequent Medicare Annual Wellness Visit.  Today she complains that she has a red, feverish rash on her right breast.  This is similar to a rash she had in October 2017.   Patient was seen by Dr Evette Doffing for evaluation--  HPI: Started having redness R breast two days ago Below and including R nipple Discharge a couple of times, scant amount once Nipple slightly inverted since pain started Warmth, redness present No fevers, appetite normal, feeling well otherwise Similar but not hurting as much as R breast cellulitis from 6 mo ago At that time Had diagnostic mammogram R breast that was normal Has screening b/l mammo scheduled for last next month ROS: per HPI PE:  R breast with apprx 2-3 cm in red induration just below and partially including R nipple. No flutuance, no discharge from nipple Nipple R side slihtly inverted compared to L No other breast masses or LAD b/l A/P: 70yoF with recurrent R breast cellulitis, will treat with keflex QID for 7 days. Due for screening mammogram, scheduled. Diagnostic mammogram for same from 3 mo ago normal. Any return of discharge, let me know.  No other complaints today.  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   Marital history: married - husband is Financial trader;   They have 2 daughters and 1 son. Alcohol/Tobacco/Substances: none   Current Medications (verified) Outpatient Encounter Prescriptions as of 01/27/2017  Medication Sig  . aspirin 81 MG tablet Take 81 mg by mouth every other day.   . B-D ULTRA-FINE 33 LANCETS MISC Use to check BG daily (Dx 11.9 type 2 DM)  . cetirizine (ZYRTEC) 10 MG tablet Take 1 tablet (10 mg total) by mouth daily as needed. Allergies.  . Cholecalciferol (VITAMIN D3) 2000 UNITS TABS Take 1 tablet by mouth.  . fluticasone (FLONASE) 50  MCG/ACT nasal spray Place 2 sprays into both nostrils daily.  Marland Kitchen glucose blood (BAYER CONTOUR NEXT TEST) test strip Use to check BG once a day. Dx 11.9 - type 2 diabetes  . hydrochlorothiazide (HYDRODIURIL) 25 MG tablet TAKE ONE TABLET BY MOUTH ONCE DAILY  . levothyroxine (SYNTHROID, LEVOTHROID) 100 MCG tablet Take 1 tablet (100 mcg total) by mouth daily.  . metFORMIN (GLUCOPHAGE) 500 MG tablet Take 1 tablet (500 mg total) by mouth 2 (two) times daily with a meal.  . Multiple Vitamin (MULTIVITAMIN) tablet Take 1 tablet by mouth daily.  . rosuvastatin (CRESTOR) 10 MG tablet Take 0.5 tablets (5 mg total) by mouth daily.  . cephALEXin (KEFLEX) 500 MG capsule Take 1 capsule (500 mg total) by mouth 4 (four) times daily.   No facility-administered encounter medications on file as of 01/27/2017.     Allergies (verified) Patient has no known allergies.   History: Past Medical History:  Diagnosis Date  . Broken ankle left  . Diabetes mellitus without complication ([place])   . Fibroids   . History of fractured kneecap   . Hyperlipidemia   . Hypertension   . Osteopenia   . Thyroid disease    Past Surgical History:  Procedure Laterality Date  . ABDOMINAL HYSTERECTOMY     complete   . ANKLE SURGERY    . BREAST LUMPECTOMY Right    benign  . CHOLECYSTECTOMY    . KNEE SURGERY Right   .  ORIF PATELLA  01/22/2012   Procedure: OPEN REDUCTION INTERNAL (ORIF) FIXATION PATELLA;  Surgeon: Meredith Pel, MD;  Location: [facility];  Service: Orthopedics;  Laterality: Left;  . TONSILLECTOMY     Family History  Problem Relation Age of Onset  . Jaundice Mother   . Cancer Mother     breast  . COPD Father   . Heart disease Father     triple bypass  . Heart failure Father   . Diabetes Father   . Thyroid disease Daughter   . Healthy Son   . Healthy Daughter    Social History   Occupational History  . Not on file.   Social History Main Topics  . Smoking status: Never Smoker  . Smokeless  tobacco: Never Used  . Alcohol use No  . Drug use: No  . Sexual activity: No    Do you feel safe at home?  Yes Are there smokers in your home (other than you)? No  Dietary issues and exercise activities: Current Exercise Habits: Home exercise routine, Type of exercise: walking, Time (Minutes): 40, Frequency (Times/Week): 5, Weekly Exercise (Minutes/Week): 200, Intensity: Moderate  Current Dietary habits:  Over the last year patient has worked hard to decreased CHO intake - specifically bread and sweets.  She has increased non starchy vegetables.   She is snacking on carrots  Or fruits.  Objective:    Today's Vitals   01/27/17 1150  BP: 127/71  Pulse: 79  Weight: 166 lb 3.2 oz (75.4 kg)  Height: 5\' 3"  (1.6 m)   Body mass index is 29.44 kg/m.   Last A1c = 5.7% (02/29/2018)  Activities of Daily Living In your present state of health, do you have any difficulty performing the following activities: 01/27/2017  Hearing? N  Vision? N  Difficulty concentrating or making decisions? N  Walking or climbing stairs? N  Dressing or bathing? N  Doing errands, shopping? N  Preparing Food and eating ? N  Using the Toilet? N  In the past six months, have you accidently leaked urine? N  Do you have problems with loss of bowel control? N  Managing your Medications? N  Managing your Finances? N  Housekeeping or managing your Housekeeping? N  Some recent data might be hidden     Cardiac Risk Factors include: advanced age (>23men, >31 women);diabetes mellitus;dyslipidemia;family history of premature cardiovascular disease  Depression Screen PHQ 2/9 Scores 01/27/2017 12/18/2016 10/23/2016 10/09/2016  PHQ - 2 Score 0 0 0 0     Fall Risk Fall Risk  01/27/2017 12/18/2016 10/23/2016 10/09/2016 09/16/2016  Falls in the past year? No No No No No  Number falls in past yr: - - - - -  Injury with Fall? - - - - -    Cognitive Function: MMSE - Mini Mental State Exam 01/27/2017 01/22/2016  Orientation  to time 5 5  Orientation to Place 5 5  Registration 3 3  Attention/ Calculation 5 4  Recall 3 3  Language- name 2 objects 2 2  Language- repeat 1 1  Language- follow 3 step command 3 3  Language- read & follow direction 1 1  Write a sentence 1 1  Copy design 1 1  Total score 30 29    Immunizations and Health Maintenance Immunization History  Administered Date(s) Administered  . Influenza,inj,Quad PF,36+ Mos 09/08/2013, 08/30/2014, 08/27/2015, 09/02/2016  . Pneumococcal Conjugate-13 08/28/2015  . Pneumococcal Polysaccharide-23 06/10/2011  . Zoster 01/03/2014   Health Maintenance Due  Topic  Date Due  . OPHTHALMOLOGY EXAM  01/30/1954    Patient Care Team: Eustaquio Maize, MD as PCP - General (Pediatrics) Truc Manus Gunning, OD (Optometry) Clarene Essex, MD as Consulting Physician (Gastroenterology)  Indicate any recent Medical Services you may have received from other than [facility] providers in the past year (date may be approximate).    Assessment:    Annual Wellness Visit  Cellulitis of right breast Overweight - has lost 15lbs over the last year.  BMI down from 32.42 to 29.44  Screening Tests Health Maintenance  Topic Date Due  . OPHTHALMOLOGY EXAM  01/30/1954  . COLON CANCER SCREENING ANNUAL FOBT  04/21/2017  . HEMOGLOBIN A1C  06/17/2017  . URINE MICROALBUMIN  06/18/2017  . FOOT EXAM  12/18/2017  . MAMMOGRAM  01/06/2018  . DEXA SCAN  01/21/2018  . TETANUS/TDAP  06/09/2021  . INFLUENZA VACCINE  Completed  . Hepatitis C Screening  Completed  . PNA vac Low Risk Adult  Completed        Plan:   During the course of the visit Vanessa Hubbard was educated and counseled about the following appropriate screening and preventive services:   Vaccines to include Pneumoccal, Influenza, Td, Zostavax - all vaccines are UTD  Cephalexin 500mg  qid for cellulitis  Colorectal cancer screening - FOBT is UTD   Cardiovascular disease screening - last EKG 2016  BP and lipids are at goals -  continue rosuvastatin and HCTZ  Diabetes - well controlled with metformin and diet  Bone Denisty / Osteoporosis Screening - UTD, next due 01/2018  Mammogram - UTD and next mammogram already scheduled  Glaucoma screening / Diabetic Eye Exam - eye exam needed and patient reminded to make appt ASAP  Nutrition counseling - continue with current changes - very proud of patient's progress  Advanced Directives - packets provided  Physical Activity - restart walking daily (plan to start when weather warms).  Reminded patient of indoor activities to stay active.     Patient Instructions (the written plan) were given to the patient.   Cherre Robins, PharmD   01/27/2017     I have reviewed and agree with the above AWV documentation.   Assunta Found, MD [facility] 01/27/2017, 8:44 PM

## 2017-02-08 ENCOUNTER — Other Ambulatory Visit: Payer: Self-pay | Admitting: *Deleted

## 2017-02-08 DIAGNOSIS — E119 Type 2 diabetes mellitus without complications: Secondary | ICD-10-CM

## 2017-02-08 MED ORDER — METFORMIN HCL 500 MG PO TABS: 500.0000 mg | ORAL_TABLET | Freq: Two times a day (BID) | ORAL | 5 refills | Status: DC

## 2017-03-23 ENCOUNTER — Other Ambulatory Visit: Payer: Self-pay | Admitting: Nurse Practitioner

## 2017-03-23 DIAGNOSIS — E039 Hypothyroidism, unspecified: Secondary | ICD-10-CM

## 2017-03-29 ENCOUNTER — Other Ambulatory Visit: Payer: Self-pay | Admitting: Nurse Practitioner

## 2017-03-29 DIAGNOSIS — E785 Hyperlipidemia, unspecified: Secondary | ICD-10-CM

## 2017-04-07 ENCOUNTER — Encounter: Payer: Medicare Other | Admitting: *Deleted

## 2017-04-07 DIAGNOSIS — Z1231 Encounter for screening mammogram for malignant neoplasm of breast: Secondary | ICD-10-CM | POA: Diagnosis not present

## 2017-04-12 ENCOUNTER — Other Ambulatory Visit: Payer: Self-pay | Admitting: Pediatrics

## 2017-04-12 DIAGNOSIS — E119 Type 2 diabetes mellitus without complications: Secondary | ICD-10-CM

## 2017-04-21 ENCOUNTER — Encounter: Payer: Self-pay | Admitting: Pediatrics

## 2017-06-08 ENCOUNTER — Other Ambulatory Visit: Payer: Self-pay | Admitting: Nurse Practitioner

## 2017-06-08 DIAGNOSIS — I10 Essential (primary) hypertension: Secondary | ICD-10-CM

## 2017-06-11 ENCOUNTER — Other Ambulatory Visit: Payer: Self-pay | Admitting: Pediatrics

## 2017-06-11 DIAGNOSIS — E119 Type 2 diabetes mellitus without complications: Secondary | ICD-10-CM

## 2017-06-17 ENCOUNTER — Encounter: Payer: Self-pay | Admitting: Pediatrics

## 2017-06-17 ENCOUNTER — Ambulatory Visit (INDEPENDENT_AMBULATORY_CARE_PROVIDER_SITE_OTHER): Payer: Medicare Other | Admitting: Pediatrics

## 2017-06-17 VITALS — BP 124/63 | HR 67 | Temp 97.9°F | Ht 63.0 in | Wt 166.8 lb

## 2017-06-17 DIAGNOSIS — E785 Hyperlipidemia, unspecified: Secondary | ICD-10-CM

## 2017-06-17 DIAGNOSIS — E119 Type 2 diabetes mellitus without complications: Secondary | ICD-10-CM

## 2017-06-17 DIAGNOSIS — Z1211 Encounter for screening for malignant neoplasm of colon: Secondary | ICD-10-CM

## 2017-06-17 DIAGNOSIS — I10 Essential (primary) hypertension: Secondary | ICD-10-CM

## 2017-06-17 DIAGNOSIS — E039 Hypothyroidism, unspecified: Secondary | ICD-10-CM | POA: Diagnosis not present

## 2017-06-17 LAB — BAYER DCA HB A1C WAIVED: HB A1C (BAYER DCA - WAIVED): 5.7 % (ref <7.0)

## 2017-06-17 MED ORDER — ROSUVASTATIN CALCIUM 10 MG PO TABS: 5.0000 mg | ORAL_TABLET | Freq: Every day | ORAL | 1 refills | Status: DC

## 2017-06-17 MED ORDER — HYDROCHLOROTHIAZIDE 25 MG PO TABS: 25.0000 mg | ORAL_TABLET | Freq: Every day | ORAL | 1 refills | Status: DC

## 2017-06-17 MED ORDER — METFORMIN HCL 500 MG PO TABS: 500.0000 mg | ORAL_TABLET | Freq: Two times a day (BID) | ORAL | 0 refills | Status: DC

## 2017-06-17 MED ORDER — LEVOTHYROXINE SODIUM 100 MCG PO TABS: 100.0000 ug | ORAL_TABLET | Freq: Every day | ORAL | 1 refills | Status: DC

## 2017-06-17 MED ORDER — METFORMIN HCL 500 MG PO TABS
500.0000 mg | ORAL_TABLET | Freq: Two times a day (BID) | ORAL | 1 refills | Status: DC
Start: 2017-06-17 — End: 2017-09-09

## 2017-06-17 MED ORDER — BD LANCET ULTRAFINE 33G MISC: 1 refills | Status: AC

## 2017-06-17 MED ORDER — GLUCOSE BLOOD VI STRP: ORAL_STRIP | 2 refills | Status: DC

## 2017-06-17 NOTE — Progress Notes (Signed)
  Subjective:   Patient ID: Vanessa Hubbard, female    DOB: [birth year], 71 y.o.   MRN: [id number] CC: Follow-up (6 month)  HPI: Vanessa Hubbard is a 71 y.o. female presenting for Follow-up (6 month)  DM2: says BGLs low 100s fasting in the morning, sometimes 90s  Hypothyroidism: taking med daily, no s/e  Epidermoid cyst: gets slightly swollen and tender at times just to L of spine in mid back No recent redness, no discharge Not interested in removal right now  HTN: takes meds daily, no CP, no SOB  Elevated BMI Not walking as regularly with hot weather Continues to try to watch what she eats, avoiding sugary foods   Relevant past medical, surgical, family and social history reviewed. Allergies and medications reviewed and updated. History  Smoking Status  . Never Smoker  Smokeless Tobacco  . Never Used   ROS: Per HPI   Objective:    BP 124/63   Pulse 67   Temp 97.9 F (36.6 C) (Oral)   Ht '5\' 3"'$  (1.6 m)   Wt 166 lb 12.8 oz (75.7 kg)   BMI 29.55 kg/m   Wt Readings from Last 3 Encounters:  06/17/17 166 lb 12.8 oz (75.7 kg)  01/27/17 166 lb 3.2 oz (75.4 kg)  12/18/16 166 lb 12.8 oz (75.7 kg)    Gen: NAD, alert, cooperative with exam, NCAT EYES: EOMI, no conjunctival injection, or no icterus ENT: OP without erythema CV: NRRR, normal S1/S2, no murmur, distal pulses 2+ b/l Resp: CTABL, no wheezes, normal WOB Abd: +BS, soft, NTND. Ext: No edema, warm Neuro: Alert and oriented MSK: normal muscle bulk Skin: mid back apprx 2cm flat nodule in skin with <71m black opening on surface consistent with epidermoid cyst, non-tender, no redness  Assessment & Plan:  GVanitywas seen today for follow-up.  Diagnoses and all orders for this visit:  Essential hypertension Adequate control, cont current meds -     Bayer DCA Hb A1c Waived -     CMP14+EGFR -     hydrochlorothiazide (HYDRODIURIL) 25 MG tablet; Take 1 tablet (25 mg total) by mouth daily.  Hypothyroidism, unspecified  type Stable, cont levothyroxine -     levothyroxine (SYNTHROID, LEVOTHROID) 100 MCG tablet; Take 1 tablet (100 mcg total) by mouth daily. -     Thyroid Panel With TSH  Type 2 diabetes mellitus without complication, without long-term current use of insulin (HCC) A1c 5.7, trial off of metformin Cont to check BGls about once a week, let me know if regularly over 150 -     Bayer DCA Hb A1c Waived -     CMP14+EGFR  Hyperlipidemia with target LDL less than 100 Stable, cont statin -     CMP14+EGFR -     rosuvastatin (CRESTOR) 10 MG tablet; Take 0.5 tablets (5 mg total) by mouth daily.  Colon cancer screening -     Fecal occult blood, imunochemical; Future  Other orders -     B-D ULTRA-FINE 33 LANCETS MISC; Use to check BG daily (Dx 11.9 type 2 DM) -     glucose blood (BAYER CONTOUR NEXT TEST) test strip; Use to check BG once a day. Dx 11.9 - type 2 diabetes   Follow up plan: 6 mo CAssunta Found MD [place]

## 2017-06-18 LAB — THYROID PANEL WITH TSH
FREE THYROXINE INDEX: 2.6 (ref 1.2–4.9)
T3 Uptake Ratio: 27 % (ref 24–39)
T4, Total: 9.6 ug/dL (ref 4.5–12.0)
TSH: 0.528 u[IU]/mL (ref 0.450–4.500)

## 2017-06-18 LAB — CMP14+EGFR
ALBUMIN: 4.4 g/dL (ref 3.5–4.8)
ALK PHOS: 70 IU/L (ref 39–117)
ALT: 14 IU/L (ref 0–32)
AST: 21 IU/L (ref 0–40)
Albumin/Globulin Ratio: 2.4 — ABNORMAL HIGH (ref 1.2–2.2)
BILIRUBIN TOTAL: 1.3 mg/dL — AB (ref 0.0–1.2)
BUN / CREAT RATIO: 21 (ref 12–28)
BUN: 14 mg/dL (ref 8–27)
CHLORIDE: 100 mmol/L (ref 96–106)
CO2: 27 mmol/L (ref 20–29)
CREATININE: 0.68 mg/dL (ref 0.57–1.00)
Calcium: 9.9 mg/dL (ref 8.7–10.3)
GFR calc Af Amer: 100 mL/min/{1.73_m2} (ref >59)
GFR calc non Af Amer: 87 mL/min/{1.73_m2} (ref >59)
GLUCOSE: 106 mg/dL — AB (ref 65–99)
Globulin, Total: 1.8 g/dL (ref 1.5–4.5)
Potassium: 4.6 mmol/L (ref 3.5–5.2)
Sodium: 142 mmol/L (ref 134–144)
Total Protein: 6.2 g/dL (ref 6.0–8.5)

## 2017-07-15 DIAGNOSIS — H524 Presbyopia: Secondary | ICD-10-CM | POA: Diagnosis not present

## 2017-07-15 DIAGNOSIS — H2513 Age-related nuclear cataract, bilateral: Secondary | ICD-10-CM | POA: Diagnosis not present

## 2017-07-26 ENCOUNTER — Other Ambulatory Visit: Payer: Medicare Other

## 2017-07-26 DIAGNOSIS — Z1211 Encounter for screening for malignant neoplasm of colon: Secondary | ICD-10-CM

## 2017-07-29 LAB — FECAL OCCULT BLOOD, IMMUNOCHEMICAL: Fecal Occult Bld: NEGATIVE

## 2017-09-08 ENCOUNTER — Ambulatory Visit (INDEPENDENT_AMBULATORY_CARE_PROVIDER_SITE_OTHER): Payer: Medicare Other

## 2017-09-08 DIAGNOSIS — Z23 Encounter for immunization: Secondary | ICD-10-CM

## 2017-09-09 ENCOUNTER — Other Ambulatory Visit: Payer: Self-pay | Admitting: *Deleted

## 2017-09-09 DIAGNOSIS — E119 Type 2 diabetes mellitus without complications: Secondary | ICD-10-CM

## 2017-09-09 MED ORDER — METFORMIN HCL 500 MG PO TABS: 500.0000 mg | ORAL_TABLET | Freq: Two times a day (BID) | ORAL | 1 refills | Status: DC

## 2017-09-27 ENCOUNTER — Other Ambulatory Visit: Payer: Self-pay

## 2017-09-27 DIAGNOSIS — E785 Hyperlipidemia, unspecified: Secondary | ICD-10-CM

## 2017-09-27 MED ORDER — ROSUVASTATIN CALCIUM 10 MG PO TABS
5.0000 mg | ORAL_TABLET | Freq: Every day | ORAL | 0 refills | Status: DC
Start: 2017-09-27 — End: 2017-12-20

## 2017-12-17 ENCOUNTER — Ambulatory Visit: Payer: Medicare Other | Admitting: Pediatrics

## 2017-12-20 ENCOUNTER — Encounter: Payer: Self-pay | Admitting: Pediatrics

## 2017-12-20 ENCOUNTER — Ambulatory Visit (INDEPENDENT_AMBULATORY_CARE_PROVIDER_SITE_OTHER): Payer: Medicare Other

## 2017-12-20 ENCOUNTER — Ambulatory Visit (INDEPENDENT_AMBULATORY_CARE_PROVIDER_SITE_OTHER): Payer: Medicare Other | Admitting: Pediatrics

## 2017-12-20 VITALS — BP 119/73 | HR 78 | Temp 98.1°F | Ht 63.0 in | Wt 170.4 lb

## 2017-12-20 DIAGNOSIS — G8929 Other chronic pain: Secondary | ICD-10-CM | POA: Diagnosis not present

## 2017-12-20 DIAGNOSIS — E119 Type 2 diabetes mellitus without complications: Secondary | ICD-10-CM

## 2017-12-20 DIAGNOSIS — M5441 Lumbago with sciatica, right side: Secondary | ICD-10-CM | POA: Diagnosis not present

## 2017-12-20 DIAGNOSIS — M25551 Pain in right hip: Secondary | ICD-10-CM

## 2017-12-20 DIAGNOSIS — E039 Hypothyroidism, unspecified: Secondary | ICD-10-CM | POA: Diagnosis not present

## 2017-12-20 DIAGNOSIS — I10 Essential (primary) hypertension: Secondary | ICD-10-CM

## 2017-12-20 DIAGNOSIS — E785 Hyperlipidemia, unspecified: Secondary | ICD-10-CM | POA: Diagnosis not present

## 2017-12-20 LAB — BAYER DCA HB A1C WAIVED: HB A1C: 5.6 % (ref <7.0)

## 2017-12-20 MED ORDER — LEVOTHYROXINE SODIUM 100 MCG PO TABS: 100.0000 ug | ORAL_TABLET | Freq: Every day | ORAL | 1 refills | Status: DC

## 2017-12-20 MED ORDER — ROSUVASTATIN CALCIUM 10 MG PO TABS: 5.0000 mg | ORAL_TABLET | Freq: Every day | ORAL | 0 refills | Status: DC

## 2017-12-20 MED ORDER — HYDROCHLOROTHIAZIDE 25 MG PO TABS: 25.0000 mg | ORAL_TABLET | Freq: Every day | ORAL | 1 refills | Status: DC

## 2017-12-20 MED ORDER — METFORMIN HCL 500 MG PO TABS: 500.0000 mg | ORAL_TABLET | Freq: Two times a day (BID) | ORAL | 1 refills | Status: DC

## 2017-12-20 MED ORDER — METFORMIN HCL 500 MG PO TABS
500.0000 mg | ORAL_TABLET | Freq: Two times a day (BID) | ORAL | 1 refills | Status: DC
Start: 2017-12-20 — End: 2018-06-20

## 2017-12-20 NOTE — Progress Notes (Signed)
Subjective:   Patient ID: Vanessa Hubbard, female    DOB: 25-Sep-1946, 72 y.o.   MRN: [id number] CC: Follow-up (6  month) Multiple medical problems HPI: [name] is a 72 y.o. female presenting for Follow-up (6  month) Diabetes: On metformin. [facility]. Avoiding sugary foods at home  Low back and hip pain: For the last couple of weeks has had pain that starts in her right upper buttock/lower back goes down her buttock into her right thigh and right groin.  Worse with standing and walking for periods of time.  Improves when she rests.  Has not taken anything for the pain including Tylenol or ibuprofen.  No recent falls.  Denies any injury.  Cannot think of any change in her activity when the pain started.  Has not had problems in the past with her low back or hip.  Feels like a dull ache much of the time.  Hypertension: Taking her medicines regularly.  No chest pain or shortness of breath.  Hyperlipidemia: Taking her medicine regularly, no side effects  Hypothyroidism: Taking medicine regularly  Relevant past medical, surgical, family and social history reviewed. Allergies and medications reviewed and updated. Social History   Tobacco Use  Smoking Status Never Smoker  Smokeless Tobacco Never Used   ROS: Per HPI   Objective:    BP 119/73   Pulse 78   Temp 98.1 F (36.7 C) (Oral)   Ht 5\' 3"  (1.6 m)   Wt 170 lb 6.4 oz (77.3 kg)   BMI 30.19 kg/m   Wt Readings from Last 3 Encounters:  12/20/17 170 lb 6.4 oz (77.3 kg)  06/17/17 166 lb 12.8 oz (75.7 kg)  01/27/17 166 lb 3.2 oz (75.4 kg)    Gen: NAD, alert, cooperative with exam, NCAT EYES: EOMI, no conjunctival injection, or no icterus ENT:  TMs pearly gray b/l, OP without erythema LYMPH: no cervical LAD CV: NRRR, normal S1/S2, no murmur, distal pulses 2+ b/l Resp: CTABL, no wheezes, normal WOB Abd: +BS, soft, NTND.  Ext: No edema, warm Neuro: Alert and oriented, strength equal b/l UE and  LE, coordination grossly normal MSK: Normal external rotation bilateral hips, slightly decreased bilaterally with internal rotation at the hip.  No pain with range of motion bilateral hips.  Negative straight leg raise.  No point tenderness over spine.  Mildly tender to palpation soft muscle tissue/paraspinal muscles right lower back.  Assessment & Plan:  Wilna was seen today for follow-up.  Diagnoses and all orders for this visit:  Diabetes mellitus without complication ([place]) We will get A1c today.  Continue Metformin.  Continue avoiding sugary foods. -     Bayer DCA Hb A1c Waived -     Microalbumin / creatinine urine ratio  Hypothyroidism, unspecified type Stable, continue below -     levothyroxine (SYNTHROID, LEVOTHROID) 100 MCG tablet; Take 1 tablet (100 mcg total) by mouth daily.  Hyperlipidemia with target LDL less than 100 Stable, continue below -     rosuvastatin (CRESTOR) 10 MG tablet; Take 0.5 tablets (5 mg total) by mouth daily.  Essential hypertension Stable, continue below -     hydrochlorothiazide (HYDRODIURIL) 25 MG tablet; Take 1 tablet (25 mg total) by mouth daily.  Pain of right hip joint Given groin pain we will get x-ray to evaluate for arthritis.  Recommend starting Tylenol regularly, okay to take Aleve twice a day to see if helps with the pain.  If not improving  let us know. -     DG HIP UNILAT W OR W/O PELVIS 2-3 VIEWS RIGHT; Future  Chronic right-sided low back pain with right-sided sciatica Rest, ice, heat, gentle back exercises, stay active with gentle walking.  If not improving let me know.  Follow up plan: Return in about 6 months (around 06/19/2018). Assunta Found, MD [facility]

## 2017-12-20 NOTE — Patient Instructions (Signed)
Back Exercises If you have pain in your back, do these exercises 2-3 times each day or as told by your doctor. When the pain goes away, do the exercises once each day, but repeat the steps more times for each exercise (do more repetitions). If you do not have pain in your back, do these exercises once each day or as told by your doctor. Exercises Single Knee to Chest  Do these steps 3-5 times in a row for each leg: 1. Lie on your back on a firm bed or the floor with your legs stretched out. 2. Bring one knee to your chest. 3. Hold your knee to your chest by grabbing your knee or thigh. 4. Pull on your knee until you feel a gentle stretch in your lower back. 5. Keep doing the stretch for 10-30 seconds. 6. Slowly let go of your leg and straighten it.  Pelvic Tilt  Do these steps 5-10 times in a row: 1. Lie on your back on a firm bed or the floor with your legs stretched out. 2. Bend your knees so they point up to the ceiling. Your feet should be flat on the floor. 3. Tighten your lower belly (abdomen) muscles to press your lower back against the floor. This will make your tailbone point up to the ceiling instead of pointing down to your feet or the floor. 4. Stay in this position for 5-10 seconds while you gently tighten your muscles and breathe evenly.  Cat-Cow  Do these steps until your lower back bends more easily: 1. Get on your hands and knees on a firm surface. Keep your hands under your shoulders, and keep your knees under your hips. You may put padding under your knees. 2. Let your head hang down, and make your tailbone point down to the floor so your lower back is round like the back of a cat. 3. Stay in this position for 5 seconds. 4. Slowly lift your head and make your tailbone point up to the ceiling so your back hangs low (sags) like the back of a cow. 5. Stay in this position for 5 seconds.  Press-Ups  Do these steps 5-10 times in a row: 1. Lie on your belly (face-down)  on the floor. 2. Place your hands near your head, about shoulder-width apart. 3. While you keep your back relaxed and keep your hips on the floor, slowly straighten your arms to raise the top half of your body and lift your shoulders. Do not use your back muscles. To make yourself more comfortable, you may change where you place your hands. 4. Stay in this position for 5 seconds. 5. Slowly return to lying flat on the floor.  Bridges  Do these steps 10 times in a row: 1. Lie on your back on a firm surface. 2. Bend your knees so they point up to the ceiling. Your feet should be flat on the floor. 3. Tighten your butt muscles and lift your butt off of the floor until your waist is almost as high as your knees. If you do not feel the muscles working in your butt and the back of your thighs, slide your feet 1-2 inches farther away from your butt. 4. Stay in this position for 3-5 seconds. 5. Slowly lower your butt to the floor, and let your butt muscles relax.  If this exercise is too easy, try doing it with your arms crossed over your chest. Back Lifts Do these steps 5-10 times in a   row: 1. Lie on your belly (face-down) with your arms at your sides, and rest your forehead on the floor. 2. Tighten the muscles in your legs and your butt. 3. Slowly lift your chest off of the floor while you keep your hips on the floor. Keep the back of your head in line with the curve in your back. Look at the floor while you do this. 4. Stay in this position for 3-5 seconds. 5. Slowly lower your chest and your face to the floor.  Contact a doctor if:  Your back pain gets a lot worse when you do an exercise.  Your back pain does not lessen 2 hours after you exercise. If you have any of these problems, stop doing the exercises. Do not do them again unless your doctor says it is okay. Get help right away if:  You have sudden, very bad back pain. If this happens, stop doing the exercises. Do not do them again  unless your doctor says it is okay. This information is not intended to replace advice given to you by your health care provider. Make sure you discuss any questions you have with your health care provider. Document Released: 11/28/2010 Document Revised: 04/02/2016 Document Reviewed: 12/20/2014 Elsevier Interactive Patient Education  2018 Elsevier Inc.   

## 2017-12-21 LAB — MICROALBUMIN / CREATININE URINE RATIO
Creatinine, Urine: 118.6 mg/dL
Microalb/Creat Ratio: 7.3 mg/g creat (ref 0.0–30.0)
Microalbumin, Urine: 8.7 ug/mL

## 2017-12-23 ENCOUNTER — Encounter: Payer: Self-pay | Admitting: Pediatrics

## 2018-01-19 DIAGNOSIS — M5137 Other intervertebral disc degeneration, lumbosacral region: Secondary | ICD-10-CM | POA: Diagnosis not present

## 2018-01-19 DIAGNOSIS — M9905 Segmental and somatic dysfunction of pelvic region: Secondary | ICD-10-CM | POA: Diagnosis not present

## 2018-01-20 DIAGNOSIS — M5137 Other intervertebral disc degeneration, lumbosacral region: Secondary | ICD-10-CM | POA: Diagnosis not present

## 2018-01-20 DIAGNOSIS — M9905 Segmental and somatic dysfunction of pelvic region: Secondary | ICD-10-CM | POA: Diagnosis not present

## 2018-01-24 DIAGNOSIS — M5137 Other intervertebral disc degeneration, lumbosacral region: Secondary | ICD-10-CM | POA: Diagnosis not present

## 2018-01-24 DIAGNOSIS — M9905 Segmental and somatic dysfunction of pelvic region: Secondary | ICD-10-CM | POA: Diagnosis not present

## 2018-01-26 DIAGNOSIS — M9905 Segmental and somatic dysfunction of pelvic region: Secondary | ICD-10-CM | POA: Diagnosis not present

## 2018-01-26 DIAGNOSIS — M5137 Other intervertebral disc degeneration, lumbosacral region: Secondary | ICD-10-CM | POA: Diagnosis not present

## 2018-01-27 DIAGNOSIS — M9905 Segmental and somatic dysfunction of pelvic region: Secondary | ICD-10-CM | POA: Diagnosis not present

## 2018-01-27 DIAGNOSIS — M5137 Other intervertebral disc degeneration, lumbosacral region: Secondary | ICD-10-CM | POA: Diagnosis not present

## 2018-01-31 ENCOUNTER — Ambulatory Visit (INDEPENDENT_AMBULATORY_CARE_PROVIDER_SITE_OTHER): Payer: Medicare Other | Admitting: *Deleted

## 2018-01-31 ENCOUNTER — Encounter: Payer: Self-pay | Admitting: *Deleted

## 2018-01-31 VITALS — BP 134/73 | HR 80 | Ht 61.5 in | Wt 174.0 lb

## 2018-01-31 DIAGNOSIS — Z780 Asymptomatic menopausal state: Secondary | ICD-10-CM

## 2018-01-31 DIAGNOSIS — M5137 Other intervertebral disc degeneration, lumbosacral region: Secondary | ICD-10-CM | POA: Diagnosis not present

## 2018-01-31 DIAGNOSIS — Z0000 Encounter for general adult medical examination without abnormal findings: Secondary | ICD-10-CM | POA: Diagnosis not present

## 2018-01-31 DIAGNOSIS — M9905 Segmental and somatic dysfunction of pelvic region: Secondary | ICD-10-CM | POA: Diagnosis not present

## 2018-01-31 NOTE — Patient Instructions (Signed)
  Ms. Fifer , Thank you for taking time to come for your Medicare Wellness Visit. I appreciate your ongoing commitment to your health goals. Please review the following plan we discussed and let me know if I can assist you in the future.   These are the goals we discussed: Goals    . Exercise 150 min/wk Moderate Activity       This is a list of the screening recommended for you and due dates:  Health Maintenance  Topic Date Due  . Eye exam for diabetics  01/30/1954  . DEXA scan (bone density measurement)  01/21/2018  . Colon Cancer Screening  09/14/2018*  . Hemoglobin A1C  06/19/2018  . Stool Blood Test  07/26/2018  . Complete foot exam   12/20/2018  . Urine Protein Check  12/20/2018  . Mammogram  04/08/2019  . Tetanus Vaccine  06/09/2021  . Flu Shot  Completed  .  Hepatitis C: One time screening is recommended by Center for Disease Control  (CDC) for  adults born from [birth year] through 1967.   Completed  . Pneumonia vaccines  Completed  *Topic was postponed. The date shown is not the original due date.

## 2018-01-31 NOTE — Progress Notes (Addendum)
Subjective:   Vanessa Hubbard is a 72 y.o. female who presents for a subsequent Medicare Annual Wellness Visit. 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 She is married and lives at home with her husband. She has three children, 2 adult daughters and one adult son. She has 2 grandchildren and 2 step grandchildren.   Review of Systems    Her health is about the same as last year.   Musc: lower back pain that is improving. She is seeing a Restaurant manager, fast food and has seen improvement since starting that.   Cardiac Risk Factors include: advanced age (>74men, >60 women);dyslipidemia;family history of premature cardiovascular disease;hypertension;diabetes mellitus;sedentary lifestyle;obesity (BMI >30kg/m2)  Other systems negative    Objective:    Today's Vitals   01/31/18 0842 01/31/18 0844  BP: 134/73   Pulse: 80   Weight: 174 lb (78.9 kg)   Height: 5' 1.5" (1.562 m)   PainSc:  4    Body mass index is 32.34 kg/m.  Advanced Directives 01/31/2018 01/27/2017 01/22/2016 10/18/2014 01/22/2012 01/22/2012  Does Patient Have a Medical Advance Directive? No No No No Patient does not have advance directive Patient would not like information  Would patient like information on creating a medical advance directive? No - Patient declined Yes (MAU/Ambulatory/Procedural Areas - Information given) Yes - Educational materials given Yes - Educational materials given - -  Pre-existing out of facility DNR order (yellow form or pink MOST form) - - - - - No    Current Medications (verified) Outpatient Encounter Medications as of 01/31/2018  Medication Sig  . aspirin 81 MG tablet Take 81 mg by mouth every other day.   . B-D ULTRA-FINE 33 LANCETS MISC Use to check BG daily (Dx 11.9 type 2 DM)  . cetirizine (ZYRTEC) 10 MG tablet Take 1 tablet (10 mg total) by mouth daily as needed. Allergies.  . Cholecalciferol (VITAMIN D3) 2000 UNITS TABS Take 1 tablet by mouth.    . fluticasone (FLONASE) 50 MCG/ACT nasal spray Place 2 sprays into both nostrils daily.  Marland Kitchen glucose blood (BAYER CONTOUR NEXT TEST) test strip Use to check BG once a day. Dx 11.9 - type 2 diabetes  . hydrochlorothiazide (HYDRODIURIL) 25 MG tablet Take 1 tablet (25 mg total) by mouth daily.  Marland Kitchen levothyroxine (SYNTHROID, LEVOTHROID) 100 MCG tablet Take 1 tablet (100 mcg total) by mouth daily.  . metFORMIN (GLUCOPHAGE) 500 MG tablet Take 1 tablet (500 mg total) by mouth 2 (two) times daily with a meal.  . Multiple Vitamin (MULTIVITAMIN) tablet Take 1 tablet by mouth daily.  . rosuvastatin (CRESTOR) 10 MG tablet Take 0.5 tablets (5 mg total) by mouth daily.   No facility-administered encounter medications on file as of 01/31/2018.     Allergies (verified) Patient has no known allergies.   History: Past Medical History:  Diagnosis Date  . Broken ankle left  . Diabetes mellitus without complication ([place])   . Fibroids   . History of fractured kneecap   . Hyperlipidemia   . Hypertension   . Osteopenia   . Thyroid disease    Past Surgical History:  Procedure Laterality Date  . ABDOMINAL HYSTERECTOMY     complete   . ANKLE SURGERY    . BREAST LUMPECTOMY Right    benign  . CHOLECYSTECTOMY    . KNEE SURGERY Right   . ORIF PATELLA  01/22/2012   Procedure: OPEN REDUCTION INTERNAL (ORIF) FIXATION PATELLA;  Surgeon: Tonna Corner  Marlou Sa, MD;  Location: [facility];  Service: Orthopedics;  Laterality: Left;  . TONSILLECTOMY     Family History  Problem Relation Age of Onset  . Jaundice Mother   . Cancer Mother        breast  . COPD Father   . Heart disease Father        triple bypass  . Heart failure Father   . Diabetes Father   . Thyroid disease Daughter   . Allergies Daughter   . Alcohol abuse Son   . Congenital heart disease Daughter    Social History   Socioeconomic History  . Marital status: Married    Spouse name: Not on file  . Number of children: 3  . Years of education: GED   . Highest education level: GED or equivalent  Occupational History  . 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 Financial resource strain: Not hard at all  . Food insecurity:    Worry: Never true    Inability: Never true  . Transportation needs:    Medical: No    Non-medical: No  Tobacco Use  . Smoking status: Never Smoker  . Smokeless tobacco: Never Used  Substance and Sexual Activity  . Alcohol use: No  . Drug use: No  . Sexual activity: Never  Lifestyle  . Physical activity:    Days per week: 0 days    Minutes per session: 0 min  . Stress: Not at all  Relationships  . Social connections:    Talks on phone: More than three times a week    Gets together: More than three times a week    Attends religious service: More than 4 times per year    Active member of club or organization: Yes    Attends meetings of clubs or organizations: More than 4 times per year    Relationship status: Married  Other Topics Concern  . Not on file  Social History Narrative  . Not on file    Tobacco Counseling No tobacco use  Clinical Intake:     Pain : 0-10 Pain Score: 4  Pain Type: Chronic pain Pain Location: Back Pain Orientation: Lower Pain Descriptors / Indicators: Aching Pain Onset: More than a month ago Pain Frequency: Intermittent Pain Relieving Factors: chiropractor Effect of Pain on Daily Activities: minimal  Pain Relieving Factors: chiropractor  Nutritional Status: BMI > 30  Obese Diabetes: No  How often do you need to have someone help you when you read instructions, pamphlets, or other written materials from your doctor or pharmacy?: 1 - Never What is the last grade level you completed in school?: GED  Interpreter Needed?: No  Information entered by :: Chong Sicilian, RN   Activities of Daily Living In your present state of health, do you have any difficulty performing the following activities: 01/31/2018   Hearing? N  Vision? N  Comment [facility] last June 2018  Difficulty concentrating or making decisions? N  Walking or climbing stairs? N  Dressing or bathing? N  Doing errands, shopping? N  Preparing Food and eating ? N  Using the Toilet? N  In the past six months, have you accidently leaked urine? N  Do you have problems with loss of bowel control? N  Managing your Medications? N  Managing your Finances? N  Housekeeping or managing your Housekeeping? N  Some recent data might be hidden  Immunizations and Health Maintenance Immunization History  Administered Date(s) Administered  . Influenza, High Dose Seasonal PF 09/08/2017  . Influenza,inj,Quad PF,6+ Mos 09/08/2013, 08/30/2014, 08/27/2015, 09/02/2016  . Pneumococcal Conjugate-13 08/28/2015  . Pneumococcal Polysaccharide-23 06/10/2011  . Zoster 01/03/2014   Health Maintenance Due  Topic Date Due  . OPHTHALMOLOGY EXAM  01/30/1954  . DEXA SCAN  01/21/2018    Patient Care Team: Eustaquio Maize, MD as PCP - General (Pediatrics) Melina Schools, OD (Optometry) Clarene Essex, MD as Consulting Physician (Gastroenterology) Michel Harrow, chiropractor      Assessment:   This is a routine wellness examination for [place].  Hearing/Vision screen No deficits noted during visit.   Dietary issues and exercise activities discussed:  Diet Usually eats two meals a day and a snack at lunch. Supper is typically around 4:00 or 4:30.   Current Exercise Habits: The patient does not participate in regular exercise at present  Goals    . Exercise 150 min/wk Moderate Activity      Depression Screen PHQ 2/9 Scores 01/31/2018 12/20/2017 06/17/2017 01/27/2017 12/18/2016 10/23/2016 10/09/2016  PHQ - 2 Score 0 0 0 0 0 0 0    Fall Risk Fall Risk  01/31/2018 12/20/2017 06/17/2017 01/27/2017 12/18/2016  Falls in the past year? No No No No No  Number falls in past yr: - - - - -  Injury with Fall? - - - - -    Is the patient's home free of  loose throw rugs in walkways, pet beds, electrical cords, etc?   yes      Grab bars in the bathroom? no      Handrails on the stairs?   no      Adequate lighting?   yes   Cognitive Function: MMSE - Mini Mental State Exam 01/31/2018 01/27/2017 01/22/2016  Orientation to time 5 5 5   Orientation to Place 5 5 5   Registration 3 3 3   Attention/ Calculation 5 5 4   Recall 3 3 3   Language- name 2 objects 2 2 2   Language- repeat 1 1 1   Language- follow 3 step command 3 3 3   Language- read & follow direction 1 1 1   Write a sentence 1 1 1   Copy design 1 1 1   Total score 30 30 29     Normal exam    Screening Tests Health Maintenance  Topic Date Due  . OPHTHALMOLOGY EXAM  01/30/1954  . DEXA SCAN  01/21/2018  . COLONOSCOPY  09/14/2018 (Originally 08/09/2016)  . HEMOGLOBIN A1C  06/19/2018  . COLON CANCER SCREENING ANNUAL FOBT  07/26/2018  . FOOT EXAM  12/20/2018  . URINE MICROALBUMIN  12/20/2018  . MAMMOGRAM  04/08/2019  . TETANUS/TDAP  06/09/2021  . INFLUENZA VACCINE  Completed  . Hepatitis C Screening  Completed  . PNA vac Low Risk Adult  Completed     Plan:  Colon cancer screenig due in Sept. Agreed to cologuard. Will need order at next visit.  Mammogram scheduled Dexa ordered for next visit with PCP. Appt note made.  Keep appt with PCP in August.  Eye exam due in June 2019. Have them send a report to our office.  Increase activity level. Aim for 150 minutes of moderate activity a week.    I have personally reviewed and noted the following in the patient's chart:   . Medical and social history . Use of alcohol, tobacco or illicit drugs  . Current medications and supplements . Functional ability and status . Nutritional status .  Physical activity . Advanced directives . List of other physicians . Hospitalizations, surgeries, and ER visits in previous 12 months . Vitals . Screenings to include cognitive, depression, and falls . Referrals and appointments  In addition, I  have reviewed and discussed with patient certain preventive protocols, quality metrics, and best practice recommendations. A written personalized care plan for preventive services as well as general preventive health recommendations were provided to patient.     Chong Sicilian, RN   01/31/2018    I have reviewed and agree with the above AWV documentation.   Assunta Found, MD [facility] 02/02/2018, 8:15 AM

## 2018-02-02 DIAGNOSIS — M5137 Other intervertebral disc degeneration, lumbosacral region: Secondary | ICD-10-CM | POA: Diagnosis not present

## 2018-02-02 DIAGNOSIS — M9905 Segmental and somatic dysfunction of pelvic region: Secondary | ICD-10-CM | POA: Diagnosis not present

## 2018-02-03 DIAGNOSIS — M5137 Other intervertebral disc degeneration, lumbosacral region: Secondary | ICD-10-CM | POA: Diagnosis not present

## 2018-02-03 DIAGNOSIS — M9905 Segmental and somatic dysfunction of pelvic region: Secondary | ICD-10-CM | POA: Diagnosis not present

## 2018-02-07 DIAGNOSIS — M5137 Other intervertebral disc degeneration, lumbosacral region: Secondary | ICD-10-CM | POA: Diagnosis not present

## 2018-02-07 DIAGNOSIS — M9905 Segmental and somatic dysfunction of pelvic region: Secondary | ICD-10-CM | POA: Diagnosis not present

## 2018-02-09 DIAGNOSIS — M5137 Other intervertebral disc degeneration, lumbosacral region: Secondary | ICD-10-CM | POA: Diagnosis not present

## 2018-02-09 DIAGNOSIS — M9905 Segmental and somatic dysfunction of pelvic region: Secondary | ICD-10-CM | POA: Diagnosis not present

## 2018-02-21 NOTE — Addendum Note (Signed)
Addended by: Ilean China on: 02/21/2018 03:09 PM   Modules accepted: Level of Service

## 2018-02-23 ENCOUNTER — Encounter: Payer: Self-pay | Admitting: Pediatrics

## 2018-02-23 ENCOUNTER — Other Ambulatory Visit: Payer: Self-pay | Admitting: Pediatrics

## 2018-02-23 DIAGNOSIS — M25551 Pain in right hip: Secondary | ICD-10-CM

## 2018-03-04 ENCOUNTER — Other Ambulatory Visit: Payer: Self-pay | Admitting: Pediatrics

## 2018-03-04 DIAGNOSIS — I10 Essential (primary) hypertension: Secondary | ICD-10-CM

## 2018-03-15 ENCOUNTER — Other Ambulatory Visit: Payer: Self-pay | Admitting: Pediatrics

## 2018-03-15 DIAGNOSIS — E785 Hyperlipidemia, unspecified: Secondary | ICD-10-CM

## 2018-03-18 NOTE — Telephone Encounter (Signed)
Declines referral at this time

## 2018-05-02 DIAGNOSIS — Z1231 Encounter for screening mammogram for malignant neoplasm of breast: Secondary | ICD-10-CM | POA: Diagnosis not present

## 2018-05-02 LAB — HM MAMMOGRAPHY

## 2018-06-08 ENCOUNTER — Other Ambulatory Visit: Payer: Self-pay | Admitting: Pediatrics

## 2018-06-08 DIAGNOSIS — E039 Hypothyroidism, unspecified: Secondary | ICD-10-CM

## 2018-06-08 NOTE — Telephone Encounter (Signed)
Last thyroid 06/17/17

## 2018-06-12 ENCOUNTER — Other Ambulatory Visit: Payer: Self-pay | Admitting: Family Medicine

## 2018-06-12 DIAGNOSIS — E785 Hyperlipidemia, unspecified: Secondary | ICD-10-CM

## 2018-06-13 NOTE — Progress Notes (Signed)
In Care Everywhere  

## 2018-06-13 NOTE — Telephone Encounter (Signed)
Ov 06/20/18

## 2018-06-20 ENCOUNTER — Ambulatory Visit (INDEPENDENT_AMBULATORY_CARE_PROVIDER_SITE_OTHER): Payer: Medicare Other | Admitting: Pediatrics

## 2018-06-20 ENCOUNTER — Ambulatory Visit (INDEPENDENT_AMBULATORY_CARE_PROVIDER_SITE_OTHER): Payer: Medicare Other

## 2018-06-20 ENCOUNTER — Encounter: Payer: Self-pay | Admitting: Pediatrics

## 2018-06-20 VITALS — BP 110/63 | HR 71 | Temp 97.5°F | Ht 61.5 in | Wt 171.2 lb

## 2018-06-20 DIAGNOSIS — Z780 Asymptomatic menopausal state: Secondary | ICD-10-CM | POA: Diagnosis not present

## 2018-06-20 DIAGNOSIS — Z1211 Encounter for screening for malignant neoplasm of colon: Secondary | ICD-10-CM

## 2018-06-20 DIAGNOSIS — I10 Essential (primary) hypertension: Secondary | ICD-10-CM

## 2018-06-20 DIAGNOSIS — E785 Hyperlipidemia, unspecified: Secondary | ICD-10-CM | POA: Diagnosis not present

## 2018-06-20 DIAGNOSIS — E039 Hypothyroidism, unspecified: Secondary | ICD-10-CM | POA: Diagnosis not present

## 2018-06-20 DIAGNOSIS — E119 Type 2 diabetes mellitus without complications: Secondary | ICD-10-CM

## 2018-06-20 DIAGNOSIS — M85852 Other specified disorders of bone density and structure, left thigh: Secondary | ICD-10-CM | POA: Diagnosis not present

## 2018-06-20 MED ORDER — ROSUVASTATIN CALCIUM 10 MG PO TABS: 5.0000 mg | ORAL_TABLET | Freq: Every day | ORAL | 1 refills | Status: DC

## 2018-06-20 MED ORDER — LEVOTHYROXINE SODIUM 100 MCG PO TABS: 100.0000 ug | ORAL_TABLET | Freq: Every day | ORAL | 1 refills | Status: DC

## 2018-06-20 MED ORDER — METFORMIN HCL 500 MG PO TABS: 500.0000 mg | ORAL_TABLET | Freq: Two times a day (BID) | ORAL | 1 refills | Status: DC

## 2018-06-20 MED ORDER — HYDROCHLOROTHIAZIDE 25 MG PO TABS: 25.0000 mg | ORAL_TABLET | Freq: Every day | ORAL | 1 refills | Status: DC

## 2018-06-20 NOTE — Progress Notes (Signed)
  Subjective:   Patient ID: Vanessa Hubbard, female    DOB: July 31, 1946, 72 y.o.   MRN: [id number] CC: Medical Management of Chronic Issues  HPI: Vanessa Hubbard is a 72 y.o. female   Hypothyroidism: Taking medicines regularly, no side effects  Hypertension: No lightheadedness or dizziness, no chest pain or shortness of breath  Diabetes: Trying to avoid sugary foods.  On metformin alone.  Hyperlipidemia: Taking statin regularly, no side effects  Relevant past medical, surgical, family and social history reviewed. Allergies and medications reviewed and updated. Social History   Tobacco Use  Smoking Status Never Smoker  Smokeless Tobacco Never Used   ROS: Per HPI   Objective:    BP 110/63   Pulse 71   Temp (!) 97.5 F (36.4 C) (Oral)   Ht 5' 1.5" (1.562 m)   Wt 171 lb 3.2 oz (77.7 kg)   BMI 31.82 kg/m   Wt Readings from Last 3 Encounters:  06/20/18 171 lb 3.2 oz (77.7 kg)  01/31/18 174 lb (78.9 kg)  12/20/17 170 lb 6.4 oz (77.3 kg)    Gen: NAD, alert, cooperative with exam, NCAT EYES: EOMI, no conjunctival injection, or no icterus ENT:  TMs pearly gray b/l, OP without erythema LYMPH: no cervical LAD NECK: nl thyroid CV: NRRR, normal S1/S2, no murmur, distal pulses 2+ b/l Resp: CTABL, no wheezes, normal WOB Ext: No edema, warm Neuro: Alert and oriented, strength equal b/l UE and LE, coordination grossly normal  Assessment & Plan:  Vanessa Hubbard was seen today for medical management of chronic issues.  Diagnoses and all orders for this visit:  Hypothyroidism, unspecified type Stable, continue below medicines -     levothyroxine (SYNTHROID, LEVOTHROID) 100 MCG tablet; Take 1 tablet (100 mcg total) by mouth daily. -     TSH  Hyperlipidemia with target LDL less than 100 Stable, continue below medicines -     rosuvastatin (CRESTOR) 10 MG tablet; Take 0.5 tablets (5 mg total) by mouth daily.  Essential hypertension Well-controlled, continue below -      hydrochlorothiazide (HYDRODIURIL) 25 MG tablet; Take 1 tablet (25 mg total) by mouth daily. -     BMP8+EGFR  Type 2 diabetes mellitus without complication, without long-term current use of insulin ([facility]) We will recheck A1c, has been well controlled, on metformin and diet changes alone. -     metFORMIN (GLUCOPHAGE) 500 MG tablet; Take 1 tablet (500 mg total) by mouth 2 (two) times daily with a meal. -     Bayer DCA Hb A1c Waived  Screening for colon cancer -     Cologuard   Follow up plan: Return in about 6 months (around 12/21/2018). Assunta Found, MD [place]

## 2018-06-21 LAB — BMP8+EGFR
BUN / CREAT RATIO: 16 (ref 12–28)
BUN: 11 mg/dL (ref 8–27)
CHLORIDE: 102 mmol/L (ref 96–106)
CO2: 27 mmol/L (ref 20–29)
Calcium: 9.8 mg/dL (ref 8.7–10.3)
Creatinine, Ser: 0.68 mg/dL (ref 0.57–1.00)
GFR calc non Af Amer: 86 mL/min/{1.73_m2} (ref >59)
GFR, EST AFRICAN AMERICAN: 100 mL/min/{1.73_m2} (ref >59)
Glucose: 106 mg/dL — ABNORMAL HIGH (ref 65–99)
POTASSIUM: 4.1 mmol/L (ref 3.5–5.2)
SODIUM: 143 mmol/L (ref 134–144)

## 2018-06-21 LAB — BAYER DCA HB A1C WAIVED: HB A1C: 5.7 % (ref <7.0)

## 2018-06-21 LAB — TSH: TSH: 0.735 u[IU]/mL (ref 0.450–4.500)

## 2018-07-19 DIAGNOSIS — Z1211 Encounter for screening for malignant neoplasm of colon: Secondary | ICD-10-CM | POA: Diagnosis not present

## 2018-07-27 LAB — COLOGUARD: Cologuard: NEGATIVE

## 2018-08-01 DIAGNOSIS — H524 Presbyopia: Secondary | ICD-10-CM | POA: Diagnosis not present

## 2018-08-30 ENCOUNTER — Other Ambulatory Visit: Payer: Self-pay | Admitting: Pediatrics

## 2018-08-30 DIAGNOSIS — E039 Hypothyroidism, unspecified: Secondary | ICD-10-CM

## 2018-09-05 ENCOUNTER — Ambulatory Visit (INDEPENDENT_AMBULATORY_CARE_PROVIDER_SITE_OTHER): Payer: Medicare Other

## 2018-09-05 DIAGNOSIS — Z23 Encounter for immunization: Secondary | ICD-10-CM

## 2018-12-21 ENCOUNTER — Ambulatory Visit: Payer: Medicare Other | Admitting: Pediatrics

## 2018-12-23 ENCOUNTER — Ambulatory Visit (INDEPENDENT_AMBULATORY_CARE_PROVIDER_SITE_OTHER): Payer: Medicare Other | Admitting: Family Medicine

## 2018-12-23 VITALS — BP 138/72 | HR 77 | Temp 98.4°F | Ht 60.0 in | Wt 171.0 lb

## 2018-12-23 DIAGNOSIS — E039 Hypothyroidism, unspecified: Secondary | ICD-10-CM | POA: Diagnosis not present

## 2018-12-23 DIAGNOSIS — I10 Essential (primary) hypertension: Secondary | ICD-10-CM

## 2018-12-23 DIAGNOSIS — I152 Hypertension secondary to endocrine disorders: Secondary | ICD-10-CM

## 2018-12-23 DIAGNOSIS — E1169 Type 2 diabetes mellitus with other specified complication: Secondary | ICD-10-CM

## 2018-12-23 DIAGNOSIS — E1159 Type 2 diabetes mellitus with other circulatory complications: Secondary | ICD-10-CM

## 2018-12-23 DIAGNOSIS — L089 Local infection of the skin and subcutaneous tissue, unspecified: Secondary | ICD-10-CM

## 2018-12-23 DIAGNOSIS — L723 Sebaceous cyst: Secondary | ICD-10-CM

## 2018-12-23 DIAGNOSIS — E785 Hyperlipidemia, unspecified: Secondary | ICD-10-CM

## 2018-12-23 DIAGNOSIS — E119 Type 2 diabetes mellitus without complications: Secondary | ICD-10-CM | POA: Diagnosis not present

## 2018-12-23 LAB — BAYER DCA HB A1C WAIVED: HB A1C (BAYER DCA - WAIVED): 5.6 % (ref <7.0)

## 2018-12-23 MED ORDER — METFORMIN HCL 500 MG PO TABS: 500.0000 mg | ORAL_TABLET | Freq: Two times a day (BID) | ORAL | 1 refills | Status: DC

## 2018-12-23 MED ORDER — DOXYCYCLINE HYCLATE 100 MG PO TABS: 100.0000 mg | ORAL_TABLET | Freq: Two times a day (BID) | ORAL | 0 refills | Status: DC

## 2018-12-23 MED ORDER — HYDROCHLOROTHIAZIDE 25 MG PO TABS: 25.0000 mg | ORAL_TABLET | Freq: Every day | ORAL | 1 refills | Status: DC

## 2018-12-23 MED ORDER — ROSUVASTATIN CALCIUM 10 MG PO TABS: 5.0000 mg | ORAL_TABLET | Freq: Every day | ORAL | 1 refills | Status: DC

## 2018-12-23 NOTE — Progress Notes (Signed)
Subjective: CC: DM2,HTN, HLD PCP: [name], DO IRC:VELFYB Vanessa Hubbard is a 73 y.o. female presenting to clinic today for:  1. Type 2 Diabetes w/ HTN, HLD:  Patient reports compliance w. Metformin, Crestor 10 mg daily and hydrochlorothiazide 25 mg daily.  Side effects: none.  Last eye exam: <1 year ago.  [facility] Last foot exam: >1 year Last A1c:  Lab Results  Component Value Date   HGBA1C 5.7 06/20/2018   Nephropathy screen indicated?: Yes. Last 12/2017. Last flu, zoster and/or pneumovax:  Immunization History  Administered Date(s) Administered  . Influenza, High Dose Seasonal PF 09/08/2017, 09/05/2018  . Influenza,inj,Quad PF,6+ Mos 09/08/2013, 08/30/2014, 08/27/2015, 09/02/2016  . Pneumococcal Conjugate-13 08/28/2015  . Pneumococcal Polysaccharide-23 06/10/2011  . Zoster 01/03/2014   ROS: Denies unintended weight loss/gain, foot ulcerations, numbness or tingling in extremities or chest pain.  2.  Hypothyroidism Patient reports longstanding history of hypothyroidism.  Denies any radiation or surgery to the neck.  She initially was found to be hypothyroid when she had intense fatigue.  She denies any fatigue, weight gain or loss, diarrhea, constipation or tremor.  She reports compliance with Synthroid and does not need refills at this time.  3.  Sebaceous cyst Patient reports that Dr. Evette Doffing had been following her sebaceous cyst on her back.  She says around December, it started draining.  She denies any significant pain but does note that her husband stated that it had some surrounding erythema.  Drainage does appear purulent.  She has been using warm compresses and occasional Neosporin to the area but discontinued Neosporin a while ago.  ROS: Per HPI  No Known Allergies Past Medical History:  Diagnosis Date  . Broken ankle left  . Diabetes mellitus without complication ([place])   . Fibroids   . History of fractured kneecap   .  Hyperlipidemia   . Hypertension   . Osteopenia   . Thyroid disease     Current Outpatient Medications:  .  aspirin 81 MG tablet, Take 81 mg by mouth every other day. , Disp: , Rfl:  .  Vanessa-D ULTRA-FINE 33 LANCETS MISC, Use to check BG daily (Dx 11.9 type 2 DM), Disp: 100 each, Rfl: 1 .  cetirizine (ZYRTEC) 10 MG tablet, Take 1 tablet (10 mg total) by mouth daily as needed. Allergies., Disp: 90 tablet, Rfl: 1 .  Cholecalciferol (VITAMIN D3) 2000 UNITS TABS, Take 1 tablet by mouth., Disp: , Rfl:  .  fluticasone (FLONASE) 50 MCG/ACT nasal spray, Place 2 sprays into both nostrils daily., Disp: 16 g, Rfl: 6 .  glucose blood (BAYER CONTOUR NEXT TEST) test strip, Use to check BG once a day. Dx 11.9 - type 2 diabetes, Disp: 50 each, Rfl: 2 .  hydrochlorothiazide (HYDRODIURIL) 25 MG tablet, Take 1 tablet (25 mg total) by mouth daily., Disp: 90 tablet, Rfl: 1 .  levothyroxine (SYNTHROID, LEVOTHROID) 100 MCG tablet, TAKE 1 TABLET BY MOUTH EVERY DAY, Disp: 90 tablet, Rfl: 2 .  metFORMIN (GLUCOPHAGE) 500 MG tablet, Take 1 tablet (500 mg total) by mouth 2 (two) times daily with a meal., Disp: 180 tablet, Rfl: 1 .  Multiple Vitamin (MULTIVITAMIN) tablet, Take 1 tablet by mouth daily., Disp: , Rfl:  .  rosuvastatin (CRESTOR) 10 MG tablet, Take 0.5 tablets (5 mg total) by mouth daily., Disp: 45 tablet, Rfl: 1 Social History   Socioeconomic History  . Marital status: Married    Spouse name: Not on file  .  Number of children: 3  . Years of education: GED  . Highest education level: GED or equivalent  Occupational History  . 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 Financial resource strain: Not hard at all  . Food insecurity:    Worry: Never true    Inability: Never true  . Transportation needs:    Medical: No    Non-medical: No  Tobacco Use  . Smoking status: Never Smoker  . Smokeless tobacco: Never Used  Substance and Sexual Activity  .  Alcohol use: No  . Drug use: No  . Sexual activity: Never  Lifestyle  . Physical activity:    Days per week: 0 days    Minutes per session: 0 min  . Stress: Not at all  Relationships  . Social connections:    Talks on phone: More than three times a week    Gets together: More than three times a week    Attends religious service: More than 4 times per year    Active member of club or organization: Yes    Attends meetings of clubs or organizations: More than 4 times per year    Relationship status: Married  . Intimate partner violence:    Fear of current or ex partner: No    Emotionally abused: No    Physically abused: No    Forced sexual activity: No  Other Topics Concern  . Not on file  Social History Narrative  . Not on file   Family History  Problem Relation Age of Onset  . Jaundice Mother   . Cancer Mother        breast  . COPD Father   . Heart disease Father        triple bypass  . Heart failure Father   . Diabetes Father   . Thyroid disease Daughter   . Allergies Daughter   . Alcohol abuse Son   . Congenital heart disease Daughter     Objective: Office vital signs reviewed. BP 138/72   Pulse 77   Temp 98.4 F (36.9 C) (Oral)   Ht 5' (1.524 m)   Wt 171 lb (77.6 kg)   BMI 33.40 kg/m   Physical Examination:  General: Awake, alert, well nourished, No acute distress HEENT: Normal, sclera white, MMM, no goiter or exophthalmos. Cardio: regular rate and rhythm, S1S2 heard, no murmurs appreciated Pulm: clear to auscultation bilaterally, no wheezes, rhonchi or rales; normal work of breathing on room air Extremities: warm, well perfused, No edema, cyanosis or clubbing; +2 pulses bilaterally Skin: ~0.5" x 0.25" of induration with associated erythema along the left border.  She has 2 visible punctum, 1 which is actively draining purulent fluid.  No significant tenderness to palpation. Neuro: see DM foot below.  Diabetic Foot Exam - Simple   Simple Foot  Form Diabetic Foot exam was performed with the following findings:  Yes 12/23/2018  4:37 PM  Visual Inspection See comments:  Yes Sensation Testing Intact to touch and monofilament testing bilaterally:  Yes Pulse Check Posterior Tibialis and Dorsalis pulse intact bilaterally:  Yes Comments She has hammertoes on bilateral feet.  There is an appreciable callus between the third and fourth digits of the right foot on the plantar surface.  She also has a large callus on the lateral aspect of the left foot near the pinky toe.  No appreciable skin breakdown.    Assessment/ Plan: 73 y.o.  female   1. Controlled type 2 diabetes mellitus without complication, without long-term current use of insulin (HCC) Under excellent control with metformin.  Her A1c today was 5.6.  She will have her diabetic eye exam sent to the office.  Diabetic foot exam was performed and did demonstrate a few calluses with no evidence of secondary ulceration.  No history of peripheral neuropathy or diabetic ulcer.  Therefore no diabetic shoes were prescribed today. - Bayer DCA Hb A1c Waived - metFORMIN (GLUCOPHAGE) 500 MG tablet; Take 1 tablet (500 mg total) by mouth 2 (two) times daily with a meal.  Dispense: 180 tablet; Refill: 1  2. Hypertension associated with diabetes ([place]) Under excellent control with hydrochlorothiazide.  She is due for urine microalbumin and we will collect this at next visit. - hydrochlorothiazide (HYDRODIURIL) 25 MG tablet; Take 1 tablet (25 mg total) by mouth daily.  Dispense: 90 tablet; Refill: 1  3. Hyperlipidemia associated with type 2 diabetes mellitus (HCC) Crestor refilled. - rosuvastatin (CRESTOR) 10 MG tablet; Take 0.5 tablets (5 mg total) by mouth daily.  Dispense: 45 tablet; Refill: 1  4. Hypothyroidism, unspecified type Checking thyroid panel today.  She has refills of Synthroid through August - Thyroid Panel With TSH  5. Infected sebaceous cyst We discussed treatment.  It appears  to be draining adequately as there was no palpable fluctuance on exam.  Doxycycline p.o. twice daily prescribed.  Home care instructions reviewed including continuing warm compresses.  Reasons for return also discussed.  She will contact me in 1 week if she decides to proceed with surgical removal, at which time I will place referral to general surgery.   No orders of the defined types were placed in this encounter.  Meds ordered this encounter  Medications  . doxycycline (VIBRA-TABS) 100 MG tablet    Sig: Take 1 tablet (100 mg total) by mouth 2 (two) times daily.    Dispense:  20 tablet    Refill:  0  . hydrochlorothiazide (HYDRODIURIL) 25 MG tablet    Sig: Take 1 tablet (25 mg total) by mouth daily.    Dispense:  90 tablet    Refill:  1  . metFORMIN (GLUCOPHAGE) 500 MG tablet    Sig: Take 1 tablet (500 mg total) by mouth 2 (two) times daily with a meal.    Dispense:  180 tablet    Refill:  1    Please consider 90 day supplies to promote better adherence  . rosuvastatin (CRESTOR) 10 MG tablet    Sig: Take 0.5 tablets (5 mg total) by mouth daily.    Dispense:  45 tablet    Refill:  [place], [place] [phone]

## 2018-12-23 NOTE — Patient Instructions (Signed)
Have your diabetic eye exam report sent to me.   See me in August, sooner if needed.  Regarding your infected cyst, I have prescribed Doxycycline. Take 1 tablet 2 times daily with a meal.  Call me in 1 week if you decide you want to have the cyst removed and I will refer you to the specialist.   Epidermal Cyst  An epidermal cyst is a sac made of skin tissue. The sac contains a substance called keratin. Keratin is a protein that is normally secreted through the hair follicles. When keratin becomes trapped in the top layer of skin (epidermis), it can form an epidermal cyst. Epidermal cysts can be found anywhere on your body. These cysts are usually harmless (benign), and they may not cause symptoms unless they become infected. What are the causes? This condition may be caused by:  A blocked hair follicle.  A hair that curls and re-enters the skin instead of growing straight out of the skin (ingrown hair).  A blocked pore.  Irritated skin.  An injury to the skin.  Certain conditions that are passed along from parent to child (inherited).  Human papillomavirus (HPV).  Long-term (chronic) sun damage to the skin. What increases the risk? The following factors may make you more likely to develop an epidermal cyst:  Having acne.  Being overweight.  Being 47-40 years old. What are the signs or symptoms? The only symptom of this condition may be a small, painless lump underneath the skin. When an epidermal cyst ruptures, it may become infected. Symptoms may include:  Redness.  Inflammation.  Tenderness.  Warmth.  Fever.  Keratin draining from the cyst. Keratin is grayish-white, bad-smelling substance.  Pus draining from the cyst. How is this diagnosed? This condition is diagnosed with a physical exam.  In some cases, you may have a sample of tissue (biopsy) taken from your cyst to be examined under a microscope or tested for bacteria.  You may be referred to a health  care provider who specializes in skin care (dermatologist). How is this treated? In many cases, epidermal cysts go away on their own without treatment. If a cyst becomes infected, treatment may include:  Opening and draining the cyst, done by a health care provider. After draining, minor surgery to remove the rest of the cyst may be done.  Antibiotic medicine.  Injections of medicines (steroids) that help to reduce inflammation.  Surgery to remove the cyst. Surgery may be done if the cyst: ? Becomes large. ? Bothers you. ? Has a chance of turning into cancer.  Do not try to open a cyst yourself. Follow these instructions at home:  Take over-the-counter and prescription medicines only as told by your health care provider.  If you were prescribed an antibiotic medicine, take it it as told by your health care provider. Do not stop using the antibiotic even if you start to feel better.  Keep the area around your cyst clean and dry.  Wear loose, dry clothing.  Avoid touching your cyst.  Check your cyst every day for signs of infection. Check for: ? Redness, swelling, or pain. ? Fluid or blood. ? Warmth. ? Pus or a bad smell.  Keep all follow-up visits as told by your health care provider. This is important. How is this prevented?  Wear clean, dry, clothing.  Avoid wearing tight clothing.  Keep your skin clean and dry. Take showers or baths every day. Contact a health care provider if:  Your cyst develops  symptoms of infection.  Your condition is not improving or is getting worse.  You develop a cyst that looks different from other cysts you have had.  You have a fever. Get help right away if:  Redness spreads from the cyst into the surrounding area. Summary  An epidermal cyst is a sac made of skin tissue. These cysts are usually harmless (benign), and they may not cause symptoms unless they become infected.  If a cyst becomes infected, treatment may include  surgery to open and drain the cyst, or to remove it. Treatment may also include medicines by mouth or through an injection.  Take over-the-counter and prescription medicines only as told by your health care provider. If you were prescribed an antibiotic medicine, take it as told by your health care provider. Do not stop using the antibiotic even if you start to feel better.  Contact a health care provider if your condition is not improving or is getting worse.  Keep all follow-up visits as told by your health care provider. This is important. This information is not intended to replace advice given to you by your health care provider. Make sure you discuss any questions you have with your health care provider. Document Released: 09/26/2004 Document Revised: 05/09/2018 Document Reviewed: 05/09/2018 Elsevier Interactive Patient Education  2019 [employer].

## 2018-12-24 LAB — THYROID PANEL WITH TSH
Free Thyroxine Index: 2.3 (ref 1.2–4.9)
T3 UPTAKE RATIO: 25 % (ref 24–39)
T4, Total: 9.2 ug/dL (ref 4.5–12.0)
TSH: 0.942 u[IU]/mL (ref 0.450–4.500)

## 2019-02-07 ENCOUNTER — Encounter: Payer: Medicare Other | Admitting: *Deleted

## 2019-03-09 ENCOUNTER — Ambulatory Visit (INDEPENDENT_AMBULATORY_CARE_PROVIDER_SITE_OTHER): Payer: Medicare Other | Admitting: *Deleted

## 2019-03-09 ENCOUNTER — Other Ambulatory Visit: Payer: Self-pay

## 2019-03-09 DIAGNOSIS — Z0000 Encounter for general adult medical examination without abnormal findings: Secondary | ICD-10-CM

## 2019-03-09 NOTE — Patient Instructions (Signed)
Preventive Care 75 Years and Older, Female Preventive care refers to lifestyle choices and visits with your health care provider that can promote health and wellness. What does preventive care include?  A yearly physical exam. This is also called an annual well check.  Dental exams once or twice a year.  Routine eye exams. Ask your health care provider how often you should have your eyes checked.  Personal lifestyle choices, including: ? Daily care of your teeth and gums. ? Regular physical activity. ? Eating a healthy diet. ? Avoiding tobacco and drug use. ? Limiting alcohol use. ? Practicing safe sex. ? Taking low-dose aspirin every day. ? Taking vitamin and mineral supplements as recommended by your health care provider. What happens during an annual well check? The services and screenings done by your health care provider during your annual well check will depend on your age, overall health, lifestyle risk factors, and family history of disease. Counseling Your health care provider may ask you questions about your:  Alcohol use.  Tobacco use.  Drug use.  Emotional well-being.  Home and relationship well-being.  Sexual activity.  Eating habits.  History of falls.  Memory and ability to understand (cognition).  Work and work Statistician.  Reproductive health.  Screening You may have the following tests or measurements:  Height, weight, and BMI.  Blood pressure.  Lipid and cholesterol levels. These may be checked every 5 years, or more frequently if you are over 75 years old.  Skin check.  Lung cancer screening. You may have this screening every year starting at age 75 if you have a 30-pack-year history of smoking and currently smoke or have quit within the past 15 years.  Colorectal cancer screening. All adults should have this screening starting at age 75 and continuing until age 46. You will have tests every 1-10 years, depending on your results and the  type of screening test. People at increased risk should start screening at an earlier age. Screening tests may include: ? Guaiac-based fecal occult blood testing. ? Fecal immunochemical test (FIT). ? Stool DNA test. ? Virtual colonoscopy. ? Sigmoidoscopy. During this test, a flexible tube with a tiny camera (sigmoidoscope) is used to examine your rectum and lower colon. The sigmoidoscope is inserted through your anus into your rectum and lower colon. ? Colonoscopy. During this test, a long, thin, flexible tube with a tiny camera (colonoscope) is used to examine your entire colon and rectum.  Hepatitis C blood test.  Hepatitis B blood test.  Sexually transmitted disease (STD) testing.  Diabetes screening. This is done by checking your blood sugar (glucose) after you have not eaten for a while (fasting). You may have this done every 1-3 years.  Bone density scan. This is done to screen for osteoporosis. You may have this done starting at age 75.  Mammogram. This may be done every 1-2 years. Talk to your health care provider about how often you should have regular mammograms. Talk with your health care provider about your test results, treatment options, and if necessary, the need for more tests. Vaccines Your health care provider may recommend certain vaccines, such as:  Influenza vaccine. This is recommended every year.  Tetanus, diphtheria, and acellular pertussis (Tdap, Td) vaccine. You may need a Td booster every 10 years.  Varicella vaccine. You may need this if you have not been vaccinated.  Zoster vaccine. You may need this after age 75.  Measles, mumps, and rubella (MMR) vaccine. You may need at least  one dose of MMR if you were born in 1957 or later. You may also need a second dose.  Pneumococcal 13-valent conjugate (PCV13) vaccine. One dose is recommended after age 24.  Pneumococcal polysaccharide (PPSV23) vaccine. One dose is recommended after age 24.  Meningococcal  vaccine. You may need this if you have certain conditions.  Hepatitis A vaccine. You may need this if you have certain conditions or if you travel or work in places where you may be exposed to hepatitis A.  Hepatitis B vaccine. You may need this if you have certain conditions or if you travel or work in places where you may be exposed to hepatitis B.  Haemophilus influenzae type b (Hib) vaccine. You may need this if you have certain conditions. Talk to your health care provider about which screenings and vaccines you need and how often you need them. This information is not intended to replace advice given to you by your health care provider. Make sure you discuss any questions you have with your health care provider. Document Released: 11/22/2015 Document Revised: 12/16/2017 Document Reviewed: 08/27/2015 Elsevier Interactive Patient Education  2019 [employer].

## 2019-03-09 NOTE — Progress Notes (Addendum)
MEDICARE ANNUAL WELLNESS VISIT  03/09/2019  Telephone Visit Disclaimer This Medicare AWV was conducted by telephone due to national recommendations for restrictions regarding the COVID-19 Pandemic (e.g. social distancing).  I verified, using two identifiers, that I am speaking with [name] or their authorized healthcare agent. I discussed the limitations, risks, security, and privacy concerns of performing an evaluation and management service by telephone and the potential availability of an in-person appointment in the future. The patient expressed understanding and agreed to proceed.   Subjective:  Vanessa Hubbard is a 73 y.o. female patient of [name], DO who had a Medicare Annual Wellness Visit today via telephone. [name] is Retired and lives with their spouse. she has 3 children. she reports that she is socially active and does interact with friends/family regularly. she is moderately physically active and enjoys dancing.  Patient Care Team: [name], DO as PCP - General (Family Medicine) Melina Schools, OD (Optometry) Clarene Essex, MD as Consulting Physician (Gastroenterology)  Advanced Directives 03/09/2019 01/31/2018 01/27/2017 01/22/2016 10/18/2014 01/22/2012 01/22/2012  Does Patient Have a Medical Advance Directive? No No No No No Patient does not have advance directive Patient would not like information  Would patient like information on creating a medical advance directive? No - Patient declined No - Patient declined Yes (MAU/Ambulatory/Procedural Areas - Information given) Yes - Educational materials given Yes - Scientist, clinical (histocompatibility and immunogenetics) given - -  Pre-existing out of facility DNR order (yellow form or pink MOST form) - - - - - - No    Hospital Utilization Over the Past 12 Months: # of hospitalizations or ER visits: 0 # of surgeries: 0  Review of Systems    Patient reports that her overall health is better compared to last year.  Patient Reported  Readings (BP, Pulse, CBG, Weight, etc) none  Review of Systems: No complaints  All other systems negative.  Pain Assessment Pain : No/denies pain     Current Medications & Allergies (verified) Allergies as of 03/09/2019   No Known Allergies     Medication List       Accurate as of March 09, 2019  9:45 AM. Always use your most recent med list.        aspirin 81 MG tablet Take 81 mg by mouth every other day.   B-D ULTRA-FINE 33 LANCETS Misc Use to check BG daily (Dx 11.9 type 2 DM)   cetirizine 10 MG tablet Commonly known as:  ZYRTEC Take 1 tablet (10 mg total) by mouth daily as needed. Allergies.   fluticasone 50 MCG/ACT nasal spray Commonly known as:  FLONASE Place 2 sprays into both nostrils daily.   glucose blood test strip Commonly known as:  Visual merchandiser Next Test Use to check BG once a day. Dx 11.9 - type 2 diabetes   hydrochlorothiazide 25 MG tablet Commonly known as:  HYDRODIURIL Take 1 tablet (25 mg total) by mouth daily.   levothyroxine 100 MCG tablet Commonly known as:  SYNTHROID TAKE 1 TABLET BY MOUTH EVERY DAY   metFORMIN 500 MG tablet Commonly known as:  GLUCOPHAGE Take 1 tablet (500 mg total) by mouth 2 (two) times daily with a meal.   multivitamin tablet Take 1 tablet by mouth daily.   rosuvastatin 10 MG tablet Commonly known as:  CRESTOR Take 0.5 tablets (5 mg total) by mouth daily.   Vitamin D3 50 MCG (2000 UT) Tabs Take 1 tablet by mouth.  History (reviewed): Past Medical History:  Diagnosis Date  . Broken ankle left  . Diabetes mellitus without complication ([place])   . Fibroids   . History of fractured kneecap   . Hyperlipidemia   . Hypertension   . Osteopenia   . Thyroid disease    Past Surgical History:  Procedure Laterality Date  . ABDOMINAL HYSTERECTOMY     complete   . ANKLE SURGERY    . BREAST LUMPECTOMY Right    benign  . CHOLECYSTECTOMY    . KNEE SURGERY Right   . ORIF PATELLA  01/22/2012    Procedure: OPEN REDUCTION INTERNAL (ORIF) FIXATION PATELLA;  Surgeon: Meredith Pel, MD;  Location: [facility];  Service: Orthopedics;  Laterality: Left;  . TONSILLECTOMY     Family History  Problem Relation Age of Onset  . Jaundice Mother   . Cancer Mother        breast  . COPD Father   . Heart disease Father        triple bypass  . Heart failure Father   . Diabetes Father   . Thyroid disease Daughter   . Allergies Daughter   . Alcohol abuse Son   . Congenital heart disease Daughter    Social History   Socioeconomic History  . Marital status: Married    Spouse name: Not on file  . Number of children: 3  . Years of education: GED  . Highest education level: GED or equivalent  Occupational History  . 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 Financial resource strain: Not hard at all  . Food insecurity:    Worry: Never true    Inability: Never true  . Transportation needs:    Medical: No    Non-medical: No  Tobacco Use  . Smoking status: Never Smoker  . Smokeless tobacco: Never Used  Substance and Sexual Activity  . Alcohol use: No  . Drug use: No  . Sexual activity: Not Currently  Lifestyle  . Physical activity:    Days per week: 3 days    Minutes per session: 20 min  . Stress: Not at all  Relationships  . Social connections:    Talks on phone: More than three times a week    Gets together: More than three times a week    Attends religious service: More than 4 times per year    Active member of club or organization: Yes    Attends meetings of clubs or organizations: More than 4 times per year    Relationship status: Married  Other Topics Concern  . Not on file  Social History Narrative  . Not on file    Activities of Daily Living In your present state of health, do you have any difficulty performing the following activities: 03/09/2019  Hearing? N  Vision? N  Difficulty concentrating or making  decisions? N  Walking or climbing stairs? N  Dressing or bathing? N  Doing errands, shopping? N  Preparing Food and eating ? N  Using the Toilet? N  In the past six months, have you accidently leaked urine? N  Do you have problems with loss of bowel control? N  Managing your Medications? N  Managing your Finances? N  Housekeeping or managing your Housekeeping? N  Some recent data might be hidden    Patient Literacy How often do you need to have someone help you when you read instructions, pamphlets,  or other written materials from your doctor or pharmacy?: 1 - Never What is the last grade level you completed in school?: 11th  Exercise Current Exercise Habits: Home exercise routine, Type of exercise: walking, Time (Minutes): 20, Frequency (Times/Week): 3, Weekly Exercise (Minutes/Week): 60, Intensity: Mild, Exercise limited by: None identified  Diet Patient reports consuming 2 meals a day and 1 snack(s) a day Patient reports that her primary diet is: Regular Patient reports that she does have regular access to food.   Depression Screen PHQ 2/9 Scores 03/09/2019 12/23/2018 06/20/2018 01/31/2018 12/20/2017 06/17/2017 01/27/2017  PHQ - 2 Score 0 0 0 0 0 0 0  PHQ- 9 Score - 0 - - - - -     Fall Risk Fall Risk  03/09/2019 06/20/2018 01/31/2018 12/20/2017 06/17/2017  Falls in the past year? 0 No No No No  Number falls in past yr: - - - - -  Injury with Fall? - - - - -     Objective:  [name] seemed alert and oriented and she participated appropriately during our telephone visit.  Blood Pressure Weight BMI  BP Readings from Last 3 Encounters:  12/23/18 138/72  06/20/18 110/63  01/31/18 134/73   Wt Readings from Last 3 Encounters:  12/23/18 171 lb (77.6 kg)  06/20/18 171 lb 3.2 oz (77.7 kg)  01/31/18 174 lb (78.9 kg)   BMI Readings from Last 1 Encounters:  12/23/18 33.40 kg/m    *Unable to obtain current vital signs, weight, and BMI due to telephone visit type   Hearing/Vision  . Amrutha did not seem to have difficulty with hearing/understanding during the telephone conversation . Reports that she has not had a formal eye exam by an eye care professional within the past year . Reports that she has not had a formal hearing evaluation within the past year *Unable to fully assess hearing and vision during telephone visit type  Cognitive Function: 6CIT Screen 03/09/2019  What Year? 0 points  What month? 0 points  What time? 0 points  Count back from 20 0 points  Months in reverse 0 points  Repeat phrase 0 points  Total Score 0    Normal Cognitive Function Screening: Yes (Normal:0-7, Significant for Dysfunction: >8)  Immunization & Health Maintenance Record Immunization History  Administered Date(s) Administered  . Influenza, High Dose Seasonal PF 09/08/2017, 09/05/2018  . Influenza,inj,Quad PF,6+ Mos 09/08/2013, 08/30/2014, 08/27/2015, 09/02/2016  . Pneumococcal Conjugate-13 08/28/2015  . Pneumococcal Polysaccharide-23 06/10/2011  . Tdap 07/07/2011  . Zoster 01/03/2014    Health Maintenance  Topic Date Due  . OPHTHALMOLOGY EXAM  01/30/1956  . COLON CANCER SCREENING ANNUAL FOBT  07/26/2018  . URINE MICROALBUMIN  12/20/2018  . INFLUENZA VACCINE  06/10/2019  . HEMOGLOBIN A1C  06/23/2019  . FOOT EXAM  12/24/2019  . DEXA SCAN  06/20/2020  . TETANUS/TDAP  07/06/2021  . Fecal DNA (Cologuard)  07/19/2021  . Hepatitis C Screening  Completed  . PNA vac Low Risk Adult  Completed       Assessment  This is a routine wellness examination for [name].  Health Maintenance: Due or Overdue Health Maintenance Due  Topic Date Due  . OPHTHALMOLOGY EXAM  01/30/1956  . COLON CANCER SCREENING ANNUAL FOBT  07/26/2018  . URINE MICROALBUMIN  12/20/2018    [name] does not need a referral for Community Assistance: Care Management:   no Social Work:    no Prescription Assistance:  no Nutrition/Diabetes Education:  no  Plan:   Personalized Goals Goals Addressed            This Visit's Progress   . DIET - INCREASE LEAN PROTEINS        Personalized Health Maintenance & Screening Recommendations  Pt will schedule diabetic eye exam.  Lung Cancer Screening Recommended: no (Low Dose CT Chest recommended if Age 21-80 years, 30 pack-year currently smoking OR have quit w/in past 15 years) Hepatitis C Screening recommended: no HIV Screening recommended: no  Advanced Directives: Written information was not prepared per patient's request.  Referrals & Orders No orders of the defined types were placed in this encounter.   Follow-up Plan . Follow-up with [name], DO as planned . Schedule diabetic eye exam   I have personally reviewed and noted the following in the patient's chart:   . Medical and social history . Use of alcohol, tobacco or illicit drugs  . Current medications and supplements . Functional ability and status . Nutritional status . Physical activity . Advanced directives . List of other physicians . Hospitalizations, surgeries, and ER visits in previous 12 months . Vitals . Screenings to include cognitive, depression, and falls . Referrals and appointments  In addition, I have reviewed and discussed with [name] certain preventive protocols, quality metrics, and best practice recommendations. A written personalized care plan for preventive services as well as general preventive health recommendations is available and can be mailed to the patient at her request.      signature  03/09/2019  I have reviewed and agree with the above  documentation.   Evelina Dun, FNP

## 2019-05-22 ENCOUNTER — Other Ambulatory Visit: Payer: Self-pay | Admitting: *Deleted

## 2019-05-22 DIAGNOSIS — I152 Hypertension secondary to endocrine disorders: Secondary | ICD-10-CM

## 2019-05-22 DIAGNOSIS — E1159 Type 2 diabetes mellitus with other circulatory complications: Secondary | ICD-10-CM

## 2019-05-22 MED ORDER — HYDROCHLOROTHIAZIDE 25 MG PO TABS: 25.0000 mg | ORAL_TABLET | Freq: Every day | ORAL | 0 refills | Status: DC

## 2019-05-23 DIAGNOSIS — Z1231 Encounter for screening mammogram for malignant neoplasm of breast: Secondary | ICD-10-CM | POA: Diagnosis not present

## 2019-06-23 ENCOUNTER — Ambulatory Visit: Payer: Medicare Other | Admitting: Family Medicine

## 2019-07-20 ENCOUNTER — Other Ambulatory Visit: Payer: Self-pay

## 2019-07-21 ENCOUNTER — Ambulatory Visit (INDEPENDENT_AMBULATORY_CARE_PROVIDER_SITE_OTHER): Payer: Medicare Other | Admitting: Family Medicine

## 2019-07-21 ENCOUNTER — Other Ambulatory Visit: Payer: Self-pay | Admitting: *Deleted

## 2019-07-21 ENCOUNTER — Encounter: Payer: Self-pay | Admitting: Family Medicine

## 2019-07-21 VITALS — BP 120/66 | HR 73 | Temp 98.0°F | Resp 18 | Ht 60.0 in | Wt 168.0 lb

## 2019-07-21 DIAGNOSIS — E1169 Type 2 diabetes mellitus with other specified complication: Secondary | ICD-10-CM

## 2019-07-21 DIAGNOSIS — E039 Hypothyroidism, unspecified: Secondary | ICD-10-CM

## 2019-07-21 DIAGNOSIS — E1159 Type 2 diabetes mellitus with other circulatory complications: Secondary | ICD-10-CM | POA: Diagnosis not present

## 2019-07-21 DIAGNOSIS — E119 Type 2 diabetes mellitus without complications: Secondary | ICD-10-CM

## 2019-07-21 DIAGNOSIS — E785 Hyperlipidemia, unspecified: Secondary | ICD-10-CM | POA: Diagnosis not present

## 2019-07-21 DIAGNOSIS — Z23 Encounter for immunization: Secondary | ICD-10-CM

## 2019-07-21 DIAGNOSIS — I10 Essential (primary) hypertension: Secondary | ICD-10-CM | POA: Diagnosis not present

## 2019-07-21 DIAGNOSIS — L821 Other seborrheic keratosis: Secondary | ICD-10-CM | POA: Diagnosis not present

## 2019-07-21 LAB — BAYER DCA HB A1C WAIVED: HB A1C (BAYER DCA - WAIVED): 5.6 % (ref <7.0)

## 2019-07-21 MED ORDER — HYDROCHLOROTHIAZIDE 25 MG PO TABS: 25.0000 mg | ORAL_TABLET | Freq: Every day | ORAL | 3 refills | Status: DC

## 2019-07-21 MED ORDER — METFORMIN HCL 500 MG PO TABS: 500.0000 mg | ORAL_TABLET | Freq: Two times a day (BID) | ORAL | 0 refills | Status: DC

## 2019-07-21 MED ORDER — ROSUVASTATIN CALCIUM 10 MG PO TABS: 5.0000 mg | ORAL_TABLET | Freq: Every day | ORAL | 3 refills | Status: DC

## 2019-07-21 NOTE — Patient Instructions (Addendum)
You had labs performed today.  You will be contacted with the results of the labs once they are available, usually in the next 3 business days for routine lab work.  If you have an active my chart account, they will be released to your MyChart.  If you prefer to have these labs released to you via telephone, please let us know.  If you had a pap smear or biopsy performed, expect to be contacted in about 7-10 days.  Seborrheic Keratosis A seborrheic keratosis is a common, noncancerous (benign) skin growth. These growths are velvety, waxy, rough, tan, brown, or black spots that appear on the skin. These skin growths can be flat or raised, and scaly. What are the causes? The cause of this condition is not known. What increases the risk? You are more likely to develop this condition if you:  Have a family history of seborrheic keratosis.  Are 50 or older.  Are pregnant.  Have had estrogen replacement therapy. What are the signs or symptoms? Symptoms of this condition include growths on the face, chest, shoulders, back, or other areas. These growths:  Are usually painless, but may become irritated and itchy.  Can be yellow, brown, black, or other colors.  Are slightly raised or have a flat surface.  Are sometimes rough or wart-like in texture.  Are often velvety or waxy on the surface.  Are round or oval-shaped.  Often occur in groups, but may occur as a single growth. How is this diagnosed? This condition is diagnosed with a medical history and physical exam.  A sample of the growth may be tested (skin biopsy).  You may need to see a skin specialist (dermatologist). How is this treated? Treatment is not usually needed for this condition, unless the growths are irritated or bleed often.  You may also choose to have the growths removed if you do not like their appearance. ? Most commonly, these growths are treated with a procedure in which liquid nitrogen is applied to "freeze"  off the growth (cryosurgery). ? They may also be burned off with electricity (electrocautery) or removed by scraping (curettage). Follow these instructions at home:  Watch your growth for any changes.  Keep all follow-up visits as told by your health care provider. This is important.  Do not scratch or pick at the growth or growths. This can cause them to become irritated or infected. Contact a health care provider if:  You suddenly have many new growths.  Your growth bleeds, itches, or hurts.  Your growth suddenly becomes larger or changes color. Summary  A seborrheic keratosis is a common, noncancerous (benign) skin growth.  Treatment is not usually needed for this condition, unless the growths are irritated or bleed often.  Watch your growth for any changes.  Contact a health care provider if you suddenly have many new growths or your growth suddenly becomes larger or changes color.  Keep all follow-up visits as told by your health care provider. This is important. This information is not intended to replace advice given to you by your health care provider. Make sure you discuss any questions you have with your health care provider. Document Released: 11/28/2010 Document Revised: 03/10/2018 Document Reviewed: 03/10/2018 Elsevier Patient Education  2020 [employer].

## 2019-07-21 NOTE — Progress Notes (Signed)
Subjective: CC: DM2,HTN, HLD PCP: [name], DO Vanessa Hubbard is a 73 y.o. female presenting to clinic today for:  1. Type 2 Diabetes w/ HTN, HLD:  Patient reports compliance w. Metformin, Crestor 10 mg daily and hydrochlorothiazide 25 mg daily.   Last eye exam: She notes that she is due this month and will have this scheduled. Last foot exam: UTD  Last A1c:  Lab Results  Component Value Date   HGBA1C 5.6 12/23/2018   Nephropathy screen indicated?: Yes.  Last flu, zoster and/or pneumovax:  Immunization History  Administered Date(s) Administered  . Influenza, High Dose Seasonal PF 09/08/2017, 09/05/2018  . Influenza,inj,Quad PF,6+ Mos 09/08/2013, 08/30/2014, 08/27/2015, 09/02/2016  . Pneumococcal Conjugate-13 08/28/2015  . Pneumococcal Polysaccharide-23 06/10/2011  . Tdap 07/07/2011  . Zoster 01/03/2014   ROS: No chest pain, shortness of breath, unintended weight loss/gain.  She denies any dizziness or visual disturbance.  2.  Hypothyroidism History: No radiation or surgery to the neck.    Reports compliance with Synthroid.  No tremor, heart palpitations, visual disturbance.  No change in voice or difficulty swallowing.  3. Skin lesion She reports a skin lesion on her right hand and 1 on her left shoulder.  She notes that both are itchy and the one on her shoulders tends to get caught on her bra.  She just wanted to have them looked at to make sure that they were benign.  Denies any significant change in size, color or shape.  ROS: Per HPI  No Known Allergies Past Medical History:  Diagnosis Date  . Broken ankle left  . Diabetes mellitus without complication ([place])   . Fibroids   . History of fractured kneecap   . Hyperlipidemia   . Hypertension   . Osteopenia   . Thyroid disease     Current Outpatient Medications:  .  aspirin 81 MG tablet, Take 81 mg by mouth every other day. , Disp: , Rfl:  .  B-D ULTRA-FINE 33 LANCETS MISC, Use to check BG  daily (Dx 11.9 type 2 DM), Disp: 100 each, Rfl: 1 .  cetirizine (ZYRTEC) 10 MG tablet, Take 1 tablet (10 mg total) by mouth daily as needed. Allergies., Disp: 90 tablet, Rfl: 1 .  Cholecalciferol (VITAMIN D3) 2000 UNITS TABS, Take 1 tablet by mouth., Disp: , Rfl:  .  fluticasone (FLONASE) 50 MCG/ACT nasal spray, Place 2 sprays into both nostrils daily., Disp: 16 g, Rfl: 6 .  glucose blood (BAYER CONTOUR NEXT TEST) test strip, Use to check BG once a day. Dx 11.9 - type 2 diabetes, Disp: 50 each, Rfl: 2 .  hydrochlorothiazide (HYDRODIURIL) 25 MG tablet, Take 1 tablet (25 mg total) by mouth daily., Disp: 90 tablet, Rfl: 0 .  levothyroxine (SYNTHROID, LEVOTHROID) 100 MCG tablet, TAKE 1 TABLET BY MOUTH EVERY DAY, Disp: 90 tablet, Rfl: 2 .  metFORMIN (GLUCOPHAGE) 500 MG tablet, Take 1 tablet (500 mg total) by mouth 2 (two) times daily with a meal., Disp: 180 tablet, Rfl: 1 .  Multiple Vitamin (MULTIVITAMIN) tablet, Take 1 tablet by mouth daily., Disp: , Rfl:  .  rosuvastatin (CRESTOR) 10 MG tablet, Take 0.5 tablets (5 mg total) by mouth daily., Disp: 45 tablet, Rfl: 1 Social History   Socioeconomic History  . Marital status: Married    Spouse name: Not on file  . Number of children: 3  . Years of education: GED  . Highest education level: GED or equivalent  Occupational History  . 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 Financial resource strain: Not hard at all  . Food insecurity    Worry: Never true    Inability: Never true  . Transportation needs    Medical: No    Non-medical: No  Tobacco Use  . Smoking status: Never Smoker  . Smokeless tobacco: Never Used  Substance and Sexual Activity  . Alcohol use: No  . Drug use: No  . Sexual activity: Not Currently  Lifestyle  . Physical activity    Days per week: 3 days    Minutes per session: 20 min  . Stress: Not at all  Relationships  . Social connections    Talks on phone: More  than three times a week    Gets together: More than three times a week    Attends religious service: More than 4 times per year    Active member of club or organization: Yes    Attends meetings of clubs or organizations: More than 4 times per year    Relationship status: Married  . Intimate partner violence    Fear of current or ex partner: No    Emotionally abused: No    Physically abused: No    Forced sexual activity: No  Other Topics Concern  . Not on file  Social History Narrative  . Not on file   Family History  Problem Relation Age of Onset  . Jaundice Mother   . Cancer Mother        breast  . COPD Father   . Heart disease Father        triple bypass  . Heart failure Father   . Diabetes Father   . Thyroid disease Daughter   . Allergies Daughter   . Alcohol abuse Son   . Congenital heart disease Daughter     Objective: Office vital signs reviewed. BP 120/66   Pulse 73   Temp 98 F (36.7 C)   Resp 18   Ht 5' (1.524 m)   Wt 168 lb (76.2 kg)   SpO2 97%   BMI 32.81 kg/m   Physical Examination:  General: Awake, alert, well nourished, No acute distress HEENT: Normal, sclera white, MMM, no goiter or exophthalmos. Cardio: regular rate and rhythm, S1S2 heard, no murmurs appreciated Pulm: clear to auscultation bilaterally, no wheezes, rhonchi or rales; normal work of breathing on room air Extremities: warm, well perfused, No edema, cyanosis or clubbing; +2 pulses bilaterally Skin: She has a slightly hyperkeratotic seborrheic keratosis noted along the dorsum of the right hand.  There is a flesh-colored seborrheic keratosis noted on the left shoulder posteriorly. Neuro: No tremor  Assessment/ Plan: 73 y.o. female   1. Controlled type 2 diabetes mellitus without complication, without long-term current use of insulin ([place]) Doing well.  A1c under excellent control at 5.6 today.  Continue current regimen.  She will schedule diabetic eye exam - Bayer DCA Hb A1c  Waived - Microalbumin / creatinine urine ratio  2. Hyperlipidemia associated with type 2 diabetes mellitus ([facility]) Check fasting lipid panel. - CMP14+EGFR - Lipid Panel - rosuvastatin (CRESTOR) 10 MG tablet; Take 0.5 tablets (5 mg total) by mouth daily.  Dispense: 45 tablet; Refill: 3  3. Hypertension associated with diabetes ([place]) Controlled.  Continue current regimen - CMP14+EGFR - hydrochlorothiazide (HYDRODIURIL) 25 MG tablet; Take 1 tablet (25 mg total) by mouth daily.  Dispense: 90 tablet; Refill: 3  4. Hypothyroidism,  unspecified type We will Rx the Synthroid pending thyroid panel. - Thyroid Panel With TSH  5. Need for immunization against influenza Administered during today's visit. - Flu Vaccine QUAD High Dose(Fluad)  6. Seborrheic keratoses Benign and do not appear to be inflamed or irritated.  I did discuss with her that if lesions became more aggravated that we could excise them.  She like to hold off on this for now   Orders Placed This Encounter  Procedures  . Bayer DCA Hb A1c Waived  . Thyroid Panel With TSH  . CMP14+EGFR  . Lipid Panel   No orders of the defined types were placed in this encounter.    [name], DO [place] [phone]

## 2019-07-22 LAB — THYROID PANEL WITH TSH
Free Thyroxine Index: 2.9 (ref 1.2–4.9)
T3 Uptake Ratio: 28 % (ref 24–39)
T4, Total: 10.4 ug/dL (ref 4.5–12.0)
TSH: 0.534 u[IU]/mL (ref 0.450–4.500)

## 2019-07-22 LAB — LIPID PANEL
Chol/HDL Ratio: 1.9 ratio (ref 0.0–4.4)
Cholesterol, Total: 118 mg/dL (ref 100–199)
HDL: 63 mg/dL (ref >39)
LDL Chol Calc (NIH): 34 mg/dL (ref 0–99)
Triglycerides: 118 mg/dL (ref 0–149)
VLDL Cholesterol Cal: 21 mg/dL (ref 5–40)

## 2019-07-22 LAB — CMP14+EGFR
ALT: 17 IU/L (ref 0–32)
AST: 21 IU/L (ref 0–40)
Albumin/Globulin Ratio: 2.7 — ABNORMAL HIGH (ref 1.2–2.2)
Albumin: 4.6 g/dL (ref 3.7–4.7)
Alkaline Phosphatase: 71 IU/L (ref 39–117)
BUN/Creatinine Ratio: 21 (ref 12–28)
BUN: 15 mg/dL (ref 8–27)
Bilirubin Total: 1.2 mg/dL (ref 0.0–1.2)
CO2: 27 mmol/L (ref 20–29)
Calcium: 9.9 mg/dL (ref 8.7–10.3)
Chloride: 100 mmol/L (ref 96–106)
Creatinine, Ser: 0.72 mg/dL (ref 0.57–1.00)
GFR calc Af Amer: 95 mL/min/{1.73_m2} (ref >59)
GFR calc non Af Amer: 82 mL/min/{1.73_m2} (ref >59)
Globulin, Total: 1.7 g/dL (ref 1.5–4.5)
Glucose: 114 mg/dL — ABNORMAL HIGH (ref 65–99)
Potassium: 4.3 mmol/L (ref 3.5–5.2)
Sodium: 143 mmol/L (ref 134–144)
Total Protein: 6.3 g/dL (ref 6.0–8.5)

## 2019-07-22 LAB — MICROALBUMIN / CREATININE URINE RATIO
Creatinine, Urine: 90.6 mg/dL
Microalb/Creat Ratio: 7 mg/g creat (ref 0–29)
Microalbumin, Urine: 6 ug/mL

## 2019-07-24 ENCOUNTER — Other Ambulatory Visit: Payer: Self-pay | Admitting: Family Medicine

## 2019-07-24 DIAGNOSIS — E039 Hypothyroidism, unspecified: Secondary | ICD-10-CM

## 2019-07-24 MED ORDER — LEVOTHYROXINE SODIUM 100 MCG PO TABS: 100.0000 ug | ORAL_TABLET | Freq: Every day | ORAL | 1 refills | Status: DC

## 2019-08-30 ENCOUNTER — Other Ambulatory Visit: Payer: Self-pay | Admitting: Family Medicine

## 2019-08-30 DIAGNOSIS — E1169 Type 2 diabetes mellitus with other specified complication: Secondary | ICD-10-CM

## 2019-10-17 ENCOUNTER — Other Ambulatory Visit: Payer: Self-pay | Admitting: Family Medicine

## 2019-10-17 DIAGNOSIS — E119 Type 2 diabetes mellitus without complications: Secondary | ICD-10-CM

## 2019-12-26 LAB — HM DIABETES EYE EXAM

## 2020-01-21 NOTE — Progress Notes (Signed)
Subjective: CC: DM2,HTN, HLD PCP: [name], DO [id number] Vanessa Hubbard is a 74 y.o. female presenting to clinic today for:  1. Type 2 Diabetes w/ HTN, HLD:  Patient reports compliance w. Metformin, Crestor 10 mg daily and hydrochlorothiazide 25 mg daily.   Last eye exam: UTD Last foot exam: Needs Last A1c:  Lab Results  Component Value Date   HGBA1C 5.6 07/21/2019   Nephropathy screen indicated?: No Last flu, zoster and/or pneumovax:  Immunization History  Administered Date(s) Administered  . Fluad Quad(high Dose 65+) 07/21/2019  . Influenza, High Dose Seasonal PF 09/08/2017, 09/05/2018  . Influenza,inj,Quad PF,6+ Mos 09/08/2013, 08/30/2014, 08/27/2015, 09/02/2016  . Pneumococcal Conjugate-13 08/28/2015  . Pneumococcal Polysaccharide-23 06/10/2011  . Tdap 07/07/2011  . Zoster 01/03/2014   ROS: No chest pain, shortness of breath, unintended weight loss/gain, dizziness or visual disturbance.   2.  Hypothyroidism History: No radiation or surgery to the neck.    Reports compliance with Synthroid.  No tremor, heart palpitations, visual disturbance, change in voice or difficulty swallowing.  3. Nipple discharge Patient with chronic right-sided nipple discharge.  She notes that she is never been able to express it from her nipple but that sometimes she will wake up in the morning with brownish-yellowish spots on her nightgown in bed sheets.  She has had this worked up previously by her previous PCP and has kept up with her mammograms.  Her mammograms have been normal.  She has never had blood work or an MRI of the brain.  Denies any discharge on the left.  No breast pain.  ROS: Per HPI  No Known Allergies Past Medical History:  Diagnosis Date  . Broken ankle left  . Diabetes mellitus without complication ([place])   . Fibroids   . History of fractured kneecap   . Hyperlipidemia   . Hypertension   . Osteopenia   . Thyroid disease     Current Outpatient Medications:   .  aspirin 81 MG tablet, Take 81 mg by mouth every other day. , Disp: , Rfl:  .  Vanessa-D ULTRA-FINE 33 LANCETS MISC, Use to check BG daily (Dx 11.9 type 2 DM), Disp: 100 each, Rfl: 1 .  cetirizine (ZYRTEC) 10 MG tablet, Take 1 tablet (10 mg total) by mouth daily as needed. Allergies., Disp: 90 tablet, Rfl: 1 .  Cholecalciferol (VITAMIN D3) 2000 UNITS TABS, Take 1 tablet by mouth., Disp: , Rfl:  .  fluticasone (FLONASE) 50 MCG/ACT nasal spray, Place 2 sprays into both nostrils daily., Disp: 16 g, Rfl: 6 .  glucose blood (BAYER CONTOUR NEXT TEST) test strip, Use to check BG once a day. Dx 11.9 - type 2 diabetes, Disp: 50 each, Rfl: 2 .  hydrochlorothiazide (HYDRODIURIL) 25 MG tablet, Take 1 tablet (25 mg total) by mouth daily., Disp: 90 tablet, Rfl: 3 .  levothyroxine (SYNTHROID) 100 MCG tablet, Take 1 tablet (100 mcg total) by mouth daily., Disp: 90 tablet, Rfl: 1 .  metFORMIN (GLUCOPHAGE) 500 MG tablet, TAKE 1 TABLET (500 MG TOTAL) BY MOUTH 2 (TWO) TIMES DAILY WITH A MEAL., Disp: 180 tablet, Rfl: 0 .  Multiple Vitamin (MULTIVITAMIN) tablet, Take 1 tablet by mouth daily., Disp: , Rfl:  .  rosuvastatin (CRESTOR) 10 MG tablet, TAKE 1/2 TABLET BY MOUTH DAILY, Disp: 45 tablet, Rfl: 1 Social History   Socioeconomic History  . Marital status: Married    Spouse name: Not on file  . Number of children: 3  . Years of education: GED  .  Highest education level: GED or equivalent  Occupational History  . Occupation: Retired    Fish farm manager: [employer]    Comment: Research and development  Tobacco Use  . Smoking status: Never Smoker  . Smokeless tobacco: Never Used  Substance and Sexual Activity  . Alcohol use: No  . Drug use: No  . Sexual activity: Not Currently  Other Topics Concern  . Not on file  Social History Narrative  . Not on file   Social Determinants of Health   Financial Resource Strain:   . Difficulty of Paying Living Expenses:   Food Insecurity:   . Worried About Charity fundraiser in  the Last Year:   . Arboriculturist in the Last Year:   Transportation Needs:   . Film/video editor (Medical):   Marland Kitchen Lack of Transportation (Non-Medical):   Physical Activity: Insufficiently Active  . Days of Exercise per Week: 3 days  . Minutes of Exercise per Session: 20 min  Stress:   . Feeling of Stress :   Social Connections:   . Frequency of Communication with Friends and Family:   . Frequency of Social Gatherings with Friends and Family:   . Attends Religious Services:   . Active Member of Clubs or Organizations:   . Attends Archivist Meetings:   Marland Kitchen Marital Status:   Intimate Partner Violence:   . Fear of Current or Ex-Partner:   . Emotionally Abused:   Marland Kitchen Physically Abused:   . Sexually Abused:    Family History  Problem Relation Age of Onset  . Jaundice Mother   . Cancer Mother        breast  . COPD Father   . Heart disease Father        triple bypass  . Heart failure Father   . Diabetes Father   . Thyroid disease Daughter   . Allergies Daughter   . Alcohol abuse Son   . Congenital heart disease Daughter     Objective: Office vital signs reviewed. There were no vitals taken for this visit.  Physical Examination:  General: Awake, alert, well nourished, No acute distress HEENT: Normal, sclera white, MMM, no goiter or exophthalmos. Breast: Symmetric, somewhat pendulous.  Left breast appears slightly larger than right.  She does have very mild nipple inversion on the right.  No axillary or supraclavicular lymphadenopathy.  Breast on the right is slightly more nodular than the left but there are no dominant masses palpated. Cardio: regular rate and rhythm, S1S2 heard, no murmurs appreciated Pulm: clear to auscultation bilaterally, no wheezes, rhonchi or rales; normal work of breathing on room air Extremities: warm, well perfused, No edema, cyanosis or clubbing; +2 pulses bilaterally Neuro: see dm foot  Diabetic Foot Exam - Simple   Simple Foot  Form Diabetic Foot exam was performed with the following findings: Yes 01/22/2020  9:54 AM  Visual Inspection No deformities, no ulcerations, no other skin breakdown bilaterally: Yes Sensation Testing Intact to touch and monofilament testing bilaterally: Yes Pulse Check Posterior Tibialis and Dorsalis pulse intact bilaterally: Yes Comments Mild decreased vibratory sensation bilaterally.  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     Assessment/ Plan: 74 y.o. female   1. Controlled type 2 diabetes mellitus without complication, without long-term current use of insulin (HCC) Under excellent control.  Foot exam performed today. - Bayer DCA Hb A1c Waived - metFORMIN (GLUCOPHAGE) 500 MG tablet; Take 1 tablet (500 mg  total) by mouth 2 (two) times daily with a meal.  Dispense: 180 tablet; Refill: 3  2. Hypertension associated with diabetes ([place]) Controlled. - Basic Metabolic Panel  3. Hyperlipidemia associated with type 2 diabetes mellitus ([place]) Continue statin  4. Hypothyroidism due to acquired atrophy of thyroid Asymptomatic - Thyroid Panel With TSH  5. Osteopenia, unspecified location DEXA scan ordered today - DG [facility] DEXA  6. Asymptomatic postmenopausal estrogen deficiency - [facility] DEXA  7. Nipple discharge Uncertain etiology.  May be physiologic.  Her mammograms have been within normal range, I reviewed her most recent mammogram from July 2020 which was BI-RADS 2.  I do not see where she is ever had prolactin obtained.  We will check this with today's labs.  Maybe consider MRI of brain and referral to endocrinology pending this level. - Prolactin   No orders of the defined types were placed in this encounter.  No orders of the defined types were placed in this encounter.    [name], DO [facility] [phone]

## 2020-01-22 ENCOUNTER — Other Ambulatory Visit: Payer: Self-pay

## 2020-01-22 ENCOUNTER — Ambulatory Visit (INDEPENDENT_AMBULATORY_CARE_PROVIDER_SITE_OTHER): Payer: Medicare Other

## 2020-01-22 ENCOUNTER — Ambulatory Visit (INDEPENDENT_AMBULATORY_CARE_PROVIDER_SITE_OTHER): Payer: Medicare Other | Admitting: Family Medicine

## 2020-01-22 ENCOUNTER — Encounter: Payer: Self-pay | Admitting: Family Medicine

## 2020-01-22 VITALS — BP 140/68 | HR 72 | Temp 97.5°F | Ht 61.0 in | Wt 167.6 lb

## 2020-01-22 DIAGNOSIS — N6452 Nipple discharge: Secondary | ICD-10-CM

## 2020-01-22 DIAGNOSIS — Z780 Asymptomatic menopausal state: Secondary | ICD-10-CM

## 2020-01-22 DIAGNOSIS — E119 Type 2 diabetes mellitus without complications: Secondary | ICD-10-CM

## 2020-01-22 DIAGNOSIS — I10 Essential (primary) hypertension: Secondary | ICD-10-CM | POA: Diagnosis not present

## 2020-01-22 DIAGNOSIS — E034 Atrophy of thyroid (acquired): Secondary | ICD-10-CM | POA: Diagnosis not present

## 2020-01-22 DIAGNOSIS — E1169 Type 2 diabetes mellitus with other specified complication: Secondary | ICD-10-CM | POA: Diagnosis not present

## 2020-01-22 DIAGNOSIS — E039 Hypothyroidism, unspecified: Secondary | ICD-10-CM

## 2020-01-22 DIAGNOSIS — M8580 Other specified disorders of bone density and structure, unspecified site: Secondary | ICD-10-CM

## 2020-01-22 DIAGNOSIS — E1159 Type 2 diabetes mellitus with other circulatory complications: Secondary | ICD-10-CM

## 2020-01-22 DIAGNOSIS — E785 Hyperlipidemia, unspecified: Secondary | ICD-10-CM

## 2020-01-22 LAB — BAYER DCA HB A1C WAIVED: HB A1C (BAYER DCA - WAIVED): 6.1 % (ref <7.0)

## 2020-01-22 MED ORDER — METFORMIN HCL 500 MG PO TABS: 500.0000 mg | ORAL_TABLET | Freq: Two times a day (BID) | ORAL | 3 refills | Status: DC

## 2020-01-22 MED ORDER — LEVOTHYROXINE SODIUM 100 MCG PO TABS: 100.0000 ug | ORAL_TABLET | Freq: Every day | ORAL | 1 refills | Status: DC

## 2020-01-22 NOTE — Patient Instructions (Addendum)
I have added additional labs to your blood draw today.  If it comes back abnormal, next step is MRI brain and referral to endocrinology.  If it is normal, I recommend continuing normal scheduled annual mammograms.  Sugar is perfect. A1c 6.1.  No changes.  You had labs performed today.  You will be contacted with the results of the labs once they are available, usually in the next 3 business days for routine lab work.  If you have an active my chart account, they will be released to your MyChart.  If you prefer to have these labs released to you via telephone, please let us know.  If you had a pap smear or biopsy performed, expect to be contacted in about 7-10 days.  You are due for bone density exam.

## 2020-01-23 ENCOUNTER — Other Ambulatory Visit: Payer: Self-pay | Admitting: Family Medicine

## 2020-01-23 ENCOUNTER — Telehealth: Payer: Self-pay | Admitting: Family Medicine

## 2020-01-23 LAB — BASIC METABOLIC PANEL
BUN/Creatinine Ratio: 20 (ref 12–28)
BUN: 14 mg/dL (ref 8–27)
CO2: 26 mmol/L (ref 20–29)
Calcium: 9.4 mg/dL (ref 8.7–10.3)
Chloride: 101 mmol/L (ref 96–106)
Creatinine, Ser: 0.71 mg/dL (ref 0.57–1.00)
GFR calc Af Amer: 96 mL/min/{1.73_m2} (ref >59)
GFR calc non Af Amer: 84 mL/min/{1.73_m2} (ref >59)
Glucose: 110 mg/dL — ABNORMAL HIGH (ref 65–99)
Potassium: 3.3 mmol/L — ABNORMAL LOW (ref 3.5–5.2)
Sodium: 143 mmol/L (ref 134–144)

## 2020-01-23 LAB — THYROID PANEL WITH TSH
Free Thyroxine Index: 2.6 (ref 1.2–4.9)
T3 Uptake Ratio: 27 % (ref 24–39)
T4, Total: 9.7 ug/dL (ref 4.5–12.0)
TSH: 0.563 u[IU]/mL (ref 0.450–4.500)

## 2020-01-23 LAB — PROLACTIN: Prolactin: 8.4 ng/mL (ref 4.8–23.3)

## 2020-01-23 MED ORDER — POTASSIUM CHLORIDE CRYS ER 20 MEQ PO TBCR: 20.0000 meq | EXTENDED_RELEASE_TABLET | Freq: Two times a day (BID) | ORAL | 0 refills | Status: DC

## 2020-01-23 NOTE — Progress Notes (Signed)
Pt called [facility] about her labs today and asked them about the message that she had received from me and they advised pt that no one named Museum/gallery conservator works at [facility]. Pt called me and was interested in program, but states that she will have to call the office back to ask them to make sure this is not a scam

## 2020-01-23 NOTE — Chronic Care Management (AMB) (Signed)
°  Chronic Care Management   Outreach Note  01/23/2020 Name: Vanessa Hubbard MRN: [id number] DOB: 1946/02/01  Vanessa Hubbard is a 74 y.o. year old female who is a primary care patient of [name], DO. I reached out to [name] by phone today in response to a referral sent by [name]'s health plan.     An unsuccessful telephone outreach was attempted today. The patient was referred to the case management team for assistance with care management and care coordination.   Follow Up Plan: A HIPPA compliant phone message was left for the patient providing contact information and requesting a return call.  The care management team will reach out to the patient again over the next 7 days.  If patient returns call to provider office, please advise to call [facility] at [place], [place], [place], [place] [address] Direct Dial: [phone] [email or web address] Website: [email or web address]

## 2020-01-24 NOTE — Chronic Care Management (AMB) (Signed)
  Chronic Care Management   Outreach Note  01/24/2020 Name: MAVERICK LEBRETON MRN: [id number] DOB: 1945/11/18  LARETA LEGGITT is a 74 y.o. year old female who is a primary care patient of [name], DO. I reached out to [name] by phone today in response to a referral sent by Ms. Konrad Dolores [place]erton's health plan.     An unsuccessful telephone outreach was attempted today. The patient was referred to the case management team for assistance with care management and care coordination.   Follow Up Plan: A HIPPA compliant phone message was left for the patient providing contact information and requesting a return call.  The care management team will reach out to the patient again over the next 7 days.  If patient returns call to provider office, please advise to call [facility]  at [place], [place], [place], [place] [address] Direct Dial: [phone] [email or web address] Website: [email or web address]

## 2020-01-31 ENCOUNTER — Telehealth: Payer: Self-pay | Admitting: Family Medicine

## 2020-01-31 NOTE — Chronic Care Management (AMB) (Signed)
  Chronic Care Management   Note  01/31/2020 Name: Vanessa Hubbard MRN: [id number] DOB: 09/03/1946  Vanessa Hubbard is a 74 y.o. year old female who is a primary care patient of [name], DO. I reached out to [name] by phone today in response to a referral sent by [name]'s health plan.     [name] was given information about Chronic Care Management services today including:  1. CCM service includes personalized support from designated clinical staff supervised by her physician, including individualized plan of care and coordination with other care providers 2. 24/7 contact phone numbers for assistance for urgent and routine care needs. 3. Service will only be billed when office clinical staff spend 20 minutes or more in a month to coordinate care. 4. Only one practitioner may furnish and bill the service in a calendar month. 5. The patient may stop CCM services at any time (effective at the end of the month) by phone call to the office staff. 6. The patient will be responsible for cost sharing (co-pay) of up to 20% of the service fee (after annual deductible is met).  Patient agreed to services and verbal consent obtained.   Follow up plan: Telephone appointment with care management team member scheduled for:08/08/2020  Noreene Larsson, [place], [place], [place] [address] Direct Dial: [phone] [email or web address] Website: [email or web address]

## 2020-01-31 NOTE — Chronic Care Management (AMB) (Signed)
  Chronic Care Management   Outreach Note  01/31/2020 Name: Vanessa Hubbard MRN: [id number] DOB: 11/24/1945  Vanessa Hubbard is a 74 y.o. year old female who is a primary care patient of [name], DO. I reached out to [name] by phone today in response to a referral sent by Ms. Konrad Dolores [place]all's health plan.     Third unsuccessful telephone outreach was attempted today. The patient was referred to the case management team for assistance with care management and care coordination. The patient's primary care provider has been notified of our unsuccessful attempts to make or maintain contact with the patient. The care management team is pleased to engage with this patient at any time in the future should he/she be interested in assistance from the care management team.   Follow Up Plan: A HIPPA compliant phone message was left for the patient providing contact information and requesting a return call.  The patient has been provided with contact information for the care management team and has been advised to call with any health related questions or concerns.   Noreene Larsson, [place], [place], [place] [address] Direct Dial: [phone] [email or web address]

## 2020-02-13 ENCOUNTER — Encounter: Payer: Self-pay | Admitting: Family Medicine

## 2020-02-23 ENCOUNTER — Other Ambulatory Visit: Payer: Self-pay | Admitting: Family Medicine

## 2020-02-23 DIAGNOSIS — E1159 Type 2 diabetes mellitus with other circulatory complications: Secondary | ICD-10-CM

## 2020-02-23 DIAGNOSIS — I152 Hypertension secondary to endocrine disorders: Secondary | ICD-10-CM

## 2020-03-11 ENCOUNTER — Ambulatory Visit (INDEPENDENT_AMBULATORY_CARE_PROVIDER_SITE_OTHER): Payer: Medicare Other | Admitting: *Deleted

## 2020-03-11 VITALS — Ht 61.0 in | Wt 167.5 lb

## 2020-03-11 DIAGNOSIS — Z0000 Encounter for general adult medical examination without abnormal findings: Secondary | ICD-10-CM

## 2020-03-11 NOTE — Patient Instructions (Signed)
[facility] Maintenance Summary and Written Plan of Care  Vanessa Hubbard ,  Thank you for allowing me to perform your Medicare Annual Wellness Visit and for your ongoing commitment to your health.   Health Maintenance & Immunization History Health Maintenance  Topic Date Due  . INFLUENZA VACCINE  06/09/2020  . URINE MICROALBUMIN  07/20/2020  . HEMOGLOBIN A1C  07/24/2020  . OPHTHALMOLOGY EXAM  12/25/2020  . FOOT EXAM  01/21/2021  . TETANUS/TDAP  07/06/2021  . DEXA SCAN  01/21/2022  . COVID-19 Vaccine  Completed  . Hepatitis C Screening  Completed  . PNA vac Low Risk Adult  Completed   Immunization History  Administered Date(s) Administered  . Fluad Quad(high Dose 65+) 07/21/2019  . Influenza, High Dose Seasonal PF 09/08/2017, 09/05/2018  . Influenza,inj,Quad PF,6+ Mos 09/08/2013, 08/30/2014, 08/27/2015, 09/02/2016  . Moderna SARS-COVID-2 Vaccination 12/21/2019, 01/19/2020  . Pneumococcal Conjugate-13 08/28/2015  . Pneumococcal Polysaccharide-23 06/10/2011  . Tdap 07/07/2011  . Zoster 01/03/2014    These are the patient goals that we discussed: Goals Addressed            This Visit's Progress   . DIET - INCREASE LEAN PROTEINS       03/11/2020 AWV Goal: Improved Nutrition/Diet  . Patient will verbalize understanding that diet plays an important role in overall health and that a poor diet is a risk factor for many chronic medical conditions.  . Over the next year, patient will improve self management of their diet by incorporating improved meal pattern, increased physical activity, improved protein intake, chew all foods well, and eat 3 meals daily. . Patient will utilize available community resources to help with food acquisition if needed (ex: food pantries, [address], etc) . Patient will work with nutrition specialist if a referral was made     . Exercise 150 min/wk Moderate Activity       03/11/2020 AWV Goal: Exercise for General Health    Patient will verbalize understanding of the benefits of increased physical activity:  Exercising regularly is important. It will improve your overall fitness, flexibility, and endurance.  Regular exercise also will improve your overall health. It can help you control your weight, reduce stress, and improve your bone density.  Over the next year, patient will increase physical activity as tolerated with a goal of at least 150 minutes of moderate physical activity per week.   You can tell that you are exercising at a moderate intensity if your heart starts beating faster and you start breathing faster but can still hold a conversation.  Moderate-intensity exercise ideas include:  Walking 1 mile (1.6 km) in about 15 minutes  Biking  Hiking  Golfing  Dancing  Water aerobics  Patient will verbalize understanding of everyday activities that increase physical activity by providing examples like the following: ? Yard work, such as: ? Pushing a Conservation officer, nature ? Raking and bagging leaves ? Washing your car ? Pushing a stroller ? Shoveling snow ? Gardening ? Washing windows or floors  Patient will be able to explain general safety guidelines for exercising:   Before you start a new exercise program, talk with your health care provider.  Do not exercise so much that you hurt yourself, feel dizzy, or get very short of breath.  Wear comfortable clothes and wear shoes with good support.  Drink plenty of water while you exercise to prevent dehydration or heat stroke.  Work out until your breathing and your heartbeat get faster.  This is a list of Health Maintenance Items that are overdue or due now: There are no preventive care reminders to display for this patient.   Orders/Referrals Placed Today: No orders of the defined types were placed in this encounter.  (Contact our referral department at [phone] if you have not spoken with someone about your referral appointment  within the next 5 days)    Follow-up Plan Follow up with Dr. Lajuana Ripple as scheduled on 07/24/2020 Will check on price of Shingles vaccine (Shingrix) at office visit on 07/24/2020 Look over Advance Directive information given below and discuss with family     Advance Directive  Advance directives are legal documents that let you make choices ahead of time about your health care and medical treatment in case you become unable to communicate for yourself. Advance directives are a way for you to make known your wishes to family, friends, and health care providers. This can let others know about your end-of-life care if you become unable to communicate. Discussing and writing advance directives should happen over time rather than all at once. Advance directives can be changed depending on your situation and what you want, even after you have signed the advance directives. There are different types of advance directives, such as:  Medical power of attorney.  Living will.  Do not resuscitate (DNR) or do not attempt resuscitation (DNAR) order. Health care proxy and medical power of attorney A health care proxy is also called a health care agent. This is a person who is appointed to make medical decisions for you in cases where you are unable to make the decisions yourself. Generally, people choose someone they know well and trust to represent their preferences. Make sure to ask this person for an agreement to act as your proxy. A proxy may have to exercise judgment in the event of a medical decision for which your wishes are not known. A medical power of attorney is a legal document that names your health care proxy. Depending on the laws in your state, after the document is written, it may also need to be:  Signed.  Notarized.  Dated.  Copied.  Witnessed.  Incorporated into your medical record. You may also want to appoint someone to manage your money in a situation in which you are  unable to do so. This is called a durable power of attorney for finances. It is a separate legal document from the durable power of attorney for health care. You may choose the same person or someone different from your health care proxy to act as your agent in money matters. If you do not appoint a proxy, or if there is a concern that the proxy is not acting in your best interests, a court may appoint a guardian to act on your behalf. Living will A living will is a set of instructions that state your wishes about medical care when you cannot express them yourself. Health care providers should keep a copy of your living will in your medical record. You may want to give a copy to family members or friends. To alert caregivers in case of an emergency, you can place a card in your wallet to let them know that you have a living will and where they can find it. A living will is used if you become:  Terminally ill.  Disabled.  Unable to communicate or make decisions. Items to consider in your living will include:  To use or not to use life-support equipment,  such as dialysis machines and breathing machines (ventilators).  A DNR or DNAR order. This tells health care providers not to use cardiopulmonary resuscitation (CPR) if breathing or heartbeat stops.  To use or not to use tube feeding.  To be given or not to be given food and fluids.  Comfort (palliative) care when the goal becomes comfort rather than a cure.  Donation of organs and tissues. A living will does not give instructions for distributing your money and property if you should pass away. DNR or DNAR A DNR or DNAR order is a request not to have CPR in the event that your heart stops beating or you stop breathing. If a DNR or DNAR order has not been made and shared, a health care provider will try to help any patient whose heart has stopped or who has stopped breathing. If you plan to have surgery, talk with your health care provider  about how your DNR or DNAR order will be followed if problems occur. What if I do not have an advance directive? If you do not have an advance directive, some states assign family decision makers to act on your behalf based on how closely you are related to them. Each state has its own laws about advance directives. You may want to check with your health care provider, attorney, or state representative about the laws in your state. Summary  Advance directives are the legal documents that allow you to make choices ahead of time about your health care and medical treatment in case you become unable to tell others about your care.  The process of discussing and writing advance directives should happen over time. You can change the advance directives, even after you have signed them.  Advance directives include DNR or DNAR orders, living wills, and designating an agent as your medical power of attorney. This information is not intended to replace advice given to you by your health care provider. Make sure you discuss any questions you have with your health care provider. Document Revised: 05/25/2019 Document Reviewed: 05/25/2019 Elsevier Patient Education  [place].

## 2020-03-11 NOTE — Progress Notes (Signed)
[place] VISIT  03/11/2020  Telephone Visit Disclaimer This Medicare AWV was conducted by telephone due to national recommendations for restrictions regarding the COVID-19 Pandemic (e.g. social distancing).  I verified, using two identifiers, that I am speaking with Vanessa Hubbard or their authorized healthcare agent. I discussed the limitations, risks, security, and privacy concerns of performing an evaluation and management service by telephone and the potential availability of an in-person appointment in the future. The patient expressed understanding and agreed to proceed.   Subjective:  Vanessa Hubbard is a 74 y.o. female patient of [name], DO who had a Medicare Annual Wellness Visit today via telephone. Vanessa Hubbard is Retired and lives with their spouse. she has 3 children. she reports that she is socially active and does not interact with friends/family regularly. she is minimally physically active and enjoys reading.  Patient Care Team: [name], DO as PCP - General (Family Medicine) Melina Schools, Maribel (Optometry) Clarene Essex, MD as Consulting Physician (Gastroenterology) Ilean China, RN as Registered Nurse  Advanced Directives 03/11/2020 03/09/2019 01/31/2018 01/27/2017 01/22/2016 10/18/2014 01/22/2012  Does Patient Have a Medical Advance Directive? No No No No No No Patient does not have advance directive  Would patient like information on creating a medical advance directive? Yes (MAU/Ambulatory/Procedural Areas - Information given) No - Patient declined No - Patient declined Yes (MAU/Ambulatory/Procedural Areas - Information given) Yes - Educational materials given Yes - Educational materials given -  Pre-existing out of facility DNR order (yellow form or pink MOST form) - - - - - - -    Hospital Utilization Over the Past 12 Months: # of hospitalizations or ER visits: 0 # of surgeries: 0  Review of Systems    Patient reports that her  overall health is unchanged compared to last year.  History obtained from chart review and the patient General ROS: negative  Patient Reported Readings (BP, Pulse, CBG, Weight, etc) none  Pain Assessment Pain : No/denies pain     Current Medications & Allergies (verified) Allergies as of 03/11/2020   No Known Allergies     Medication List       Accurate as of Mar 11, 2020  9:41 AM. If you have any questions, ask your nurse or doctor.        aspirin 81 MG tablet Take 81 mg by mouth every other day.   B-D ULTRA-FINE 33 LANCETS Misc Use to check BG daily (Dx 11.9 type 2 DM)   cetirizine 10 MG tablet Commonly known as: ZYRTEC Take 1 tablet (10 mg total) by mouth daily as needed. Allergies.   fluticasone 50 MCG/ACT nasal spray Commonly known as: FLONASE Place 2 sprays into both nostrils daily.   glucose blood test strip Commonly known as: Visual merchandiser Next Test Use to check BG once a day. Dx 11.9 - type 2 diabetes   hydrochlorothiazide 25 MG tablet Commonly known as: HYDRODIURIL TAKE 1 TABLET BY MOUTH EVERY DAY   levothyroxine 100 MCG tablet Commonly known as: SYNTHROID Take 1 tablet (100 mcg total) by mouth daily.   metFORMIN 500 MG tablet Commonly known as: GLUCOPHAGE Take 1 tablet (500 mg total) by mouth 2 (two) times daily with a meal.   multivitamin tablet Take 1 tablet by mouth daily.   potassium chloride SA 20 MEQ tablet Commonly known as: KLOR-CON Take 1 tablet (20 mEq total) by mouth 2 (two) times daily for 3 days.   rosuvastatin 10 MG  tablet Commonly known as: CRESTOR TAKE 1/2 TABLET BY MOUTH DAILY   Vitamin D3 50 MCG (2000 UT) Tabs Take 1 tablet by mouth.       History (reviewed): Past Medical History:  Diagnosis Date  . Broken ankle left  . Diabetes mellitus without complication ([place])   . Fibroids   . History of fractured kneecap   . Hyperlipidemia   . Hypertension   . Osteopenia   . Thyroid disease    Past Surgical History:    Procedure Laterality Date  . ABDOMINAL HYSTERECTOMY     complete   . ANKLE SURGERY    . BREAST LUMPECTOMY Right    benign  . CHOLECYSTECTOMY    . KNEE SURGERY Right   . ORIF PATELLA  01/22/2012   Procedure: OPEN REDUCTION INTERNAL (ORIF) FIXATION PATELLA;  Surgeon: Meredith Pel, MD;  Location: [facility];  Service: Orthopedics;  Laterality: Left;  . TONSILLECTOMY     Family History  Problem Relation Age of Onset  . Jaundice Mother   . Cancer Mother        breast  . COPD Father   . Heart disease Father        triple bypass  . Heart failure Father   . Diabetes Father   . Thyroid disease Daughter   . Allergies Daughter   . Alcohol abuse Son   . Congenital heart disease Daughter    Social History   Socioeconomic History  . Marital status: Married    Spouse name: Jeneen Rinks "Ezlynn Cobo   . Number of children: 3  . Years of education: GED  . Highest education level: GED or equivalent  Occupational History  . Occupation: Retired    Fish farm manager: [employer]    Comment: Research and development  Tobacco Use  . Smoking status: Never Smoker  . Smokeless tobacco: Never Used  Substance and Sexual Activity  . Alcohol use: No  . Drug use: No  . Sexual activity: Not Currently  Other Topics Concern  . Not on file  Social History Narrative  . Not on file   Social Determinants of Health   Financial Resource Strain: Low Risk   . Difficulty of Paying Living Expenses: Not hard at all  Food Insecurity: No Food Insecurity  . Worried About Charity fundraiser in the Last Year: Never true  . Ran Out of Food in the Last Year: Never true  Transportation Needs: No Transportation Needs  . Lack of Transportation (Medical): No  . Lack of Transportation (Non-Medical): No  Physical Activity: Insufficiently Active  . Days of Exercise per Week: 2 days  . Minutes of Exercise per Session: 30 min  Stress: No Stress Concern Present  . Feeling of Stress : Not at all  Social Connections: Not Isolated   . Frequency of Communication with Friends and Family: More than three times a week  . Frequency of Social Gatherings with Friends and Family: Once a week  . Attends Religious Services: More than 4 times per year  . Active Member of Clubs or Organizations: Yes  . Attends Archivist Meetings: More than 4 times per year  . Marital Status: Married    Activities of Daily Living In your present state of health, do you have any difficulty performing the following activities: 03/11/2020  Hearing? N  Vision? N  Difficulty concentrating or making decisions? N  Walking or climbing stairs? N  Dressing or bathing? N  Doing errands, shopping? N  Preparing  Food and eating ? N  Using the Toilet? N  In the past six months, have you accidently leaked urine? N  Do you have problems with loss of bowel control? N  Managing your Medications? N  Managing your Finances? N  Housekeeping or managing your Housekeeping? N  Some recent data might be hidden    Patient Education/ Literacy How often do you need to have someone help you when you read instructions, pamphlets, or other written materials from your doctor or pharmacy?: 1 - Never What is the last grade level you completed in school?: GED  Exercise Current Exercise Habits: Home exercise routine, Type of exercise: walking, Time (Minutes): 25, Frequency (Times/Week): 2, Weekly Exercise (Minutes/Week): 50, Intensity: Mild, Exercise limited by: None identified  Diet Patient reports consuming 2 meals a day and 1 snack(s) a day Patient reports that her primary diet is: Regular Patient reports that she does have regular access to food.   Depression Screen PHQ 2/9 Scores 03/11/2020 07/21/2019 03/09/2019 12/23/2018 06/20/2018 01/31/2018 12/20/2017  PHQ - 2 Score 0 0 0 0 0 0 0  PHQ- 9 Score - - - 0 - - -     Fall Risk Fall Risk  03/11/2020 07/21/2019 03/09/2019 06/20/2018 01/31/2018  Falls in the past year? 0 1 0 No No  Number falls in past yr: 0 0 - - -   Injury with Fall? 0 0 - - -  Risk for fall due to : No Fall Risks - - - -  Follow up Falls evaluation completed - - - -     Objective:  Vanessa Hubbard seemed alert and oriented and she participated appropriately during our telephone visit.  Blood Pressure Weight BMI  BP Readings from Last 3 Encounters:  01/22/20 140/68  07/21/19 120/66  12/23/18 138/72   Wt Readings from Last 3 Encounters:  03/11/20 167 lb 8.8 oz (76 kg)  01/22/20 167 lb 9.6 oz (76 kg)  07/21/19 168 lb (76.2 kg)   BMI Readings from Last 1 Encounters:  03/11/20 31.66 kg/m    *Unable to obtain current vital signs, weight, and BMI due to telephone visit type  Hearing/Vision  . [place] did not seem to have difficulty with hearing/understanding during the telephone conversation . Reports that she has had a formal eye exam by an eye care professional within the past year . Reports that she has not had a formal hearing evaluation within the past year *Unable to fully assess hearing and vision during telephone visit type  Cognitive Function: 6CIT Screen 03/11/2020 03/09/2019  What Year? 0 points 0 points  What month? 0 points 0 points  What time? 0 points 0 points  Count back from 20 0 points 0 points  Months in reverse 0 points 0 points  Repeat phrase 0 points 0 points  Total Score 0 0   (Normal:0-7, Significant for Dysfunction: >8)  Normal Cognitive Function Screening: Yes   Immunization & Health Maintenance Record Immunization History  Administered Date(s) Administered  . Fluad Quad(high Dose 65+) 07/21/2019  . Influenza, High Dose Seasonal PF 09/08/2017, 09/05/2018  . Influenza,inj,Quad PF,6+ Mos 09/08/2013, 08/30/2014, 08/27/2015, 09/02/2016  . Moderna SARS-COVID-2 Vaccination 12/21/2019, 01/19/2020  . Pneumococcal Conjugate-13 08/28/2015  . Pneumococcal Polysaccharide-23 06/10/2011  . Tdap 07/07/2011  . Zoster 01/03/2014    Health Maintenance  Topic Date Due  . INFLUENZA VACCINE  06/09/2020  .  URINE MICROALBUMIN  07/20/2020  . HEMOGLOBIN A1C  07/24/2020  . OPHTHALMOLOGY EXAM  12/25/2020  .  FOOT EXAM  01/21/2021  . TETANUS/TDAP  07/06/2021  . DEXA SCAN  01/21/2022  . COVID-19 Vaccine  Completed  . Hepatitis C Screening  Completed  . PNA vac Low Risk Adult  Completed       Assessment  This is a routine wellness examination for Vanessa Hubbard.  Health Maintenance: Due or Overdue There are no preventive care reminders to display for this patient.  Vanessa Hubbard does not need a referral for Community Assistance: Care Management:   no Social Work:    no Prescription Assistance:  no Nutrition/Diabetes Education:  no   Plan:  Personalized Goals Goals Addressed            This Visit's Progress   . DIET - INCREASE LEAN PROTEINS       03/11/2020 AWV Goal: Improved Nutrition/Diet  . Patient will verbalize understanding that diet plays an important role in overall health and that a poor diet is a risk factor for many chronic medical conditions.  . Over the next year, patient will improve self management of their diet by incorporating improved meal pattern, increased physical activity, improved protein intake, chew all foods well, and eat 3 meals daily. . Patient will utilize available community resources to help with food acquisition if needed (ex: food pantries, [address], etc) . Patient will work with nutrition specialist if a referral was made     . Exercise 150 min/wk Moderate Activity       03/11/2020 AWV Goal: Exercise for General Health   Patient will verbalize understanding of the benefits of increased physical activity:  Exercising regularly is important. It will improve your overall fitness, flexibility, and endurance.  Regular exercise also will improve your overall health. It can help you control your weight, reduce stress, and improve your bone density.  Over the next year, patient will increase physical activity as tolerated with a goal of at least 150  minutes of moderate physical activity per week.   You can tell that you are exercising at a moderate intensity if your heart starts beating faster and you start breathing faster but can still hold a conversation.  Moderate-intensity exercise ideas include:  Walking 1 mile (1.6 km) in about 15 minutes  Biking  Hiking  Golfing  Dancing  Water aerobics  Patient will verbalize understanding of everyday activities that increase physical activity by providing examples like the following: ? Yard work, such as: ? Pushing a Conservation officer, nature ? Raking and bagging leaves ? Washing your car ? Pushing a stroller ? Shoveling snow ? Gardening ? Washing windows or floors  Patient will be able to explain general safety guidelines for exercising:   Before you start a new exercise program, talk with your health care provider.  Do not exercise so much that you hurt yourself, feel dizzy, or get very short of breath.  Wear comfortable clothes and wear shoes with good support.  Drink plenty of water while you exercise to prevent dehydration or heat stroke.  Work out until your breathing and your heartbeat get faster.       Personalized Health Maintenance & Screening Recommendations  Advanced directives: has NO advanced directive  - add't info requested. Referral to SW: no Shingrix  Lung Cancer Screening Recommended: no (Low Dose CT Chest recommended if Age 18-80 years, 30 pack-year currently smoking OR have quit w/in past 15 years) Hepatitis C Screening recommended: Completed HIV Screening recommended: no  Advanced Directives: Written information was prepared per patient's request.  Referrals & Orders No orders of the defined types were placed in this encounter.   Follow-up Plan . Follow-up with [name], DO as planned . Will check on the price of shingrix at next office visit . Patient to look over Advance Directive information given and discuss with family   I have  personally reviewed and noted the following in the patient's chart:   . Medical and social history . Use of alcohol, tobacco or illicit drugs  . Current medications and supplements . Functional ability and status . Nutritional status . Physical activity . Advanced directives . List of other physicians . Hospitalizations, surgeries, and ER visits in previous 12 months . Vitals . Screenings to include cognitive, depression, and falls . Referrals and appointments  In addition, I have reviewed and discussed with Vanessa Hubbard certain preventive protocols, quality metrics, and best practice recommendations. A written personalized care plan for preventive services as well as general preventive health recommendations is available and can be mailed to the patient at her request.      Wardell Heath, LPN  [id number]  AVS printed and mailed to patient

## 2020-04-11 ENCOUNTER — Encounter: Payer: Self-pay | Admitting: *Deleted

## 2020-05-24 ENCOUNTER — Other Ambulatory Visit: Payer: Self-pay | Admitting: Family Medicine

## 2020-05-24 DIAGNOSIS — Z1231 Encounter for screening mammogram for malignant neoplasm of breast: Secondary | ICD-10-CM

## 2020-07-03 ENCOUNTER — Ambulatory Visit
Admission: RE | Admit: 2020-07-03 | Discharge: 2020-07-03 | Disposition: A | Discharge status: Home / Self Care | Payer: Medicare Other | Source: Ambulatory Visit | Attending: Family Medicine | Admitting: Family Medicine

## 2020-07-03 ENCOUNTER — Other Ambulatory Visit: Payer: Self-pay

## 2020-07-03 DIAGNOSIS — Z1231 Encounter for screening mammogram for malignant neoplasm of breast: Secondary | ICD-10-CM

## 2020-07-24 ENCOUNTER — Ambulatory Visit: Payer: Medicare Other | Admitting: Family Medicine

## 2020-07-30 ENCOUNTER — Ambulatory Visit (INDEPENDENT_AMBULATORY_CARE_PROVIDER_SITE_OTHER): Payer: Medicare Other | Admitting: Family Medicine

## 2020-07-30 ENCOUNTER — Other Ambulatory Visit: Payer: Self-pay

## 2020-07-30 ENCOUNTER — Encounter: Payer: Self-pay | Admitting: Family Medicine

## 2020-07-30 VITALS — BP 129/69 | HR 66 | Temp 97.3°F | Ht 61.0 in | Wt 167.0 lb

## 2020-07-30 DIAGNOSIS — D649 Anemia, unspecified: Secondary | ICD-10-CM

## 2020-07-30 DIAGNOSIS — E1159 Type 2 diabetes mellitus with other circulatory complications: Secondary | ICD-10-CM

## 2020-07-30 DIAGNOSIS — Z23 Encounter for immunization: Secondary | ICD-10-CM | POA: Diagnosis not present

## 2020-07-30 DIAGNOSIS — I10 Essential (primary) hypertension: Secondary | ICD-10-CM

## 2020-07-30 DIAGNOSIS — E119 Type 2 diabetes mellitus without complications: Secondary | ICD-10-CM | POA: Diagnosis not present

## 2020-07-30 DIAGNOSIS — E1169 Type 2 diabetes mellitus with other specified complication: Secondary | ICD-10-CM | POA: Diagnosis not present

## 2020-07-30 DIAGNOSIS — N3000 Acute cystitis without hematuria: Secondary | ICD-10-CM

## 2020-07-30 DIAGNOSIS — E034 Atrophy of thyroid (acquired): Secondary | ICD-10-CM | POA: Diagnosis not present

## 2020-07-30 DIAGNOSIS — E785 Hyperlipidemia, unspecified: Secondary | ICD-10-CM

## 2020-07-30 LAB — URINALYSIS
Bilirubin, UA: NEGATIVE
Glucose, UA: NEGATIVE
Nitrite, UA: NEGATIVE
Protein,UA: NEGATIVE
Specific Gravity, UA: 1.02 (ref 1.005–1.030)
Urobilinogen, Ur: 0.2 mg/dL (ref 0.2–1.0)
pH, UA: 6 (ref 5.0–7.5)

## 2020-07-30 LAB — BAYER DCA HB A1C WAIVED: HB A1C (BAYER DCA - WAIVED): 5.6 % (ref <7.0)

## 2020-07-30 MED ORDER — CEPHALEXIN 500 MG PO CAPS: 500.0000 mg | ORAL_CAPSULE | Freq: Two times a day (BID) | ORAL | 0 refills | Status: AC

## 2020-07-30 MED ORDER — METFORMIN HCL 500 MG PO TABS: 500.0000 mg | ORAL_TABLET | Freq: Every day | ORAL | 3 refills | Status: DC

## 2020-07-30 NOTE — Patient Instructions (Signed)
Sugar is so well controlled, I think we can start backing down on your Metformin.  Go to 1 tablet with breakfast daily.  If your A1c stays below 6, we can plan to get rid of the metformin entirely next visit.  GREAT JOB!  You had labs performed today.  You will be contacted with the results of the labs once they are available, usually in the next 3 business days for routine lab work.  If you have an active my chart account, they will be released to your MyChart.  If you prefer to have these labs released to you via telephone, please let us know.  If you had a pap smear or biopsy performed, expect to be contacted in about 7-10 days.

## 2020-07-30 NOTE — Progress Notes (Signed)
Subjective: CC: DM2, HTN, HLD PCP: [name], DO Vanessa Hubbard is a 74 y.o. female presenting to clinic today for:  1. Type 2 Diabetes w/ HTN, HLD:  Patient reports compliance w. Metformin BID, Crestor 10 mg daily and hydrochlorothiazide 25 mg daily. Overall she has been doing really well.  Last eye exam: UTD Last foot exam: UTD Last A1c:  Lab Results  Component Value Date   HGBA1C 6.1 01/22/2020   Nephropathy screen indicated?: needs Last flu, zoster and/or pneumovax:  Immunization History  Administered Date(s) Administered   Fluad Quad(high Dose 65+) 07/21/2019   Influenza, High Dose Seasonal PF 09/08/2017, 09/05/2018   Influenza,inj,Quad PF,6+ Mos 09/08/2013, 08/30/2014, 08/27/2015, 09/02/2016   Moderna SARS-COVID-2 Vaccination 12/21/2019, 01/19/2020   Pneumococcal Conjugate-13 08/28/2015   Pneumococcal Polysaccharide-23 06/10/2011   Tdap 07/07/2011   Zoster 01/03/2014   ROS: No chest pain, shortness of breath, unintended weight loss/gain, dizziness or visual disturbance.   2.  Hypothyroidism History: No radiation or surgery to the neck.    Reports compliance with Synthroid.  No change in bowel habits, tremor.  3. Urinary frequency Patient reports a 3 to 5-day history of urinary frequency and bladder pressure at the end of urination.  Denies overt pain.  No fevers, nausea or vomiting.  No new back pain.   ROS: Per HPI  No Known Allergies Past Medical History:  Diagnosis Date   Broken ankle left   Diabetes mellitus without complication (HCC)    Fibroids    History of fractured kneecap    Hyperlipidemia    Hypertension    Osteopenia    Thyroid disease     Current Outpatient Medications:    aspirin 81 MG tablet, Take 81 mg by mouth every other day. , Disp: , Rfl:    B-D ULTRA-FINE 33 LANCETS MISC, Use to check BG daily (Dx 11.9 type 2 DM), Disp: 100 each, Rfl: 1   cetirizine (ZYRTEC) 10 MG tablet, Take 1 tablet (10 mg  total) by mouth daily as needed. Allergies., Disp: 90 tablet, Rfl: 1   Cholecalciferol (VITAMIN D3) 2000 UNITS TABS, Take 1 tablet by mouth., Disp: , Rfl:    fluticasone (FLONASE) 50 MCG/ACT nasal spray, Place 2 sprays into both nostrils daily., Disp: 16 g, Rfl: 6   glucose blood (BAYER CONTOUR NEXT TEST) test strip, Use to check BG once a day. Dx 11.9 - type 2 diabetes, Disp: 50 each, Rfl: 2   hydrochlorothiazide (HYDRODIURIL) 25 MG tablet, TAKE 1 TABLET BY MOUTH EVERY DAY, Disp: 90 tablet, Rfl: 1   levothyroxine (SYNTHROID) 100 MCG tablet, Take 1 tablet (100 mcg total) by mouth daily., Disp: 90 tablet, Rfl: 1   metFORMIN (GLUCOPHAGE) 500 MG tablet, Take 1 tablet (500 mg total) by mouth 2 (two) times daily with a meal., Disp: 180 tablet, Rfl: 3   Multiple Vitamin (MULTIVITAMIN) tablet, Take 1 tablet by mouth daily., Disp: , Rfl:    potassium chloride SA (KLOR-CON) 20 MEQ tablet, Take 1 tablet (20 mEq total) by mouth 2 (two) times daily for 3 days., Disp: 6 tablet, Rfl: 0   rosuvastatin (CRESTOR) 10 MG tablet, TAKE 1/2 TABLET BY MOUTH DAILY, Disp: 45 tablet, Rfl: 1 Social History   Socioeconomic History   Marital status: Married    Spouse name: Vanessa Hubbard    Number of children: 3   Years of education: GED   Highest education level: GED or equivalent  Occupational History   Occupation: Retired  Employer: [employer]    Comment: Research and development  Tobacco Use   Smoking status: Never Smoker   Smokeless tobacco: Never Used  Vaping Use   Vaping Use: Never used  Substance and Sexual Activity   Alcohol use: No   Drug use: No   Sexual activity: Not Currently  Other Topics Concern   Not on file  Social History Narrative   Not on file   Social Determinants of Health   Financial Resource Strain: Low Risk    Difficulty of Paying Living Expenses: Not hard at all  Food Insecurity: No Food Insecurity   Worried About Charity fundraiser in the Last Year:  Never true   [place] in the Last Year: Never true  Transportation Needs: No Transportation Needs   Lack of Transportation (Medical): No   Lack of Transportation (Non-Medical): No  Physical Activity: Insufficiently Active   Days of Exercise per Week: 2 days   Minutes of Exercise per Session: 30 min  Stress: No Stress Concern Present   Feeling of Stress : Not at all  Social Connections: Socially Integrated   Frequency of Communication with Friends and Family: More than three times a week   Frequency of Social Gatherings with Friends and Family: Once a week   Attends Religious Services: More than 4 times per year   Active Member of [employer] or Organizations: Yes   Attends Music therapist: More than 4 times per year   Marital Status: Married  Human resources officer Violence: Not At Risk   Fear of Current or Ex-Partner: No   Emotionally Abused: No   Physically Abused: No   Sexually Abused: No   Family History  Problem Relation Age of Onset   Jaundice Mother    Cancer Mother        breast   Breast cancer Mother    COPD Father    Heart disease Father        triple bypass   Heart failure Father    Diabetes Father    Thyroid disease Daughter    Allergies Daughter    Alcohol abuse Son    Congenital heart disease Daughter     Objective: Office vital signs reviewed. BP 129/69    Pulse 66    Temp (!) 97.3 F (36.3 C) (Temporal)    Ht $R'5\' 1"'mJ$  (1.549 m)    Wt 167 lb (75.8 kg)    SpO2 97%    BMI 31.55 kg/m   Physical Examination:  General: Awake, alert, well nourished, No acute distress HEENT: Normal, sclera white, MMM, no goiter  Cardio: regular rate and rhythm, S1S2 heard, no murmurs appreciated Pulm: clear to auscultation bilaterally, no wheezes, rhonchi or rales; normal work of breathing on room air Extremities: warm, well perfused, No edema, cyanosis or clubbing; +2 pulses bilaterally  Assessment/ Plan: 74 y.o. female   1. Controlled  type 2 diabetes mellitus without complication, without long-term current use of insulin (HCC) A1c below 6 today.  Plan to reduce her Metformin to 1 tablet daily with plans to ultimately titrate her off if she can remain below A1c of 6. - Bayer DCA Hb A1c Waived - Microalbumin / creatinine urine ratio - metFORMIN (GLUCOPHAGE) 500 MG tablet; Take 1 tablet (500 mg total) by mouth daily with breakfast.  Dispense: 180 tablet; Refill: 3  2. Hypertension associated with diabetes ([place]) Controlled.  Continue current regimen - CMP14+EGFR  3. Hyperlipidemia associated with type 2 diabetes mellitus ([place])  Not fasting but will obtain lipid panel.  Continue Crestor - Lipid Panel - CMP14+EGFR  4. Hypothyroidism due to acquired atrophy of thyroid Asymptomatic - Thyroid Panel With TSH - CMP14+EGFR  5. Anemia, unspecified type Check CBC - CBC  6. Acute cystitis without hematuria UA consistent with UTI.  Keflex sent. - Urinalysis - cephALEXin (KEFLEX) 500 MG capsule; Take 1 capsule (500 mg total) by mouth 2 (two) times daily for 7 days.  Dispense: 14 capsule; Refill: 0   No orders of the defined types were placed in this encounter.  No orders of the defined types were placed in this encounter.    [name], DO [place] [phone]

## 2020-07-31 ENCOUNTER — Telehealth: Payer: Self-pay | Admitting: Family Medicine

## 2020-07-31 ENCOUNTER — Other Ambulatory Visit: Payer: Self-pay | Admitting: Family Medicine

## 2020-07-31 DIAGNOSIS — E785 Hyperlipidemia, unspecified: Secondary | ICD-10-CM

## 2020-07-31 DIAGNOSIS — I152 Hypertension secondary to endocrine disorders: Secondary | ICD-10-CM

## 2020-07-31 LAB — CMP14+EGFR
ALT: 14 IU/L (ref 0–32)
AST: 18 IU/L (ref 0–40)
Albumin/Globulin Ratio: 2.5 — ABNORMAL HIGH (ref 1.2–2.2)
Albumin: 4.5 g/dL (ref 3.7–4.7)
Alkaline Phosphatase: 76 IU/L (ref 44–121)
BUN/Creatinine Ratio: 18 (ref 12–28)
BUN: 14 mg/dL (ref 8–27)
Bilirubin Total: 1.5 mg/dL — ABNORMAL HIGH (ref 0.0–1.2)
CO2: 25 mmol/L (ref 20–29)
Calcium: 9.4 mg/dL (ref 8.7–10.3)
Chloride: 101 mmol/L (ref 96–106)
Creatinine, Ser: 0.77 mg/dL (ref 0.57–1.00)
GFR calc Af Amer: 87 mL/min/{1.73_m2} (ref >59)
GFR calc non Af Amer: 75 mL/min/{1.73_m2} (ref >59)
Globulin, Total: 1.8 g/dL (ref 1.5–4.5)
Glucose: 113 mg/dL — ABNORMAL HIGH (ref 65–99)
Potassium: 3.7 mmol/L (ref 3.5–5.2)
Sodium: 143 mmol/L (ref 134–144)
Total Protein: 6.3 g/dL (ref 6.0–8.5)

## 2020-07-31 LAB — THYROID PANEL WITH TSH
Free Thyroxine Index: 2.6 (ref 1.2–4.9)
T3 Uptake Ratio: 29 % (ref 24–39)
T4, Total: 9 ug/dL (ref 4.5–12.0)
TSH: 0.226 u[IU]/mL — ABNORMAL LOW (ref 0.450–4.500)

## 2020-07-31 LAB — MICROALBUMIN / CREATININE URINE RATIO
Creatinine, Urine: 140.4 mg/dL
Microalb/Creat Ratio: 14 mg/g creat (ref 0–29)
Microalbumin, Urine: 19.5 ug/mL

## 2020-07-31 LAB — LIPID PANEL
Chol/HDL Ratio: 1.9 ratio (ref 0.0–4.4)
Cholesterol, Total: 120 mg/dL (ref 100–199)
HDL: 64 mg/dL (ref >39)
LDL Chol Calc (NIH): 33 mg/dL (ref 0–99)
Triglycerides: 136 mg/dL (ref 0–149)
VLDL Cholesterol Cal: 23 mg/dL (ref 5–40)

## 2020-07-31 LAB — CBC
Hematocrit: 39.8 % (ref 34.0–46.6)
Hemoglobin: 14 g/dL (ref 11.1–15.9)
MCH: 30.9 pg (ref 26.6–33.0)
MCHC: 35.2 g/dL (ref 31.5–35.7)
MCV: 88 fL (ref 79–97)
Platelets: 256 10*3/uL (ref 150–450)
RBC: 4.53 x10E6/uL (ref 3.77–5.28)
RDW: 12.6 % (ref 11.7–15.4)
WBC: 6.1 10*3/uL (ref 3.4–10.8)

## 2020-07-31 MED ORDER — ROSUVASTATIN CALCIUM 10 MG PO TABS: 5.0000 mg | ORAL_TABLET | Freq: Every day | ORAL | 1 refills | Status: DC

## 2020-07-31 MED ORDER — HYDROCHLOROTHIAZIDE 25 MG PO TABS: 25.0000 mg | ORAL_TABLET | Freq: Every day | ORAL | 1 refills | Status: DC

## 2020-07-31 NOTE — Telephone Encounter (Signed)
In result note

## 2020-07-31 NOTE — Telephone Encounter (Signed)
Patient aware.

## 2020-08-08 ENCOUNTER — Telehealth: Payer: Medicare Other | Admitting: *Deleted

## 2020-08-08 ENCOUNTER — Telehealth: Payer: Self-pay | Admitting: *Deleted

## 2020-08-08 NOTE — Telephone Encounter (Signed)
  Chronic Care Management   Outreach Note  08/08/2020 Name: Vanessa Hubbard MRN: [id number] DOB: 28-Feb-1946  Referred by: [name], DO Reason for referral : Chronic Care Management (initial visit)   An unsuccessful Initial Telephone Visit was attempted today. The patient was referred to the case management team for assistance with care management and care coordination.   Clinical Goals: . Over the next 10 days, patient will be contacted by a Care Guide to reschedule their Initial CCM Visit . Over the next 30 days, patient will have an Initial CCM Visit with a member of the embedded CCM team to discuss self-management of their chronic medical conditions  Interventions and Plan . Chart reviewed in preparation for initial visit telephone call . Collaboration with other care team members as needed . Unsuccessful outreach to patient  . A HIPAA compliant phone message was left for the patient providing contact information and requesting a return call.  . Request sent to care guides to reach out and reschedule patient's initial visit   Chong Sicilian, BSN, RN-BC [place] / [place] Management Direct Dial: [phone]

## 2020-08-09 ENCOUNTER — Ambulatory Visit: Payer: Medicare Other | Admitting: *Deleted

## 2020-08-09 NOTE — Chronic Care Management (AMB) (Signed)
Chronic Care Management   Initial Visit Note  08/09/2020 Name: Vanessa Hubbard MRN: [id number] DOB: [birth year]  Referred by: Vanessa Norlander, DO Reason for referral : Chronic Care Management (initial visit)   Vanessa Hubbard is a 74 y.o. year old female who is a primary care patient of Vanessa Norlander, DO. The CCM team was consulted for assistance with chronic disease management and care coordination needs related to hypertension, hyperlipidemia, controlled diabetes.   Review of patient status, including review of consultants reports, relevant laboratory and other test results, and collaboration with appropriate care team members and the patient's provider was performed as part of comprehensive patient evaluation and provision of chronic care management services.    Subjective: I spoke with Vanessa Hubbard by telephone today regarding management of her chronic medical conditions. She had an AWV in March 2021 and nothing has changed since then. She does not have any CCM or resource needs and she feels that her medical conditions are well managed. Her diabetes is under control and her PCP has decreased her metformin to once a day. She is checking her blood sugar about once a week and has not had any hypoglycemic episodes. We discussed diet, exercise, and weight management. She is able to manage these things independently at this time but appreciated the telephone call and has my telephone number posted on her refrigerator and intends to reach out if she has any concerns.    SDOH (Social Determinants of Health) assessments performed: Yes See Care Plan activities for detailed interventions related to SDOH     Objective: Outpatient Encounter Medications as of 08/09/2020  Medication Sig   aspirin 81 MG tablet Take 81 mg by mouth 2 (two) times a week.    B-D ULTRA-FINE 33 LANCETS MISC Use to check BG daily (Dx 11.9 type 2 DM)   cetirizine (ZYRTEC) 10 MG tablet Take 1 tablet (10 mg total) by  mouth daily as needed. Allergies.   Cholecalciferol (VITAMIN D3) 2000 UNITS TABS Take 1 tablet by mouth.   fluticasone (FLONASE) 50 MCG/ACT nasal spray Place 2 sprays into both nostrils daily.   glucose blood (BAYER CONTOUR NEXT TEST) test strip Use to check BG once a day. Dx 11.9 - type 2 diabetes   hydrochlorothiazide (HYDRODIURIL) 25 MG tablet Take 1 tablet (25 mg total) by mouth daily.   levothyroxine (SYNTHROID) 100 MCG tablet Take 1 tablet (100 mcg total) by mouth daily.   metFORMIN (GLUCOPHAGE) 500 MG tablet Take 1 tablet (500 mg total) by mouth daily with breakfast.   Multiple Vitamin (MULTIVITAMIN) tablet Take 1 tablet by mouth daily.   rosuvastatin (CRESTOR) 10 MG tablet Take 0.5 tablets (5 mg total) by mouth daily.   No facility-administered encounter medications on file as of 08/09/2020.     BP Readings from Last 3 Encounters:  07/30/20 129/69  01/22/20 140/68  07/21/19 120/66   Lab Results  Component Value Date   CHOL 120 07/30/2020   HDL 64 07/30/2020   LDLCALC 33 07/30/2020   TRIG 136 07/30/2020   CHOLHDL 1.9 07/30/2020   Wt Readings from Last 3 Encounters:  07/30/20 167 lb (75.8 kg)  03/11/20 167 lb 8.8 oz (76 kg)  01/22/20 167 lb 9.6 oz (76 kg)   BMI Readings from Last 3 Encounters:  07/30/20 31.55 kg/m  03/11/20 31.66 kg/m  01/22/20 31.67 kg/m     Plan:   CCM enrollment status changed to "previously enrolled" as per patient request on 08/09/2020 to discontinue  enrollment. Case closed to case management services in primary care home.  Patient will reach out to [place] with any CCM or resource needs in the future.   Chong Sicilian, BSN, RN-BC Embedded Chronic Care Manager [facility] [place] [facility] Direct Dial: [phone]

## 2020-08-09 NOTE — Patient Instructions (Signed)
Plan:   CCM enrollment status changed to "previously enrolled" as per patient request on 08/09/2020 to discontinue enrollment. Case closed to case management services in primary care home.  Patient will reach out to [place] with any CCM or resource needs in the future.   Chong Sicilian, BSN, RN-BC Embedded Chronic Care Manager [facility] [place] [facility] Direct Dial: [phone]

## 2020-08-12 NOTE — Telephone Encounter (Signed)
Spoke with patient and completed initial visit on 08/09/20.   Chong Sicilian, BSN, RN-BC Embedded Chronic Care Manager [facility] [place] [facility] Direct Dial: [phone]

## 2020-08-29 ENCOUNTER — Other Ambulatory Visit: Payer: Self-pay | Admitting: Family Medicine

## 2020-08-29 DIAGNOSIS — E034 Atrophy of thyroid (acquired): Secondary | ICD-10-CM

## 2020-08-29 NOTE — Telephone Encounter (Signed)
Dose change per 07/30/20 labwork. 1/2 on Sunday

## 2020-09-30 ENCOUNTER — Other Ambulatory Visit: Payer: Self-pay | Admitting: Family Medicine

## 2020-09-30 DIAGNOSIS — E034 Atrophy of thyroid (acquired): Secondary | ICD-10-CM

## 2020-10-07 ENCOUNTER — Telehealth: Payer: Self-pay

## 2020-10-07 NOTE — Telephone Encounter (Signed)
Patient aware when she needs lab work.

## 2020-10-09 ENCOUNTER — Other Ambulatory Visit: Payer: Self-pay

## 2020-10-09 ENCOUNTER — Other Ambulatory Visit: Payer: Self-pay | Admitting: Family Medicine

## 2020-10-09 ENCOUNTER — Other Ambulatory Visit: Payer: Medicare Other

## 2020-10-09 DIAGNOSIS — E034 Atrophy of thyroid (acquired): Secondary | ICD-10-CM

## 2020-10-21 ENCOUNTER — Telehealth: Payer: Self-pay

## 2020-10-21 NOTE — Telephone Encounter (Signed)
Returned husbands call about lab. Patient came in on 12/1 and blood was drawn but a order was never placed in chart.  Today a TSH was added since blood was still good.  Husband was upset and his concerns will passed along to the clinical lead.

## 2020-10-22 ENCOUNTER — Other Ambulatory Visit: Payer: Self-pay | Admitting: Family Medicine

## 2020-10-22 DIAGNOSIS — E034 Atrophy of thyroid (acquired): Secondary | ICD-10-CM

## 2020-10-22 MED ORDER — LEVOTHYROXINE SODIUM 100 MCG PO TABS: 100.0000 ug | ORAL_TABLET | Freq: Every day | ORAL | 3 refills | Status: DC

## 2020-11-27 NOTE — Progress Notes (Signed)
Subjective: CC: DM PCP: [name], DO Vanessa Hubbard is a 75 y.o. female presenting to clinic today for:  1. Type 2 Diabetes with hypertension, hyperlipidemia:  Patient reports compliance with her medications.  She would like written prescriptions for her HCTZ and Crestor today.  She has enough of her metformin currently.  No reports of chest pain, shortness of breath, dizziness or falls.  Last eye exam: Up-to-date Last foot exam: Up-to-date Last A1c:  Lab Results  Component Value Date   HGBA1C 5.6 07/30/2020   Nephropathy screen indicated?:  Up-to-date Last flu, zoster and/or pneumovax:  Immunization History  Administered Date(s) Administered  . Fluad Quad(high Dose 65+) 07/21/2019, 07/30/2020  . Influenza, High Dose Seasonal PF 09/08/2017, 09/05/2018  . Influenza,inj,Quad PF,6+ Mos 09/08/2013, 08/30/2014, 08/27/2015, 09/02/2016  . Moderna Sars-Covid-2 Vaccination 12/21/2019, 01/19/2020  . Pneumococcal Conjugate-13 08/28/2015  . Pneumococcal Polysaccharide-23 06/10/2011  . Tdap 07/07/2011  . Zoster 01/03/2014    2. arthralgias Patient reports right shoulder pain and left wrist pain that has been present for a while now.  She has used Biofreeze intermittently.  Pain seems to be most prominent when she first lays down and first wakes up.  She is tried adjusting the way that she sleeps with her left wrist.  This is not really helped much.  She is right-hand dominant.  No preceding injury.  No reports of weakness or sensory changes.  3. hypothyroidism Patient is compliant with Synthroid.  No reports of change in voice or tremor.  ROS: Per HPI  No Known Allergies Past Medical History:  Diagnosis Date  . Broken ankle left  . Diabetes mellitus without complication ([place])   . Fibroids   . History of fractured kneecap   . Hyperlipidemia   . Hypertension   . Osteopenia   . Thyroid disease     Current Outpatient Medications:  .  aspirin 81 MG tablet, Take 81  mg by mouth 2 (two) times a week. , Disp: , Rfl:  .  B-D ULTRA-FINE 33 LANCETS MISC, Use to check BG daily (Dx 11.9 type 2 DM), Disp: 100 each, Rfl: 1 .  cetirizine (ZYRTEC) 10 MG tablet, Take 1 tablet (10 mg total) by mouth daily as needed. Allergies., Disp: 90 tablet, Rfl: 1 .  Cholecalciferol (VITAMIN D3) 2000 UNITS TABS, Take 1 tablet by mouth., Disp: , Rfl:  .  fluticasone (FLONASE) 50 MCG/ACT nasal spray, Place 2 sprays into both nostrils daily., Disp: 16 g, Rfl: 6 .  glucose blood (BAYER CONTOUR NEXT TEST) test strip, Use to check BG once a day. Dx 11.9 - type 2 diabetes, Disp: 50 each, Rfl: 2 .  hydrochlorothiazide (HYDRODIURIL) 25 MG tablet, Take 1 tablet (25 mg total) by mouth daily., Disp: 90 tablet, Rfl: 1 .  levothyroxine (SYNTHROID) 100 MCG tablet, Take 1 tablet (100 mcg total) by mouth daily. Except 1/2 on Sunday (needs labwork), Disp: 90 tablet, Rfl: 3 .  metFORMIN (GLUCOPHAGE) 500 MG tablet, Take 1 tablet (500 mg total) by mouth daily with breakfast., Disp: 180 tablet, Rfl: 3 .  Multiple Vitamin (MULTIVITAMIN) tablet, Take 1 tablet by mouth daily., Disp: , Rfl:  .  rosuvastatin (CRESTOR) 10 MG tablet, Take 0.5 tablets (5 mg total) by mouth daily., Disp: 45 tablet, Rfl: 1 Social History   Socioeconomic History  . Marital status: Married    Spouse name: Jeneen Rinks "Vanessa Hubbard   . Number of children: 3  . Years of education: GED  . Highest education  level: GED or equivalent  Occupational History  . 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 Smoking status: Never Smoker  . Smokeless tobacco: Never Used  Vaping Use  . Vaping Use: Never used  Substance and Sexual Activity  . Alcohol use: No  . Drug use: No  . Sexual activity: Not Currently  Other Topics Concern  . Not on file  Social History Narrative  . Not on file   Social Determinants of Health   Financial Resource Strain: Low Risk   . Difficulty of Paying Living  Expenses: Not hard at all  Food Insecurity: No Food Insecurity  . Worried About Programme researcher, broadcasting/film/video in the Last Year: Never true  . Ran Out of Food in the Last Year: Never true  Transportation Needs: No Transportation Needs  . Lack of Transportation (Medical): No  . Lack of Transportation (Non-Medical): No  Physical Activity: Insufficiently Active  . Days of Exercise per Week: 2 days  . Minutes of Exercise per Session: 30 min  Stress: No Stress Concern Present  . Feeling of Stress : Not at all  Social Connections: Socially Integrated  . Frequency of Communication with Friends and Family: More than three times a week  . Frequency of Social Gatherings with Friends and Family: Once a week  . Attends Religious Services: More than 4 times per year  . Active Member of Clubs or Organizations: Yes  . Attends Banker Meetings: More than 4 times per year  . Marital Status: Married  Catering manager Violence: Not At Risk  . Fear of Current or Ex-Partner: No  . Emotionally Abused: No  . Physically Abused: No  . Sexually Abused: No   Family History  Problem Relation Age of Onset  . Jaundice Mother   . Cancer Mother        breast  . Breast cancer Mother   . COPD Father   . Heart disease Father        triple bypass  . Heart failure Father   . Diabetes Father   . Thyroid disease Daughter   . Allergies Daughter   . Alcohol abuse Son   . Congenital heart disease Daughter     Objective: Office vital signs reviewed. BP 134/64   Pulse 67   Temp 97.6 F (36.4 C) (Temporal)   Ht 5\' 1"  (1.549 m)   Wt 171 lb (77.6 kg)   SpO2 97%   BMI 32.31 kg/m   Physical Examination:  General: Awake, alert, well nourished, No acute distress HEENT: Normal; sclera white.  No exophthalmos. Cardio: regular rate and rhythm, S1S2 heard, no murmurs appreciated Pulm: clear to auscultation bilaterally, no wheezes, rhonchi or rales; normal work of breathing on room air Extremities: warm, well  perfused, No edema, cyanosis or clubbing; +2 pulses bilaterally MSK: Normal gait and station.  No significant joint swelling, erythema or warmth appreciated  Assessment/ Plan: 75 y.o. female   Controlled type 2 diabetes mellitus with other specified complication, without long-term current use of insulin (HCC) - Plan: Bayer DCA Hb A1c Waived, Bayer DCA Hb A1c Waived, CMP14+EGFR  Hyperlipidemia associated with type 2 diabetes mellitus (HCC) - Plan: CMP14+EGFR, rosuvastatin (CRESTOR) 10 MG tablet  Hypertension associated with diabetes (HCC) - Plan: CMP14+EGFR, hydrochlorothiazide (HYDRODIURIL) 25 MG tablet  Hypothyroidism due to acquired atrophy of thyroid - Plan: levothyroxine (SYNTHROID) 100 MCG tablet, Thyroid Panel With TSH  Primary osteoarthritis involving  multiple joints - Plan: diclofenac Sodium (VOLTAREN) 1 % GEL  Sugars under excellent control.  Continue current regimen. Continue statin Blood pressure well controlled.  Continue current regimen Asymptomatic from a thyroid standpoint.  Continue current regimen  Voltaren gel recommended to apply to the affected areas twice daily.  We discussed consideration for corticosteroid injection if symptoms or not improving.  Could consider formal physical therapy as well versus evaluation by orthopedics if she desires. Sample Voltaren gel provided today  No orders of the defined types were placed in this encounter.  No orders of the defined types were placed in this encounter.    [name], DO [place] [phone]

## 2020-11-29 ENCOUNTER — Ambulatory Visit (INDEPENDENT_AMBULATORY_CARE_PROVIDER_SITE_OTHER): Payer: Medicare Other | Admitting: Family Medicine

## 2020-11-29 ENCOUNTER — Encounter: Payer: Self-pay | Admitting: Family Medicine

## 2020-11-29 ENCOUNTER — Other Ambulatory Visit: Payer: Self-pay

## 2020-11-29 VITALS — BP 134/64 | HR 67 | Temp 97.6°F | Ht 61.0 in | Wt 171.0 lb

## 2020-11-29 DIAGNOSIS — I152 Hypertension secondary to endocrine disorders: Secondary | ICD-10-CM

## 2020-11-29 DIAGNOSIS — E785 Hyperlipidemia, unspecified: Secondary | ICD-10-CM | POA: Diagnosis not present

## 2020-11-29 DIAGNOSIS — M159 Polyosteoarthritis, unspecified: Secondary | ICD-10-CM

## 2020-11-29 DIAGNOSIS — E034 Atrophy of thyroid (acquired): Secondary | ICD-10-CM

## 2020-11-29 DIAGNOSIS — E1159 Type 2 diabetes mellitus with other circulatory complications: Secondary | ICD-10-CM

## 2020-11-29 DIAGNOSIS — M8949 Other hypertrophic osteoarthropathy, multiple sites: Secondary | ICD-10-CM | POA: Diagnosis not present

## 2020-11-29 DIAGNOSIS — E1169 Type 2 diabetes mellitus with other specified complication: Secondary | ICD-10-CM

## 2020-11-29 LAB — BAYER DCA HB A1C WAIVED: HB A1C (BAYER DCA - WAIVED): 6.3 % (ref <7.0)

## 2020-11-29 MED ORDER — HYDROCHLOROTHIAZIDE 25 MG PO TABS: 25.0000 mg | ORAL_TABLET | Freq: Every day | ORAL | 3 refills | Status: DC

## 2020-11-29 MED ORDER — ROSUVASTATIN CALCIUM 10 MG PO TABS: 5.0000 mg | ORAL_TABLET | Freq: Every day | ORAL | 3 refills | Status: DC

## 2020-11-29 MED ORDER — LEVOTHYROXINE SODIUM 100 MCG PO TABS: 100.0000 ug | ORAL_TABLET | Freq: Every day | ORAL | 3 refills | Status: DC

## 2020-11-29 MED ORDER — DICLOFENAC SODIUM 1 % EX GEL: 2.0000 g | Freq: Four times a day (QID) | CUTANEOUS | 0 refills | Status: DC

## 2020-11-29 NOTE — Patient Instructions (Signed)
Future labs are in if you want to come before your appointment next time.  Voltaren up to twice daily to painful shoulder/ wrist. We can inject that shoulder if needed.

## 2020-12-05 LAB — THYROID PANEL WITH TSH
Free Thyroxine Index: 2.4 (ref 1.2–4.9)
T3 Uptake Ratio: 26 % (ref 24–39)
T4, Total: 9.1 ug/dL (ref 4.5–12.0)
TSH: 1.2 u[IU]/mL (ref 0.450–4.500)

## 2020-12-12 ENCOUNTER — Other Ambulatory Visit: Payer: Self-pay | Admitting: Family Medicine

## 2020-12-12 DIAGNOSIS — E119 Type 2 diabetes mellitus without complications: Secondary | ICD-10-CM

## 2020-12-12 MED ORDER — METFORMIN HCL 500 MG PO TABS: 500.0000 mg | ORAL_TABLET | Freq: Every day | ORAL | 3 refills | Status: DC

## 2021-02-12 ENCOUNTER — Other Ambulatory Visit: Payer: Self-pay | Admitting: Family Medicine

## 2021-02-12 DIAGNOSIS — E1159 Type 2 diabetes mellitus with other circulatory complications: Secondary | ICD-10-CM

## 2021-02-25 ENCOUNTER — Other Ambulatory Visit: Payer: Self-pay | Admitting: Family Medicine

## 2021-02-25 DIAGNOSIS — E1169 Type 2 diabetes mellitus with other specified complication: Secondary | ICD-10-CM

## 2021-02-25 DIAGNOSIS — E785 Hyperlipidemia, unspecified: Secondary | ICD-10-CM

## 2021-03-13 ENCOUNTER — Ambulatory Visit (INDEPENDENT_AMBULATORY_CARE_PROVIDER_SITE_OTHER): Payer: Medicare Other

## 2021-03-13 VITALS — Ht 61.0 in | Wt 167.0 lb

## 2021-03-13 DIAGNOSIS — Z0000 Encounter for general adult medical examination without abnormal findings: Secondary | ICD-10-CM

## 2021-03-13 NOTE — Progress Notes (Signed)
Subjective:   Vanessa Hubbard is a 75 y.o. female who presents for Medicare Annual (Subsequent) preventive examination.  Virtual Visit via Telephone Note  I connected with  Vanessa Hubbard on 03/13/21 at  9:45 AM EDT by telephone and verified that I am speaking with the correct person using two identifiers.  Location: Patient: Home Provider: [facility] Persons participating in the virtual visit: patient/Nurse Health Advisor   I discussed the limitations, risks, security and privacy concerns of performing an evaluation and management service by telephone and the availability of in person appointments. The patient expressed understanding and agreed to proceed.  Interactive audio and video telecommunications were attempted between this nurse and patient, however failed, due to patient having technical difficulties OR patient did not have access to video capability.  We continued and completed visit with audio only.  Some vital signs may be absent or patient reported.   Birdie Fetty E Cynthis Purington, LPN   Review of Systems     Cardiac Risk Factors include: advanced age (>19men, >15 women);diabetes mellitus;family history of premature cardiovascular disease;obesity (BMI >30kg/m2);sedentary lifestyle;dyslipidemia     Objective:    Today's Vitals   03/13/21 0936  Weight: 167 lb (75.8 kg)  Height: 5\' 1"  (1.549 m)   Body mass index is 31.55 kg/m.  Advanced Directives 03/13/2021 03/11/2020 03/09/2019 01/31/2018 01/27/2017 01/22/2016 10/18/2014  Does Patient Have a Medical Advance Directive? No No No No No No No  Would patient like information on creating a medical advance directive? No - Patient declined Yes (MAU/Ambulatory/Procedural Areas - Information given) No - Patient declined No - Patient declined Yes (MAU/Ambulatory/Procedural Areas - Information given) Yes - Educational materials given Yes - Educational materials given  Pre-existing out of facility DNR order (yellow form or pink MOST form) - - - - - - -     Current Medications (verified) Outpatient Encounter Medications as of 03/13/2021  Medication Sig  . aspirin 81 MG tablet Take 81 mg by mouth 2 (two) times a week.   . B-D ULTRA-FINE 33 LANCETS MISC Use to check BG daily (Dx 11.9 type 2 DM)  . cetirizine (ZYRTEC) 10 MG tablet Take 1 tablet (10 mg total) by mouth daily as needed. Allergies.  . Cholecalciferol (VITAMIN D3) 2000 UNITS TABS Take 1 tablet by mouth.  . diclofenac Sodium (VOLTAREN) 1 % GEL Apply 2 g topically 4 (four) times daily.  . fluticasone (FLONASE) 50 MCG/ACT nasal spray Place 2 sprays into both nostrils daily.  Marland Kitchen glucose blood (BAYER CONTOUR NEXT TEST) test strip Use to check BG once a day. Dx 11.9 - type 2 diabetes  . hydrochlorothiazide (HYDRODIURIL) 25 MG tablet TAKE 1 TABLET BY MOUTH EVERY DAY  . levothyroxine (SYNTHROID) 100 MCG tablet Take 1 tablet (100 mcg total) by mouth daily. Except 1/2 on Sunday  . metFORMIN (GLUCOPHAGE) 500 MG tablet Take 1 tablet (500 mg total) by mouth daily with breakfast.  . Multiple Vitamin (MULTIVITAMIN) tablet Take 1 tablet by mouth daily.  . rosuvastatin (CRESTOR) 10 MG tablet TAKE 1/2 TABLET BY MOUTH DAILY   No facility-administered encounter medications on file as of 03/13/2021.    Allergies (verified) Patient has no known allergies.   History: Past Medical History:  Diagnosis Date  . Broken ankle left  . Diabetes mellitus without complication ([place])   . Fibroids   . History of fractured kneecap   . Hyperlipidemia   . Hypertension   . Osteopenia   . Thyroid disease    Past Surgical  History:  Procedure Laterality Date  . ABDOMINAL HYSTERECTOMY     complete   . ANKLE SURGERY    . BREAST LUMPECTOMY Right    benign  . CHOLECYSTECTOMY    . KNEE SURGERY Right   . ORIF PATELLA  01/22/2012   Procedure: OPEN REDUCTION INTERNAL (ORIF) FIXATION PATELLA;  Surgeon: Cammy Copa, MD;  Location: [facility];  Service: Orthopedics;  Laterality: Left;  . TONSILLECTOMY     Family  History  Problem Relation Age of Onset  . Jaundice Mother   . Cancer Mother        breast  . Breast cancer Mother   . COPD Father   . Heart disease Father        triple bypass  . Heart failure Father   . Diabetes Father   . Thyroid disease Daughter   . Allergies Daughter   . Alcohol abuse Son   . Congenital heart disease Daughter    Social History   Socioeconomic History  . Marital status: Married    Spouse name: Fayrene Fearing "Birtie Fellman   . Number of children: 3  . Years of education: GED  . Highest education level: GED or equivalent  Occupational History  . 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 Smoking status: Never Smoker  . Smokeless tobacco: Never Used  Vaping Use  . Vaping Use: Never used  Substance and Sexual Activity  . Alcohol use: No  . Drug use: No  . Sexual activity: Not Currently  Other Topics Concern  . Not on file  Social History Narrative   Lives with her husband   Social Determinants of Health   Financial Resource Strain: Low Risk   . Difficulty of Paying Living Expenses: Not hard at all  Food Insecurity: No Food Insecurity  . Worried About Programme researcher, broadcasting/film/video in the Last Year: Never true  . Ran Out of Food in the Last Year: Never true  Transportation Needs: No Transportation Needs  . Lack of Transportation (Medical): No  . Lack of Transportation (Non-Medical): No  Physical Activity: Insufficiently Active  . Days of Exercise per Week: 4 days  . Minutes of Exercise per Session: 20 min  Stress: No Stress Concern Present  . Feeling of Stress : Not at all  Social Connections: Socially Integrated  . Frequency of Communication with Friends and Family: More than three times a week  . Frequency of Social Gatherings with Friends and Family: More than three times a week  . Attends Religious Services: More than 4 times per year  . Active Member of Clubs or Organizations: Yes  . Attends Tax inspector Meetings: More than 4 times per year  . Marital Status: Married    Tobacco Counseling Counseling given: Not Answered   Clinical Intake:  Pre-visit preparation completed: Yes  Pain : No/denies pain     BMI - recorded: 31.55 Nutritional Status: BMI > 30  Obese Nutritional Risks: None Diabetes: Yes CBG done?: No Did pt. bring in CBG monitor from home?: No  How often do you need to have someone help you when you read instructions, pamphlets, or other written materials from your doctor or pharmacy?: 1 - Never  Nutrition Risk Assessment:  Has the patient had any N/V/D within the last 2 months?  No  Does the patient have any non-healing wounds?  No  Has the patient had any unintentional weight loss  or weight gain?  No   Diabetes:  Is the patient diabetic?  Yes  If diabetic, was a CBG obtained today?  No  Did the patient bring in their glucometer from home?  No  How often do you monitor your CBG's? Once or twice per week.   Financial Strains and Diabetes Management:  Are you having any financial strains with the device, your supplies or your medication? No .  Does the patient want to be seen by Chronic Care Management for management of their diabetes?  No  Would the patient like to be referred to a Nutritionist or for Diabetic Management?  No   Diabetic Exams:  Diabetic Eye Exam: Completed 12/26/2019. Overdue for diabetic eye exam. Pt has been advised about the importance in completing this exam..  Diabetic Foot Exam: Completed 01/22/2020. Pt has been advised about the importance in completing this exam. Pt is scheduled for diabetic foot exam on 04/01/2021.    Interpreter Needed?: No  Information entered by :: Makelle Marrone, LPN   Activities of Daily Living In your present state of health, do you have any difficulty performing the following activities: 03/13/2021  Hearing? N  Vision? N  Difficulty concentrating or making decisions? N  Walking or climbing  stairs? Y  Dressing or bathing? N  Doing errands, shopping? N  Preparing Food and eating ? N  Using the Toilet? N  In the past six months, have you accidently leaked urine? N  Do you have problems with loss of bowel control? N  Managing your Medications? N  Managing your Finances? N  Housekeeping or managing your Housekeeping? N  Some recent data might be hidden    Patient Care Team: [name], DO as PCP - General (Family Medicine) Clarene Essex, MD as Consulting Physician (Gastroenterology) Michel Harrow, [place] as Referring Physician (Chiropractic Medicine)  Indicate any recent Medical Services you may have received from other than [facility] providers in the past year (date may be approximate).     Assessment:   This is a routine wellness examination for Vanessa Hubbard.  Hearing/Vision screen  Hearing Screening   125Hz  250Hz  500Hz  1000Hz  2000Hz  3000Hz  4000Hz  6000Hz  8000Hz   Right ear:           Left ear:           Comments: Denies hearing difficulties  Vision Screening Comments: Wears eyeglasses - Annual visits with [facility] in [place] - past due for eye exam - plans to make appt soon  Dietary issues and exercise activities discussed: Current Exercise Habits: Home exercise routine, Type of exercise: walking, Time (Minutes): 20, Frequency (Times/Week): 4, Weekly Exercise (Minutes/Week): 80, Intensity: Mild, Exercise limited by: orthopedic condition(s)  Goals Addressed            This Visit's Progress   . Exercise 150 min/wk Moderate Activity   On track    03/11/2020 AWV Goal: Exercise for General Health   Patient will verbalize understanding of the benefits of increased physical activity:  Exercising regularly is important. It will improve your overall fitness, flexibility, and endurance.  Regular exercise also will improve your overall health. It can help you control your weight, reduce stress, and improve your bone density.  Over the next year, patient will  increase physical activity as tolerated with a goal of at least 150 minutes of moderate physical activity per week.   You can tell that you are exercising at a moderate intensity if your heart starts beating faster and you  start breathing faster but can still hold a conversation.  Moderate-intensity exercise ideas include:  Walking 1 mile (1.6 km) in about 15 minutes  Biking  Hiking  Golfing  Dancing  Water aerobics  Patient will verbalize understanding of everyday activities that increase physical activity by providing examples like the following: ? Yard work, such as: ? Pushing a Conservation officer, nature ? Raking and bagging leaves ? Washing your car ? Pushing a stroller ? Shoveling snow ? Gardening ? Washing windows or floors  Patient will be able to explain general safety guidelines for exercising:   Before you start a new exercise program, talk with your health care provider.  Do not exercise so much that you hurt yourself, feel dizzy, or get very short of breath.  Wear comfortable clothes and wear shoes with good support.  Drink plenty of water while you exercise to prevent dehydration or heat stroke.  Work out until your breathing and your heartbeat get faster.       Depression Screen PHQ 2/9 Scores 03/13/2021 11/29/2020 07/30/2020 03/11/2020 07/21/2019 03/09/2019 12/23/2018  PHQ - 2 Score 0 0 0 0 0 0 0  PHQ- 9 Score - 0 0 - - - 0    Fall Risk Fall Risk  03/13/2021 11/29/2020 07/30/2020 03/11/2020 07/21/2019  Falls in the past year? 0 0 0 0 1  Number falls in past yr: 0 - - 0 0  Injury with Fall? 0 - - 0 0  Risk for fall due to : Orthopedic patient - - No Fall Risks -  Follow up Falls prevention discussed - - Falls evaluation completed -    FALL RISK PREVENTION PERTAINING TO THE HOME:  Any stairs in or around the home? Yes  If so, are there any without handrails? No  Home free of loose throw rugs in walkways, pet beds, electrical cords, etc? Yes  Adequate lighting in your home  to reduce risk of falls? Yes   ASSISTIVE DEVICES UTILIZED TO PREVENT FALLS:  Life alert? No  Use of a cane, walker or w/c? No  Grab bars in the bathroom? No  Shower chair or bench in shower? No  Elevated toilet seat or a handicapped toilet? No   TIMED UP AND GO:  Was the test performed? No .  Telephonic visit.  Cognitive Function: Normal cognitive status assessed by direct observation by this Nurse Health Advisor. No abnormalities found.   MMSE - Mini Mental State Exam 01/31/2018 01/27/2017 01/22/2016  Orientation to time 5 5 5   Orientation to Place 5 5 5   Registration 3 3 3   Attention/ Calculation 5 5 4   Recall 3 3 3   Language- name 2 objects 2 2 2   Language- repeat 1 1 1   Language- follow 3 step command 3 3 3   Language- read & follow direction 1 1 1   Write a sentence 1 1 1   Copy design 1 1 1   Total score 30 30 29      6CIT Screen 03/11/2020 03/09/2019  What Year? 0 points 0 points  What month? 0 points 0 points  What time? 0 points 0 points  Count back from 20 0 points 0 points  Months in reverse 0 points 0 points  Repeat phrase 0 points 0 points  Total Score 0 0    Immunizations Immunization History  Administered Date(s) Administered  . Fluad Quad(high Dose 65+) 07/21/2019, 07/30/2020  . Influenza, High Dose Seasonal PF 09/08/2017, 09/05/2018  . Influenza,inj,Quad PF,6+ Mos 09/08/2013, 08/30/2014, 08/27/2015, 09/02/2016  .  Moderna Sars-Covid-2 Vaccination 12/21/2019, 01/19/2020, 10/24/2020  . Pneumococcal Conjugate-13 08/28/2015  . Pneumococcal Polysaccharide-23 06/10/2011  . Tdap 07/07/2011  . Zoster 01/03/2014    TDAP status: Up to date  Flu Vaccine status: Up to date  Pneumococcal vaccine status: Up to date  Covid-19 vaccine status: Completed vaccines  Qualifies for Shingles Vaccine? Yes   Zostavax completed Yes   Shingrix Completed?: No.    Education has been provided regarding the importance of this vaccine. Patient has been advised to call insurance  company to determine out of pocket expense if they have not yet received this vaccine. Advised may also receive vaccine at local pharmacy or Health Dept. Verbalized acceptance and understanding.  Screening Tests Health Maintenance  Topic Date Due  . OPHTHALMOLOGY EXAM  12/25/2020  . FOOT EXAM  01/21/2021  . COVID-19 Vaccine (4 - Booster for Moderna series) 04/24/2021  . HEMOGLOBIN A1C  05/29/2021  . INFLUENZA VACCINE  06/09/2021  . MAMMOGRAM  07/03/2021  . TETANUS/TDAP  07/06/2021  . URINE MICROALBUMIN  07/30/2021  . DEXA SCAN  01/21/2022  . Hepatitis C Screening  Completed  . PNA vac Low Risk Adult  Completed  . HPV VACCINES  Aged Out    Health Maintenance  Health Maintenance Due  Topic Date Due  . OPHTHALMOLOGY EXAM  12/25/2020  . FOOT EXAM  01/21/2021    Colorectal cancer screening: No longer required.   Mammogram status: Completed 07/03/2020. Repeat every year  Bone Density status: Completed 01/22/2020. Results reflect: Bone density results: OSTEOPENIA. Repeat every 2 years.  Lung Cancer Screening: (Low Dose CT Chest recommended if Age 58-80 years, 30 pack-year currently smoking OR have quit w/in 15years.) does not qualify.   Additional Screening:  Hepatitis C Screening: does qualify; Completed 03/12/2016  Vision Screening: Recommended annual ophthalmology exams for early detection of glaucoma and other disorders of the eye. Is the patient up to date with their annual eye exam?  No  Who is the provider or what is the name of the office in which the patient attends annual eye exams? [facility] If pt is not established with a provider, would they like to be referred to a provider to establish care? No .   Dental Screening: Recommended annual dental exams for proper oral hygiene  Community Resource Referral / Chronic Care Management: CRR required this visit?  No   CCM required this visit?  No      Plan:     I have personally reviewed and noted the following in  the patient's chart:   . Medical and social history . Use of alcohol, tobacco or illicit drugs  . Current medications and supplements including opioid prescriptions.  . Functional ability and status . Nutritional status . Physical activity . Advanced directives . List of other physicians . Hospitalizations, surgeries, and ER visits in previous 12 months . Vitals . Screenings to include cognitive, depression, and falls . Referrals and appointments  In addition, I have reviewed and discussed with patient certain preventive protocols, quality metrics, and best practice recommendations. A written personalized care plan for preventive services as well as general preventive health recommendations were provided to patient.     [place] Constable, LPN   07/13/7653   Nurse Notes: None

## 2021-03-13 NOTE — Patient Instructions (Signed)
Vanessa Hubbard , Thank you for taking time to come for your Medicare Wellness Visit. I appreciate your ongoing commitment to your health goals. Please review the following plan we discussed and let me know if I can assist you in the future.   Screening recommendations/referrals: Colonoscopy: Cologuard 07/19/2018 - Repeat in 3 years if recommended by doctor Mammogram: Done 07/03/2020 - Repeat every year Bone Density: Done 01/22/2020 - Repeat every 2 years Recommended yearly ophthalmology/optometry visit for glaucoma screening and checkup Recommended yearly dental visit for hygiene and checkup  Vaccinations: Influenza vaccine: Done 07/30/2020 - Repeat every year Pneumococcal vaccine: Done 06/10/2011 & 08/28/2015 Tdap vaccine: Done 07/07/2011 Shingles vaccine: Zostavax done 2015; Due for Shingrix discussed. Please contact your pharmacy for coverage information.    Covid-19: 12/21/19, 01/19/20, & 10/24/20 - due for 2nd booster in June 2022  Advanced directives: Please bring a copy of your health care power of attorney and living will to the office to be added to your chart at your convenience.  Conditions/risks identified: Aim for 30 minutes of exercise or brisk walking each day, drink 6-8 glasses of water and eat lots of fruits and vegetables.  Next appointment: Follow up in one year for your annual wellness visit    Preventive Care 65 Years and Older, Female Preventive care refers to lifestyle choices and visits with your health care provider that can promote health and wellness. What does preventive care include?  A yearly physical exam. This is also called an annual well check.  Dental exams once or twice a year.  Routine eye exams. Ask your health care provider how often you should have your eyes checked.  Personal lifestyle choices, including:  Daily care of your teeth and gums.  Regular physical activity.  Eating a healthy diet.  Avoiding tobacco and drug use.  Limiting alcohol  use.  Practicing safe sex.  Taking low-dose aspirin every day.  Taking vitamin and mineral supplements as recommended by your health care provider. What happens during an annual well check? The services and screenings done by your health care provider during your annual well check will depend on your age, overall health, lifestyle risk factors, and family history of disease. Counseling  Your health care provider may ask you questions about your:  Alcohol use.  Tobacco use.  Drug use.  Emotional well-being.  Home and relationship well-being.  Sexual activity.  Eating habits.  History of falls.  Memory and ability to understand (cognition).  Work and work Statistician.  Reproductive health. Screening  You may have the following tests or measurements:  Height, weight, and BMI.  Blood pressure.  Lipid and cholesterol levels. These may be checked every 5 years, or more frequently if you are over 39 years old.  Skin check.  Lung cancer screening. You may have this screening every year starting at age 72 if you have a 30-pack-year history of smoking and currently smoke or have quit within the past 15 years.  Fecal occult blood test (FOBT) of the stool. You may have this test every year starting at age 14.  Flexible sigmoidoscopy or colonoscopy. You may have a sigmoidoscopy every 5 years or a colonoscopy every 10 years starting at age 18.  Hepatitis C blood test.  Hepatitis B blood test.  Sexually transmitted disease (STD) testing.  Diabetes screening. This is done by checking your blood sugar (glucose) after you have not eaten for a while (fasting). You may have this done every 1-3 years.  Bone density scan.  This is done to screen for osteoporosis. You may have this done starting at age 75.  Mammogram. This may be done every 1-2 years. Talk to your health care provider about how often you should have regular mammograms. Talk with your health care provider about  your test results, treatment options, and if necessary, the need for more tests. Vaccines  Your health care provider may recommend certain vaccines, such as:  Influenza vaccine. This is recommended every year.  Tetanus, diphtheria, and acellular pertussis (Tdap, Td) vaccine. You may need a Td booster every 10 years.  Zoster vaccine. You may need this after age 19.  Pneumococcal 13-valent conjugate (PCV13) vaccine. One dose is recommended after age 19.  Pneumococcal polysaccharide (PPSV23) vaccine. One dose is recommended after age 76. Talk to your health care provider about which screenings and vaccines you need and how often you need them. This information is not intended to replace advice given to you by your health care provider. Make sure you discuss any questions you have with your health care provider. Document Released: 11/22/2015 Document Revised: 07/15/2016 Document Reviewed: 08/27/2015 Elsevier Interactive Patient Education  2017 [place] Prevention in the Home Falls can cause injuries. They can happen to people of all ages. There are many things you can do to make your home safe and to help prevent falls. What can I do on the outside of my home?  Regularly fix the edges of walkways and driveways and fix any cracks.  Remove anything that might make you trip as you walk through a door, such as a raised step or threshold.  Trim any bushes or trees on the path to your home.  Use bright outdoor lighting.  Clear any walking paths of anything that might make someone trip, such as rocks or tools.  Regularly check to see if handrails are loose or broken. Make sure that both sides of any steps have handrails.  Any raised decks and porches should have guardrails on the edges.  Have any leaves, snow, or ice cleared regularly.  Use sand or salt on walking paths during winter.  Clean up any spills in your garage right away. This includes oil or grease spills. What  can I do in the bathroom?  Use night lights.  Install grab bars by the toilet and in the tub and shower. Do not use towel bars as grab bars.  Use non-skid mats or decals in the tub or shower.  If you need to sit down in the shower, use a plastic, non-slip stool.  Keep the floor dry. Clean up any water that spills on the floor as soon as it happens.  Remove soap buildup in the tub or shower regularly.  Attach bath mats securely with double-sided non-slip rug tape.  Do not have throw rugs and other things on the floor that can make you trip. What can I do in the bedroom?  Use night lights.  Make sure that you have a light by your bed that is easy to reach.  Do not use any sheets or blankets that are too big for your bed. They should not hang down onto the floor.  Have a firm chair that has side arms. You can use this for support while you get dressed.  Do not have throw rugs and other things on the floor that can make you trip. What can I do in the kitchen?  Clean up any spills right away.  Avoid walking on wet floors.  Keep items that you use a lot in easy-to-reach places.  If you need to reach something above you, use a strong step stool that has a grab bar.  Keep electrical cords out of the way.  Do not use floor polish or wax that makes floors slippery. If you must use wax, use non-skid floor wax.  Do not have throw rugs and other things on the floor that can make you trip. What can I do with my stairs?  Do not leave any items on the stairs.  Make sure that there are handrails on both sides of the stairs and use them. Fix handrails that are broken or loose. Make sure that handrails are as long as the stairways.  Check any carpeting to make sure that it is firmly attached to the stairs. Fix any carpet that is loose or worn.  Avoid having throw rugs at the top or bottom of the stairs. If you do have throw rugs, attach them to the floor with carpet tape.  Make sure  that you have a light switch at the top of the stairs and the bottom of the stairs. If you do not have them, ask someone to add them for you. What else can I do to help prevent falls?  Wear shoes that:  Do not have high heels.  Have rubber bottoms.  Are comfortable and fit you well.  Are closed at the toe. Do not wear sandals.  If you use a stepladder:  Make sure that it is fully opened. Do not climb a closed stepladder.  Make sure that both sides of the stepladder are locked into place.  Ask someone to hold it for you, if possible.  Clearly mark and make sure that you can see:  Any grab bars or handrails.  First and last steps.  Where the edge of each step is.  Use tools that help you move around (mobility aids) if they are needed. These include:  Canes.  Walkers.  Scooters.  Crutches.  Turn on the lights when you go into a dark area. Replace any light bulbs as soon as they burn out.  Set up your furniture so you have a clear path. Avoid moving your furniture around.  If any of your floors are uneven, fix them.  If there are any pets around you, be aware of where they are.  Review your medicines with your doctor. Some medicines can make you feel dizzy. This can increase your chance of falling. Ask your doctor what other things that you can do to help prevent falls. This information is not intended to replace advice given to you by your health care provider. Make sure you discuss any questions you have with your health care provider. Document Released: 08/22/2009 Document Revised: 04/02/2016 Document Reviewed: 11/30/2014 Elsevier Interactive Patient Education  2017 [employer].

## 2021-03-30 ENCOUNTER — Other Ambulatory Visit: Payer: Self-pay | Admitting: Family Medicine

## 2021-03-30 DIAGNOSIS — E119 Type 2 diabetes mellitus without complications: Secondary | ICD-10-CM

## 2021-03-31 NOTE — Progress Notes (Addendum)
Subjective: CC: Dm PCP: [name], DO VEL:FYBOFB Vanessa Hubbard is a 75 y.o. female presenting to clinic today for:  1. Type 2 Diabetes with hypertension, hyperlipidemia:  Compliant with metformin, Crestor and hydrochlorothiazide.  Monitoring blood sugar and typically is 85-97 with an occasional high of 120.  Last eye exam: needs Last foot exam: needs Last A1c:  Lab Results  Component Value Date   HGBA1C 6.3 11/29/2020   Nephropathy screen indicated?: UTD Last flu, zoster and/or pneumovax:  Immunization History  Administered Date(s) Administered   Fluad Quad(high Dose 65+) 07/21/2019, 07/30/2020   Influenza, High Dose Seasonal PF 09/08/2017, 09/05/2018   Influenza,inj,Quad PF,6+ Mos 09/08/2013, 08/30/2014, 08/27/2015, 09/02/2016   Moderna Sars-Covid-2 Vaccination 12/21/2019, 01/19/2020, 10/24/2020   Pneumococcal Conjugate-13 08/28/2015   Pneumococcal Polysaccharide-23 06/10/2011   Tdap 07/07/2011   Zoster 01/03/2014    ROS: No chest pain, shortness of breath, edema.  No polydipsia or polyuria.  She did have some left-sided flank pain that radiated down into the pelvis over the course of several weeks but that resolved.  She had some dark urine but no overt hematuria.  There is a strong family history of nephrolithiasis.  She was not checked for this but denies any other symptoms today.  2. Wrist pain/nail deformity Continues to have intermittent left-sided forearm/wrist pain that seems to be dependent upon how much she uses her cell phone.  No radicular symptoms.  She reports a deformity on the right pointer finger nail as well.  She has a small little cystic area near the cuticle that is nontender.  3.  Hypothyroidism Patient compliant with Synthroid 100 mcg daily except for 50 mcg on Sundays.  No heart palpitations, tremor, change in bowel habits.  ROS: Per HPI  No Known Allergies Past Medical History:  Diagnosis Date   Broken ankle left   Diabetes mellitus  without complication (HCC)    Fibroids    History of fractured kneecap    Hyperlipidemia    Hypertension    Osteopenia    Thyroid disease     Current Outpatient Medications:    aspirin 81 MG tablet, Take 81 mg by mouth 2 (two) times a week. , Disp: , Rfl:    Vanessa-D ULTRA-FINE 33 LANCETS MISC, Use to check BG daily (Dx 11.9 type 2 DM), Disp: 100 each, Rfl: 1   cetirizine (ZYRTEC) 10 MG tablet, Take 1 tablet (10 mg total) by mouth daily as needed. Allergies., Disp: 90 tablet, Rfl: 1   Cholecalciferol (VITAMIN D3) 2000 UNITS TABS, Take 1 tablet by mouth., Disp: , Rfl:    diclofenac Sodium (VOLTAREN) 1 % GEL, Apply 2 g topically 4 (four) times daily., Disp: 20 g, Rfl: 0   fluticasone (FLONASE) 50 MCG/ACT nasal spray, Place 2 sprays into both nostrils daily., Disp: 16 g, Rfl: 6   glucose blood (BAYER CONTOUR NEXT TEST) test strip, Use to check BG once a day. Dx 11.9 - type 2 diabetes, Disp: 50 each, Rfl: 2   hydrochlorothiazide (HYDRODIURIL) 25 MG tablet, TAKE 1 TABLET BY MOUTH EVERY DAY, Disp: 90 tablet, Rfl: 0   levothyroxine (SYNTHROID) 100 MCG tablet, Take 1 tablet (100 mcg total) by mouth daily. Except 1/2 on Sunday, Disp: 90 tablet, Rfl: 3   metFORMIN (GLUCOPHAGE) 500 MG tablet, Take 1 tablet (500 mg total) by mouth daily with breakfast., Disp: 90 tablet, Rfl: 3   Multiple Vitamin (MULTIVITAMIN) tablet, Take 1 tablet by mouth daily., Disp: , Rfl:    rosuvastatin (CRESTOR)  10 MG tablet, TAKE 1/2 TABLET BY MOUTH DAILY, Disp: 45 tablet, Rfl: 0 Social History   Socioeconomic History   Marital status: Married    Spouse name: Jeneen Rinks "Clair Gulling" Manuele    Number of children: 3   Years of education: GED   Highest education level: GED or equivalent  Occupational History   Occupation: Retired    Fish farm manager: [employer]    Comment: Research and development  Tobacco Use   Smoking status: Never Smoker   Smokeless tobacco: Never Used  Scientific laboratory technician Use: Never used  Substance and Sexual Activity    Alcohol use: No   Drug use: No   Sexual activity: Not Currently  Other Topics Concern   Not on file  Social History Narrative   Lives with her husband   Social Determinants of Health   Financial Resource Strain: Low Risk    Difficulty of Paying Living Expenses: Not hard at all  Food Insecurity: No Food Insecurity   Worried About Charity fundraiser in the Last Year: Never true   Arboriculturist in the Last Year: Never true  Transportation Needs: No Transportation Needs   Lack of Transportation (Medical): No   Lack of Transportation (Non-Medical): No  Physical Activity: Insufficiently Active   Days of Exercise per Week: 4 days   Minutes of Exercise per Session: 20 min  Stress: No Stress Concern Present   Feeling of Stress : Not at all  Social Connections: Socially Integrated   Frequency of Communication with Friends and Family: More than three times a week   Frequency of Social Gatherings with Friends and Family: More than three times a week   Attends Religious Services: More than 4 times per year   Active Member of [employer] or Organizations: Yes   Attends Music therapist: More than 4 times per year   Marital Status: Married  Human resources officer Violence: Not At Risk   Fear of Current or Ex-Partner: No   Emotionally Abused: No   Physically Abused: No   Sexually Abused: No   Family History  Problem Relation Age of Onset   Jaundice Mother    Cancer Mother        breast   Breast cancer Mother    COPD Father    Heart disease Father        triple bypass   Heart failure Father    Diabetes Father    Thyroid disease Daughter    Allergies Daughter    Alcohol abuse Son    Congenital heart disease Daughter     Objective: Office vital signs reviewed. BP 133/65   Pulse 72   Temp (!) 97.2 F (36.2 C)   Ht 5\' 1"  (1.549 m)   Wt 170 lb 6.4 oz (77.3 kg)   SpO2 97%   BMI 32.20 kg/m   Physical Examination:  General: Awake, alert, well nourished, No acute  distress HEENT: Normal; sclera white.  Moist mucous membranes Cardio: regular rate and rhythm, S1S2 heard, no murmurs appreciated Pulm: clear to auscultation bilaterally, no wheezes, rhonchi or rales; normal work of breathing on room air MSK: Small ventral cyst noted just above the DIP near the cuticle on the right pointer finger. Neuro: see Dm foot Diabetic Foot Exam - Simple   Simple Foot Form Diabetic Foot exam was performed with the following findings: Yes 04/01/2021 10:10 AM  Visual Inspection See comments: Yes Sensation Testing Intact to touch and monofilament testing bilaterally: Yes  Pulse Check Posterior Tibialis and Dorsalis pulse intact bilaterally: Yes Comments +2 pedal pulses.  She has hammer toeing of multiple toes.  There is a preulcerative callus noted on the plantar surface of the right foot at approximately the fourth digit.  She has similar along the plantar surface of the left lateral foot.      Assessment/ Plan: 75 y.o. female   Controlled type 2 diabetes mellitus with other specified complication, without long-term current use of insulin ([place]) - Plan: Bayer DCA Hb A1c Waived, For Home Use Only DME Diabetic Shoe  Primary osteoarthritis involving multiple joints  Hypothyroidism due to acquired atrophy of thyroid - Plan: TSH, T4, Free  Pre-ulcerative corn or callous - Plan: For Home Use Only DME Diabetic Shoe  Other polyneuropathy - Plan: For Home Use Only DME Diabetic Shoe  Left flank pain  Prescription for diabetic shoes provided.  Given preulcerative calluses and neuropathic changes in her last 2 digits of the left foot, I worry about her having complications.  Check thyroid levels.  She is asymptomatic  Question if left flank pain was possibly due to a renal stone.  Since this is resolved, no imaging or further work-up performed but I did give her a sieve to have on hand  No orders of the defined types were placed in this encounter.  No orders of the  defined types were placed in this encounter.   **Addendum to previous erroneous dictation of Synthroid 150.  Chart has been corrected to 50 mcg. AG-04/18/21  [name], [place] [phone]

## 2021-04-01 ENCOUNTER — Other Ambulatory Visit: Payer: Self-pay

## 2021-04-01 ENCOUNTER — Ambulatory Visit (INDEPENDENT_AMBULATORY_CARE_PROVIDER_SITE_OTHER): Payer: Medicare Other | Admitting: Family Medicine

## 2021-04-01 ENCOUNTER — Encounter: Payer: Self-pay | Admitting: Family Medicine

## 2021-04-01 VITALS — BP 133/65 | HR 72 | Temp 97.2°F | Ht 61.0 in | Wt 170.4 lb

## 2021-04-01 DIAGNOSIS — G6289 Other specified polyneuropathies: Secondary | ICD-10-CM | POA: Diagnosis not present

## 2021-04-01 DIAGNOSIS — L84 Corns and callosities: Secondary | ICD-10-CM | POA: Diagnosis not present

## 2021-04-01 DIAGNOSIS — E1169 Type 2 diabetes mellitus with other specified complication: Secondary | ICD-10-CM

## 2021-04-01 DIAGNOSIS — R109 Unspecified abdominal pain: Secondary | ICD-10-CM | POA: Diagnosis not present

## 2021-04-01 DIAGNOSIS — E034 Atrophy of thyroid (acquired): Secondary | ICD-10-CM | POA: Diagnosis not present

## 2021-04-01 DIAGNOSIS — E119 Type 2 diabetes mellitus without complications: Secondary | ICD-10-CM

## 2021-04-01 DIAGNOSIS — M159 Polyosteoarthritis, unspecified: Secondary | ICD-10-CM

## 2021-04-01 DIAGNOSIS — M8949 Other hypertrophic osteoarthropathy, multiple sites: Secondary | ICD-10-CM

## 2021-04-01 LAB — BAYER DCA HB A1C WAIVED: HB A1C (BAYER DCA - WAIVED): 6.2 % (ref <7.0)

## 2021-04-01 NOTE — Patient Instructions (Signed)

## 2021-04-02 LAB — TSH: TSH: 1.8 u[IU]/mL (ref 0.450–4.500)

## 2021-04-02 LAB — T4, FREE: Free T4: 1.44 ng/dL (ref 0.82–1.77)

## 2021-04-18 ENCOUNTER — Telehealth: Payer: Self-pay

## 2021-04-18 MED ORDER — METFORMIN HCL 500 MG PO TABS: 1.0000 | ORAL_TABLET | Freq: Every day | ORAL | 3 refills | Status: DC

## 2021-04-18 NOTE — Telephone Encounter (Signed)
Corrections made

## 2021-04-18 NOTE — Addendum Note (Signed)
Addended by: [name] on: 04/18/2021 12:01 PM   Modules accepted: Orders

## 2021-04-18 NOTE — Telephone Encounter (Signed)
Patient's husband would like [place] to make corrections on some incorrect items in patient's last office visit note.  Note states that patient is taking Synthyroid 100 mcg daily except for 150 mcg on Sundays.  This should states except for 50 mcg on Sundays.  Also, med list shows patient is taking Metformin 500 mg bid.  Patient is taking Metformin 500 mg daily.

## 2021-05-20 ENCOUNTER — Other Ambulatory Visit: Payer: Self-pay | Admitting: Family Medicine

## 2021-05-20 DIAGNOSIS — Z1231 Encounter for screening mammogram for malignant neoplasm of breast: Secondary | ICD-10-CM

## 2021-07-04 ENCOUNTER — Ambulatory Visit (INDEPENDENT_AMBULATORY_CARE_PROVIDER_SITE_OTHER): Payer: Medicare Other | Admitting: Family Medicine

## 2021-07-04 ENCOUNTER — Other Ambulatory Visit: Payer: Self-pay

## 2021-07-04 ENCOUNTER — Encounter: Payer: Self-pay | Admitting: Family Medicine

## 2021-07-04 VITALS — BP 136/72 | HR 72 | Temp 97.9°F | Ht 61.0 in | Wt 170.6 lb

## 2021-07-04 DIAGNOSIS — E034 Atrophy of thyroid (acquired): Secondary | ICD-10-CM | POA: Diagnosis not present

## 2021-07-04 DIAGNOSIS — I152 Hypertension secondary to endocrine disorders: Secondary | ICD-10-CM

## 2021-07-04 DIAGNOSIS — E1169 Type 2 diabetes mellitus with other specified complication: Secondary | ICD-10-CM | POA: Diagnosis not present

## 2021-07-04 DIAGNOSIS — E1159 Type 2 diabetes mellitus with other circulatory complications: Secondary | ICD-10-CM | POA: Diagnosis not present

## 2021-07-04 DIAGNOSIS — E785 Hyperlipidemia, unspecified: Secondary | ICD-10-CM | POA: Diagnosis not present

## 2021-07-04 LAB — BAYER DCA HB A1C WAIVED: HB A1C (BAYER DCA - WAIVED): 6.1 % (ref <7.0)

## 2021-07-04 MED ORDER — LEVOTHYROXINE SODIUM 100 MCG PO TABS: 100.0000 ug | ORAL_TABLET | Freq: Every day | ORAL | 3 refills | Status: DC

## 2021-07-04 MED ORDER — ROSUVASTATIN CALCIUM 10 MG PO TABS: 5.0000 mg | ORAL_TABLET | Freq: Every day | ORAL | 3 refills | Status: DC

## 2021-07-04 MED ORDER — HYDROCHLOROTHIAZIDE 25 MG PO TABS: 25.0000 mg | ORAL_TABLET | Freq: Every day | ORAL | 3 refills | Status: DC

## 2021-07-04 NOTE — Progress Notes (Signed)
Subjective: CC: DM PCP: Vanessa Norlander, DO AXE:NMMHWK B Vanessa Hubbard is a 75 y.o. female presenting to clinic today for:  1. Type 2 Diabetes with hypertension, hyperlipidemia:  Reports compliance with metformin 500 mg daily, Crestor 5 mg daily, hydrochlorothiazide 25 mg daily, aspirin 81 mg a few times a week.  She admits that she is not walking regularly due to the heat and worries that her sugar may be up some.  She has gained some weight as a result as well.   Last eye exam: needs; scheduled for January or February of next year Last foot exam: UTD Last A1c:  Lab Results  Component Value Date   HGBA1C 6.2 04/01/2021   Nephropathy screen indicated?: needs Last flu, zoster and/or pneumovax:  Immunization History  Administered Date(s) Administered   Fluad Quad(high Dose 65+) 07/21/2019, 07/30/2020   Influenza, High Dose Seasonal PF 09/08/2017, 09/05/2018   Influenza,inj,Quad PF,6+ Mos 09/08/2013, 08/30/2014, 08/27/2015, 09/02/2016   Moderna Sars-Covid-2 Vaccination 12/21/2019, 01/19/2020, 10/24/2020   Pneumococcal Conjugate-13 08/28/2015   Pneumococcal Polysaccharide-23 06/10/2011   Tdap 07/07/2011   Zoster, Live 01/03/2014    ROS: Denies dizziness, LOC, polyuria, polydipsia, unintended weight loss/gain, foot ulcerations, numbness or tingling in extremities, shortness of breath or chest pain.  2.  Hypothyroidism Compliant with Synthroid.  No change in voice, difficulty swallowing or tremor   ROS: Per HPI  No Known Allergies Past Medical History:  Diagnosis Date   Broken ankle left   Diabetes mellitus without complication (HCC)    Fibroids    History of fractured kneecap    Hyperlipidemia    Hypertension    Osteopenia    Thyroid disease     Current Outpatient Medications:    aspirin 81 MG tablet, Take 81 mg by mouth 2 (two) times a week. , Disp: , Rfl:    B-D ULTRA-FINE 33 LANCETS MISC, Use to check BG daily (Dx 11.9 type 2 DM), Disp: 100 each, Rfl: 1    cetirizine (ZYRTEC) 10 MG tablet, Take 1 tablet (10 mg total) by mouth daily as needed. Allergies., Disp: 90 tablet, Rfl: 1   Cholecalciferol (VITAMIN D3) 2000 UNITS TABS, Take 1 tablet by mouth., Disp: , Rfl:    diclofenac Sodium (VOLTAREN) 1 % GEL, Apply 2 g topically 4 (four) times daily., Disp: 20 g, Rfl: 0   fluticasone (FLONASE) 50 MCG/ACT nasal spray, Place 2 sprays into both nostrils daily., Disp: 16 g, Rfl: 6   glucose blood (BAYER CONTOUR NEXT TEST) test strip, Use to check BG once a day. Dx 11.9 - type 2 diabetes, Disp: 50 each, Rfl: 2   hydrochlorothiazide (HYDRODIURIL) 25 MG tablet, TAKE 1 TABLET BY MOUTH EVERY DAY, Disp: 90 tablet, Rfl: 0   levothyroxine (SYNTHROID) 100 MCG tablet, Take 1 tablet (100 mcg total) by mouth daily. Except 1/2 on Sunday, Disp: 90 tablet, Rfl: 3   metFORMIN (GLUCOPHAGE) 500 MG tablet, Take 1 tablet (500 mg total) by mouth daily with breakfast., Disp: 90 tablet, Rfl: 3   Multiple Vitamin (MULTIVITAMIN) tablet, Take 1 tablet by mouth daily., Disp: , Rfl:    rosuvastatin (CRESTOR) 10 MG tablet, TAKE 1/2 TABLET BY MOUTH DAILY, Disp: 45 tablet, Rfl: 0 Social History   Socioeconomic History   Marital status: Married    Spouse name: Jeneen Rinks "Clair Gulling" Kotch    Number of children: 3   Years of education: GED   Highest education level: GED or equivalent  Occupational History   Occupation: Retired    Fish farm manager:  [employer]    Comment: Research and development  Tobacco Use   Smoking status: Never   Smokeless tobacco: Never  Vaping Use   Vaping Use: Never used  Substance and Sexual Activity   Alcohol use: No   Drug use: No   Sexual activity: Not Currently  Other Topics Concern   Not on file  Social History Narrative   Lives with her husband   Social Determinants of Health   Financial Resource Strain: Low Risk    Difficulty of Paying Living Expenses: Not hard at all  Food Insecurity: No Food Insecurity   Worried About Charity fundraiser in the Last Year: Never  true   [place] in the Last Year: Never true  Transportation Needs: No Transportation Needs   Lack of Transportation (Medical): No   Lack of Transportation (Non-Medical): No  Physical Activity: Insufficiently Active   Days of Exercise per Week: 4 days   Minutes of Exercise per Session: 20 min  Stress: No Stress Concern Present   Feeling of Stress : Not at all  Social Connections: Socially Integrated   Frequency of Communication with Friends and Family: More than three times a week   Frequency of Social Gatherings with Friends and Family: More than three times a week   Attends Religious Services: More than 4 times per year   Active Member of [employer] or Organizations: Yes   Attends Music therapist: More than 4 times per year   Marital Status: Married  Human resources officer Violence: Not At Risk   Fear of Current or Ex-Partner: No   Emotionally Abused: No   Physically Abused: No   Sexually Abused: No   Family History  Problem Relation Age of Onset   Jaundice Mother    Cancer Mother        breast   Breast cancer Mother    COPD Father    Heart disease Father        triple bypass   Heart failure Father    Diabetes Father    Thyroid disease Daughter    Allergies Daughter    Alcohol abuse Son    Congenital heart disease Daughter     Objective: Office vital signs reviewed. BP 136/72   Pulse 72   Temp 97.9 F (36.6 C)   Ht $R'5\' 1"'sh$  (1.549 m)   Wt 170 lb 9.6 oz (77.4 kg)   SpO2 99%   BMI 32.23 kg/m   Physical Examination:  General: Awake, alert, well nourished, No acute distress HEENT: Normal; sclera white.  Moist mucous membranes.  No exophthalmos.  No goiter Cardio: regular rate and rhythm, S1S2 heard, no murmurs appreciated Pulm: clear to auscultation bilaterally, no wheezes, rhonchi or rales; normal work of breathing on room air GI: soft, non-tender, non-distended, bowel sounds present x4, no hepatomegaly, no splenomegaly, no masses   Assessment/  Plan: 75 y.o. female   Controlled type 2 diabetes mellitus with other specified complication, without long-term current use of insulin ([place]) - Plan: Bayer DCA Hb A1c Waived, Microalbumin / creatinine urine ratio  Hyperlipidemia associated with type 2 diabetes mellitus ([place]) - Plan: CMP14+EGFR, Lipid Panel, rosuvastatin (CRESTOR) 10 MG tablet  Hypertension associated with diabetes ([place]) - Plan: CMP14+EGFR, hydrochlorothiazide (HYDRODIURIL) 25 MG tablet  Hypothyroidism due to acquired atrophy of thyroid - Plan: TSH, T4, Free, levothyroxine (SYNTHROID) 100 MCG tablet  Osteopenia of lumbar spine  Sugar remains under good control with A1c of 6.1 today.  Check urine microalbumin.  Discussed consideration for replacing the metformin with something like Ozempic or Trulicity to promote weight loss.  She will consider this going forward  Check lipid panel, CMP.  Continue Crestor 5 mg daily.  Written prescriptions have been provided for this and her antihypertensive  Blood pressure at goal.  Continue current regimen  Asymptomatic from a thyroid standpoint.  Previous thyroid panel normal.  Renewal of Synthroid provided   No orders of the defined types were placed in this encounter.  No orders of the defined types were placed in this encounter.    Vanessa Norlander, DO [place] [phone]

## 2021-07-05 LAB — CMP14+EGFR
ALT: 15 IU/L (ref 0–32)
AST: 19 IU/L (ref 0–40)
Albumin/Globulin Ratio: 2.2 (ref 1.2–2.2)
Albumin: 4.4 g/dL (ref 3.7–4.7)
Alkaline Phosphatase: 77 IU/L (ref 44–121)
BUN/Creatinine Ratio: 15 (ref 12–28)
BUN: 12 mg/dL (ref 8–27)
Bilirubin Total: 1.4 mg/dL — ABNORMAL HIGH (ref 0.0–1.2)
CO2: 24 mmol/L (ref 20–29)
Calcium: 9.8 mg/dL (ref 8.7–10.3)
Chloride: 99 mmol/L (ref 96–106)
Creatinine, Ser: 0.8 mg/dL (ref 0.57–1.00)
Globulin, Total: 2 g/dL (ref 1.5–4.5)
Glucose: 123 mg/dL — ABNORMAL HIGH (ref 65–99)
Potassium: 4 mmol/L (ref 3.5–5.2)
Sodium: 141 mmol/L (ref 134–144)
Total Protein: 6.4 g/dL (ref 6.0–8.5)
eGFR: 76 mL/min/{1.73_m2} (ref >59)

## 2021-07-05 LAB — LIPID PANEL
Chol/HDL Ratio: 1.8 ratio (ref 0.0–4.4)
Cholesterol, Total: 127 mg/dL (ref 100–199)
HDL: 71 mg/dL (ref >39)
LDL Chol Calc (NIH): 35 mg/dL (ref 0–99)
Triglycerides: 125 mg/dL (ref 0–149)
VLDL Cholesterol Cal: 21 mg/dL (ref 5–40)

## 2021-07-05 LAB — TSH: TSH: 1.29 u[IU]/mL (ref 0.450–4.500)

## 2021-07-05 LAB — T4, FREE: Free T4: 1.56 ng/dL (ref 0.82–1.77)

## 2021-07-06 LAB — MICROALBUMIN / CREATININE URINE RATIO
Creatinine, Urine: 56 mg/dL
Microalb/Creat Ratio: 16 mg/g creat (ref 0–29)
Microalbumin, Urine: 9.1 ug/mL

## 2021-07-09 ENCOUNTER — Ambulatory Visit
Admission: RE | Admit: 2021-07-09 | Discharge: 2021-07-09 | Disposition: A | Discharge status: Home / Self Care | Payer: Medicare Other | Source: Ambulatory Visit | Attending: Family Medicine | Admitting: Family Medicine

## 2021-07-09 ENCOUNTER — Other Ambulatory Visit: Payer: Self-pay

## 2021-07-09 DIAGNOSIS — Z1231 Encounter for screening mammogram for malignant neoplasm of breast: Secondary | ICD-10-CM | POA: Diagnosis not present

## 2021-08-06 ENCOUNTER — Ambulatory Visit (INDEPENDENT_AMBULATORY_CARE_PROVIDER_SITE_OTHER): Payer: Medicare Other

## 2021-08-06 ENCOUNTER — Other Ambulatory Visit: Payer: Self-pay

## 2021-08-06 DIAGNOSIS — Z23 Encounter for immunization: Secondary | ICD-10-CM | POA: Diagnosis not present

## 2021-08-21 DIAGNOSIS — Z23 Encounter for immunization: Secondary | ICD-10-CM | POA: Diagnosis not present

## 2021-09-09 ENCOUNTER — Encounter: Payer: Self-pay | Admitting: Family Medicine

## 2021-09-09 ENCOUNTER — Other Ambulatory Visit: Payer: Self-pay | Admitting: Family Medicine

## 2021-09-09 DIAGNOSIS — Z1211 Encounter for screening for malignant neoplasm of colon: Secondary | ICD-10-CM

## 2021-09-09 NOTE — Telephone Encounter (Signed)
ordered

## 2021-10-07 DIAGNOSIS — Z1211 Encounter for screening for malignant neoplasm of colon: Secondary | ICD-10-CM | POA: Diagnosis not present

## 2021-10-10 ENCOUNTER — Ambulatory Visit (INDEPENDENT_AMBULATORY_CARE_PROVIDER_SITE_OTHER): Payer: Medicare Other | Admitting: Family Medicine

## 2021-10-10 ENCOUNTER — Encounter: Payer: Self-pay | Admitting: Family Medicine

## 2021-10-10 VITALS — BP 140/82 | HR 71 | Temp 97.9°F | Ht 61.0 in | Wt 170.2 lb

## 2021-10-10 DIAGNOSIS — E034 Atrophy of thyroid (acquired): Secondary | ICD-10-CM

## 2021-10-10 DIAGNOSIS — E1159 Type 2 diabetes mellitus with other circulatory complications: Secondary | ICD-10-CM | POA: Diagnosis not present

## 2021-10-10 DIAGNOSIS — E1169 Type 2 diabetes mellitus with other specified complication: Secondary | ICD-10-CM

## 2021-10-10 DIAGNOSIS — I152 Hypertension secondary to endocrine disorders: Secondary | ICD-10-CM | POA: Diagnosis not present

## 2021-10-10 DIAGNOSIS — E785 Hyperlipidemia, unspecified: Secondary | ICD-10-CM

## 2021-10-10 LAB — BAYER DCA HB A1C WAIVED: HB A1C (BAYER DCA - WAIVED): 5.5 % (ref 4.8–5.6)

## 2021-10-10 NOTE — Patient Instructions (Signed)
A1c 5.5 today.  This is much lower than your check in August (6.1).  I think that discontinuing the Metformin is a good idea.  You likely experienced low blood sugar when you were shopping.

## 2021-10-10 NOTE — Progress Notes (Signed)
Subjective: CC:DM PCP: Vanessa Norlander, DO OBS:Vanessa Hubbard is a 75 y.o. female presenting to clinic today for:  1. Type 2 Diabetes with hypertension, hyperlipidemia:  Patient is compliant with her metformin, Crestor and hydrochlorothiazide.  No chest pain, shortness of breath or edema.  She does report an episode when she was shopping with her daughter recently where she got overheated and felt faint.  She did not pass out but did have to sit down temporarily to regain her bearings.  She felt much better after she ate lunch.  Blood sugars tend to be in the 90s but she did not measure her blood sugar during that time  Last eye exam: needs, scheduled 11/2021 Last foot exam: UTD Last A1c:  Lab Results  Component Value Date   HGBA1C 6.1 07/04/2021   Nephropathy screen indicated?: UTD Last flu, zoster and/or pneumovax:  Immunization History  Administered Date(s) Administered   Fluad Quad(high Dose 65+) 07/21/2019, 07/30/2020, 08/06/2021   Influenza, High Dose Seasonal PF 09/08/2017, 09/05/2018   Influenza,inj,Quad PF,6+ Mos 09/08/2013, 08/30/2014, 08/27/2015, 09/02/2016   Moderna Sars-Covid-2 Vaccination 12/21/2019, 01/19/2020, 10/24/2020   Pneumococcal Conjugate-13 08/28/2015   Pneumococcal Polysaccharide-23 06/10/2011   Tdap 07/07/2011   Zoster, Live 01/03/2014    2. Hypothyroidism Compliant with Synthroid.  No change in voice, difficulty swallowing, tremor or heart palpitations.  She has chronic loose stool with use of metformin  ROS: Per HPI  No Known Allergies Past Medical History:  Diagnosis Date   Broken ankle left   Diabetes mellitus without complication (HCC)    Fibroids    History of fractured kneecap    Hyperlipidemia    Hypertension    Osteopenia    Thyroid disease     Current Outpatient Medications:    aspirin 81 MG tablet, Take 81 mg by mouth 2 (two) times a week. , Disp: , Rfl:    B-D ULTRA-FINE 33 LANCETS MISC, Use to check BG daily (Dx 11.9 type  2 DM), Disp: 100 each, Rfl: 1   cetirizine (ZYRTEC) 10 MG tablet, Take 1 tablet (10 mg total) by mouth daily as needed. Allergies., Disp: 90 tablet, Rfl: 1   Cholecalciferol (VITAMIN D3) 2000 UNITS TABS, Take 1 tablet by mouth., Disp: , Rfl:    diclofenac Sodium (VOLTAREN) 1 % GEL, Apply 2 g topically 4 (four) times daily., Disp: 20 g, Rfl: 0   fluticasone (FLONASE) 50 MCG/ACT nasal spray, Place 2 sprays into both nostrils daily., Disp: 16 g, Rfl: 6   glucose blood (BAYER CONTOUR NEXT TEST) test strip, Use to check BG once a day. Dx 11.9 - type 2 diabetes, Disp: 50 each, Rfl: 2   hydrochlorothiazide (HYDRODIURIL) 25 MG tablet, Take 1 tablet (25 mg total) by mouth daily., Disp: 90 tablet, Rfl: 3   levothyroxine (SYNTHROID) 100 MCG tablet, Take 1 tablet (100 mcg total) by mouth daily. Except 1/2 on Sunday, Disp: 90 tablet, Rfl: 3   metFORMIN (GLUCOPHAGE) 500 MG tablet, Take 1 tablet (500 mg total) by mouth daily with breakfast., Disp: 90 tablet, Rfl: 3   Multiple Vitamin (MULTIVITAMIN) tablet, Take 1 tablet by mouth daily., Disp: , Rfl:    rosuvastatin (CRESTOR) 10 MG tablet, Take 0.5 tablets (5 mg total) by mouth daily., Disp: 45 tablet, Rfl: 3 Social History   Socioeconomic History   Marital status: Married    Spouse name: Vanessa Hubbard    Number of children: 3   Years of education: GED   Highest education level:  GED or equivalent  Occupational History   Occupation: Retired    Fish farm manager: [employer]    CommentTax adviser  Tobacco Use   Smoking status: Never   Smokeless tobacco: Never  Vaping Use   Vaping Use: Never used  Substance and Sexual Activity   Alcohol use: No   Drug use: No   Sexual activity: Not Currently  Other Topics Concern   Not on file  Social History Narrative   Lives with her husband   Social Determinants of Health   Financial Resource Strain: Low Risk    Difficulty of Paying Living Expenses: Not hard at all  Food Insecurity: No Food Insecurity    Worried About Charity fundraiser in the Last Year: Never true   Arboriculturist in the Last Year: Never true  Transportation Needs: No Transportation Needs   Lack of Transportation (Medical): No   Lack of Transportation (Non-Medical): No  Physical Activity: Insufficiently Active   Days of Exercise per Week: 4 days   Minutes of Exercise per Session: 20 min  Stress: No Stress Concern Present   Feeling of Stress : Not at all  Social Connections: Socially Integrated   Frequency of Communication with Friends and Family: More than three times a week   Frequency of Social Gatherings with Friends and Family: More than three times a week   Attends Religious Services: More than 4 times per year   Active Member of [employer] or Organizations: Yes   Attends Music therapist: More than 4 times per year   Marital Status: Married  Human resources officer Violence: Not At Risk   Fear of Current or Ex-Partner: No   Emotionally Abused: No   Physically Abused: No   Sexually Abused: No   Family History  Problem Relation Age of Onset   Jaundice Mother    Cancer Mother        breast   Breast cancer Mother    COPD Father    Heart disease Father        triple bypass   Heart failure Father    Diabetes Father    Thyroid disease Daughter    Allergies Daughter    Alcohol abuse Son    Congenital heart disease Daughter     Objective: Office vital signs reviewed. BP 140/82   Pulse 71   Temp 97.9 F (36.6 C)   Ht 5\' 1"  (1.549 m)   Wt 170 lb 3.2 oz (77.2 kg)   SpO2 98%   BMI 32.16 kg/m   Physical Examination:  General: Awake, alert, well nourished, No acute distress HEENT: Normal; sclera white.  No exophthalmos.  No goiter.  No carotid bruits Cardio: regular rate and rhythm, S1S2 heard, no murmurs appreciated Pulm: clear to auscultation bilaterally, no wheezes, rhonchi or rales; normal work of breathing on room air Extremities: warm, well perfused, No edema, cyanosis or clubbing; +2  pulses bilaterally Neuro: No tremor  Assessment/ Plan: 75 y.o. female   Controlled type 2 diabetes mellitus with other specified complication, without long-term current use of insulin ([place]) - Plan: Bayer DCA Hb A1c Waived  Hyperlipidemia associated with type 2 diabetes mellitus ([place])  Hypertension associated with diabetes ([place])  Hypothyroidism due to acquired atrophy of thyroid - Plan: TSH, T4, free  Sugar under excellent control in fact I think it is too low for patient, particularly given what sounds to be a hypoglycemic episode recently.  Discontinue metformin.  Continue to make good decisions  with low carbs in the diet.  We will reconvene again in the next 3 to 4 months for recheck  Not due for fasting lipid  Blood pressure controlled for age  Asymptomatic from a thyroid standpoint.  Check TSH, free T4  No orders of the defined types were placed in this encounter.  No orders of the defined types were placed in this encounter.  Medications Discontinued During This Encounter  Medication Reason   metFORMIN (GLUCOPHAGE) 500 MG tablet Discontinued by provider     Vanessa Hubbard, [place] [phone]

## 2021-10-11 LAB — T4, FREE: Free T4: 1.53 ng/dL (ref 0.82–1.77)

## 2021-10-11 LAB — TSH: TSH: 2.8 u[IU]/mL (ref 0.450–4.500)

## 2021-10-13 LAB — COLOGUARD: COLOGUARD: POSITIVE — AB

## 2021-10-14 ENCOUNTER — Other Ambulatory Visit: Payer: Self-pay | Admitting: Family Medicine

## 2021-10-14 DIAGNOSIS — R195 Other fecal abnormalities: Secondary | ICD-10-CM

## 2022-02-05 ENCOUNTER — Other Ambulatory Visit: Payer: Self-pay | Admitting: Family Medicine

## 2022-02-05 DIAGNOSIS — E1169 Type 2 diabetes mellitus with other specified complication: Secondary | ICD-10-CM

## 2022-02-09 ENCOUNTER — Ambulatory Visit (INDEPENDENT_AMBULATORY_CARE_PROVIDER_SITE_OTHER): Payer: Medicare Other | Admitting: Family Medicine

## 2022-02-09 ENCOUNTER — Encounter: Payer: Self-pay | Admitting: Family Medicine

## 2022-02-09 VITALS — BP 134/80 | HR 71 | Temp 97.5°F | Ht 61.0 in | Wt 172.8 lb

## 2022-02-09 DIAGNOSIS — E1169 Type 2 diabetes mellitus with other specified complication: Secondary | ICD-10-CM

## 2022-02-09 DIAGNOSIS — I152 Hypertension secondary to endocrine disorders: Secondary | ICD-10-CM

## 2022-02-09 DIAGNOSIS — E034 Atrophy of thyroid (acquired): Secondary | ICD-10-CM | POA: Diagnosis not present

## 2022-02-09 DIAGNOSIS — M85852 Other specified disorders of bone density and structure, left thigh: Secondary | ICD-10-CM | POA: Diagnosis not present

## 2022-02-09 DIAGNOSIS — E785 Hyperlipidemia, unspecified: Secondary | ICD-10-CM | POA: Diagnosis not present

## 2022-02-09 DIAGNOSIS — E1159 Type 2 diabetes mellitus with other circulatory complications: Secondary | ICD-10-CM

## 2022-02-09 LAB — BAYER DCA HB A1C WAIVED: HB A1C (BAYER DCA - WAIVED): 6.1 % — ABNORMAL HIGH (ref 4.8–5.6)

## 2022-02-09 NOTE — Progress Notes (Signed)
? ?Subjective: ?CC:DM ?PCP: [name], DO ?QBH:ALPFXT B Jetter is a 76 y.o. female presenting to clinic today for: ? ?1. Type 2 Diabetes with hypertension, hyperlipidemia:  ?Reports that she has been doing well.  Sometimes doesn't follow a strict diet.  Her DM has been diet controlled recently.  Compliant with crestory, HCTZ. ? ?Last eye exam: UTD ?Last foot exam: UTD ?Last A1c:  ?Lab Results  ?Component Value Date  ? HGBA1C 5.5 10/10/2021  ? ?Nephropathy screen indicated?: UTD ?Last flu, zoster and/or pneumovax:  ?Immunization History  ?Administered Date(s) Administered  ? Fluad Quad(high Dose 65+) 07/21/2019, 07/30/2020, 08/06/2021  ? Influenza, High Dose Seasonal PF 09/08/2017, 09/05/2018  ? Influenza,inj,Quad PF,6+ Mos 09/08/2013, 08/30/2014, 08/27/2015, 09/02/2016  ? Moderna Sars-Covid-2 Vaccination 12/21/2019, 01/19/2020, 10/24/2020, 08/21/2021  ? Pneumococcal Conjugate-13 08/28/2015  ? Pneumococcal Polysaccharide-23 06/10/2011  ? Tdap 07/07/2011  ? Zoster, Live 01/03/2014  ? ? ?ROS: no dizziness, LOC, polyuria, polydipsia, unintended weight loss/gain, foot ulcerations, numbness or tingling in extremities, shortness of breath or chest pain. ? ?2. Hypothyroidism ?Compliant with synthroid. No palpitations or change in BMs reported ? ?3. Osteopenia ?Last DEXA was in 2021 and showed -1.1 T score of the left femur. Ok to get DEXA when due. No bony pain reported.  She takes vit d daily. ? ? ?ROS: Per HPI ? ?No Known Allergies ?Past Medical History:  ?Diagnosis Date  ? Broken ankle left  ? Diabetes mellitus without complication ([place])   ? Fibroids   ? History of fractured kneecap   ? Hyperlipidemia   ? Hypertension   ? Osteopenia   ? Thyroid disease   ? ? ?Current Outpatient Medications:  ?  aspirin 81 MG tablet, Take 81 mg by mouth 2 (two) times a week. , Disp: , Rfl:  ?  B-D ULTRA-FINE 33 LANCETS MISC, Use to check BG daily (Dx 11.9 type 2 DM), Disp: 100 each, Rfl: 1 ?  cetirizine (ZYRTEC) 10 MG tablet,  Take 1 tablet (10 mg total) by mouth daily as needed. Allergies., Disp: 90 tablet, Rfl: 1 ?  Cholecalciferol (VITAMIN D3) 2000 UNITS TABS, Take 1 tablet by mouth., Disp: , Rfl:  ?  diclofenac Sodium (VOLTAREN) 1 % GEL, Apply 2 g topically 4 (four) times daily., Disp: 20 g, Rfl: 0 ?  fluticasone (FLONASE) 50 MCG/ACT nasal spray, Place 2 sprays into both nostrils daily., Disp: 16 g, Rfl: 6 ?  glucose blood (BAYER CONTOUR NEXT TEST) test strip, Use to check BG once a day. Dx 11.9 - type 2 diabetes, Disp: 50 each, Rfl: 2 ?  hydrochlorothiazide (HYDRODIURIL) 25 MG tablet, Take 1 tablet (25 mg total) by mouth daily., Disp: 90 tablet, Rfl: 3 ?  levothyroxine (SYNTHROID) 100 MCG tablet, Take 1 tablet (100 mcg total) by mouth daily. Except 1/2 on Sunday, Disp: 90 tablet, Rfl: 3 ?  Multiple Vitamin (MULTIVITAMIN) tablet, Take 1 tablet by mouth daily., Disp: , Rfl:  ?  rosuvastatin (CRESTOR) 10 MG tablet, TAKE 1/2 TABLET BY MOUTH DAILY, Disp: 45 tablet, Rfl: 3 ?Social History  ? ?Socioeconomic History  ? Marital status: Married  ?  Spouse name: Jeneen Rinks "Clair Gulling" [place]   ? Number of children: 3  ? Years of education: GED  ? Highest education level: GED or equivalent  ?Occupational History  ? Occupation: Retired  ?  Employer: [employer]  ?  Comment: Research and development  ?Tobacco Use  ? Smoking status: Never  ? Smokeless tobacco: Never  ?Vaping Use  ? Vaping Use:  Never used  ?Substance and Sexual Activity  ? Alcohol use: No  ? Drug use: No  ? Sexual activity: Not Currently  ?Other Topics Concern  ? Not on file  ?Social History Narrative  ? Lives with her husband  ? ?Social Determinants of Health  ? ?Financial Resource Strain: Low Risk   ? Difficulty of Paying Living Expenses: Not hard at all  ?Food Insecurity: No Food Insecurity  ? Worried About Charity fundraiser in the Last Year: Never true  ? Ran Out of Food in the Last Year: Never true  ?Transportation Needs: No Transportation Needs  ? Lack of Transportation (Medical): No  ? Lack  of Transportation (Non-Medical): No  ?Physical Activity: Insufficiently Active  ? Days of Exercise per Week: 4 days  ? Minutes of Exercise per Session: 20 min  ?Stress: No Stress Concern Present  ? Feeling of Stress : Not at all  ?Social Connections: Socially Integrated  ? Frequency of Communication with Friends and Family: More than three times a week  ? Frequency of Social Gatherings with Friends and Family: More than three times a week  ? Attends Religious Services: More than 4 times per year  ? Active Member of Clubs or Organizations: Yes  ? Attends Archivist Meetings: More than 4 times per year  ? Marital Status: Married  ?Intimate Partner Violence: Not At Risk  ? Fear of Current or Ex-Partner: No  ? Emotionally Abused: No  ? Physically Abused: No  ? Sexually Abused: No  ? ?Family History  ?Problem Relation Age of Onset  ? Jaundice Mother   ? Cancer Mother   ?     breast  ? Breast cancer Mother   ? COPD Father   ? Heart disease Father   ?     triple bypass  ? Heart failure Father   ? Diabetes Father   ? Thyroid disease Daughter   ? Allergies Daughter   ? Alcohol abuse Son   ? Congenital heart disease Daughter   ? ? ?Objective: ?Office vital signs reviewed. ?Ht '5\' 1"'$  (1.549 m)   Wt 172 lb 12.8 oz (78.4 kg)   BMI 32.65 kg/m?  ? ?Physical Examination:  ?General: Awake, alert, well nourished, No acute distress ?HEENT: sclera white, MMM. No exophthalmos, no goiter ?Cardio: regular rate and rhythm, S1S2 heard, no murmurs appreciated ?Pulm: clear to auscultation bilaterally, no wheezes, rhonchi or rales; normal work of breathing on room air ?Extremities: warm, well perfused, No edema, cyanosis or clubbing; +2 pulses bilaterally ?MSK: normal gait and station ? ?Assessment/ Plan: ?76 y.o. female  ? ?Controlled type 2 diabetes mellitus with other specified complication, without long-term current use of insulin ([place]) - Plan: Bayer DCA Hb A1c Waived ? ?Hyperlipidemia associated with type 2 diabetes mellitus  ([place]) ? ?Hypertension associated with diabetes ([place]) ? ?Hypothyroidism due to acquired atrophy of thyroid - Plan: TSH, T4, free ? ?Osteopenia of neck of left femur - Plan: DG [facility] DEXA ?  ?DM remains diet controlled with A1c 6.1 today. No changes ? ?Not due for fasting lipid yet. Continue statin ? ?Bp well controlled. No changes. No urine micro due yet ? ?Asymptomatic from thyroid standpoint. Continue current regimen. Repeat TSH, FT4 ? ?Dexa ordered to f/u osteopenia. ? ?Orders Placed This Encounter  ?Procedures  ? TSH  ? Bayer DCA Hb A1c Waived  ? T4, free  ? ?No orders of the defined types were placed in this encounter. ? ?F.u in 6  month for PE w/ fasting labs ? ?[name], DO ?[place] ?[phone] ? ?

## 2022-02-10 LAB — T4, FREE: Free T4: 1.42 ng/dL (ref 0.82–1.77)

## 2022-02-10 LAB — TSH: TSH: 2.76 u[IU]/mL (ref 0.450–4.500)

## 2022-02-17 ENCOUNTER — Ambulatory Visit (INDEPENDENT_AMBULATORY_CARE_PROVIDER_SITE_OTHER): Payer: Medicare Other

## 2022-02-17 DIAGNOSIS — M8588 Other specified disorders of bone density and structure, other site: Secondary | ICD-10-CM | POA: Diagnosis not present

## 2022-02-17 DIAGNOSIS — M85852 Other specified disorders of bone density and structure, left thigh: Secondary | ICD-10-CM

## 2022-02-18 DIAGNOSIS — Z780 Asymptomatic menopausal state: Secondary | ICD-10-CM | POA: Diagnosis not present

## 2022-02-18 DIAGNOSIS — M8589 Other specified disorders of bone density and structure, multiple sites: Secondary | ICD-10-CM | POA: Diagnosis not present

## 2022-02-19 ENCOUNTER — Encounter: Payer: Self-pay | Admitting: Family Medicine

## 2022-03-01 ENCOUNTER — Encounter: Payer: Self-pay | Admitting: Family Medicine

## 2022-03-16 ENCOUNTER — Ambulatory Visit (INDEPENDENT_AMBULATORY_CARE_PROVIDER_SITE_OTHER): Payer: Medicare Other

## 2022-03-16 VITALS — Wt 172.0 lb

## 2022-03-16 DIAGNOSIS — Z0000 Encounter for general adult medical examination without abnormal findings: Secondary | ICD-10-CM | POA: Diagnosis not present

## 2022-03-16 NOTE — Patient Instructions (Signed)
Vanessa Hubbard , ?Thank you for taking time to come for your Medicare Wellness Visit. I appreciate your ongoing commitment to your health goals. Please review the following plan we discussed and let me know if I can assist you in the future.  ? ?Screening recommendations/referrals: ?Colonoscopy: Positive cologuard 10/07/2021 - referred for colonoscopy - call to schedule with [facility] soon ?Mammogram: Done 07/09/2021 - Repeat annually ?Bone Density: Done 02/18/2022 - Repeat every 2 years ?Recommended yearly ophthalmology/optometry visit for glaucoma screening and checkup ?Recommended yearly dental visit for hygiene and checkup ? ?Vaccinations: ?Influenza vaccine: Done 08/06/2021 - Repeat annually ?Pneumococcal vaccine: Done  06/10/2011 & 08/28/2015 ?Tdap vaccine: Done 07/07/2011 - Repeat in 10 years *due ?Shingles vaccine: Zostavax done 2015 - Due for new vaccines - Shingrix is 2 doses 2-6 months apart and over 90% effective     ?Covid-19: Done  12/21/2019, 01/19/2020, 10/24/2020, & 08/21/2021 ? ?Advanced directives: Please bring a copy of your health care power of attorney and living will to the office to be added to your chart at your convenience.  ? ?Conditions/risks identified: Aim for 30 minutes of exercise or brisk walking, 6-8 glasses of water, and 5 servings of fruits and vegetables each day.  ? ?Next appointment: Follow up in one year for your annual wellness visit  ? ? ?Preventive Care 29 Years and Older, Female ?Preventive care refers to lifestyle choices and visits with your health care provider that can promote health and wellness. ?What does preventive care include? ?A yearly physical exam. This is also called an annual well check. ?Dental exams once or twice a year. ?Routine eye exams. Ask your health care provider how often you should have your eyes checked. ?Personal lifestyle choices, including: ?Daily care of your teeth and gums. ?Regular physical activity. ?Eating a healthy diet. ?Avoiding tobacco and drug  use. ?Limiting alcohol use. ?Practicing safe sex. ?Taking low-dose aspirin every day. ?Taking vitamin and mineral supplements as recommended by your health care provider. ?What happens during an annual well check? ?The services and screenings done by your health care provider during your annual well check will depend on your age, overall health, lifestyle risk factors, and family history of disease. ?Counseling  ?Your health care provider may ask you questions about your: ?Alcohol use. ?Tobacco use. ?Drug use. ?Emotional well-being. ?Home and relationship well-being. ?Sexual activity. ?Eating habits. ?History of falls. ?Memory and ability to understand (cognition). ?Work and work Statistician. ?Reproductive health. ?Screening  ?You may have the following tests or measurements: ?Height, weight, and BMI. ?Blood pressure. ?Lipid and cholesterol levels. These may be checked every 5 years, or more frequently if you are over 29 years old. ?Skin check. ?Lung cancer screening. You may have this screening every year starting at age 18 if you have a 30-pack-year history of smoking and currently smoke or have quit within the past 15 years. ?Fecal occult blood test (FOBT) of the stool. You may have this test every year starting at age 74. ?Flexible sigmoidoscopy or colonoscopy. You may have a sigmoidoscopy every 5 years or a colonoscopy every 10 years starting at age 80. ?Hepatitis C blood test. ?Hepatitis B blood test. ?Sexually transmitted disease (STD) testing. ?Diabetes screening. This is done by checking your blood sugar (glucose) after you have not eaten for a while (fasting). You may have this done every 1-3 years. ?Bone density scan. This is done to screen for osteoporosis. You may have this done starting at age 59. ?Mammogram. This may be done every 1-2  years. Talk to your health care provider about how often you should have regular mammograms. ?Talk with your health care provider about your test results, treatment  options, and if necessary, the need for more tests. ?Vaccines  ?Your health care provider may recommend certain vaccines, such as: ?Influenza vaccine. This is recommended every year. ?Tetanus, diphtheria, and acellular pertussis (Tdap, Td) vaccine. You may need a Td booster every 10 years. ?Zoster vaccine. You may need this after age 30. ?Pneumococcal 13-valent conjugate (PCV13) vaccine. One dose is recommended after age 40. ?Pneumococcal polysaccharide (PPSV23) vaccine. One dose is recommended after age 41. ?Talk to your health care provider about which screenings and vaccines you need and how often you need them. ?This information is not intended to replace advice given to you by your health care provider. Make sure you discuss any questions you have with your health care provider. ?Document Released: 11/22/2015 Document Revised: 07/15/2016 Document Reviewed: 08/27/2015 ?Elsevier Interactive Patient Education ? 2017 [place]. ? ?Fall Prevention in the Home ?Falls can cause injuries. They can happen to people of all ages. There are many things you can do to make your home safe and to help prevent falls. ?What can I do on the outside of my home? ?Regularly fix the edges of walkways and driveways and fix any cracks. ?Remove anything that might make you trip as you walk through a door, such as a raised step or threshold. ?Trim any bushes or trees on the path to your home. ?Use bright outdoor lighting. ?Clear any walking paths of anything that might make someone trip, such as rocks or tools. ?Regularly check to see if handrails are loose or broken. Make sure that both sides of any steps have handrails. ?Any raised decks and porches should have guardrails on the edges. ?Have any leaves, snow, or ice cleared regularly. ?Use sand or salt on walking paths during winter. ?Clean up any spills in your garage right away. This includes oil or grease spills. ?What can I do in the bathroom? ?Use night lights. ?Install grab  bars by the toilet and in the tub and shower. Do not use towel bars as grab bars. ?Use non-skid mats or decals in the tub or shower. ?If you need to sit down in the shower, use a plastic, non-slip stool. ?Keep the floor dry. Clean up any water that spills on the floor as soon as it happens. ?Remove soap buildup in the tub or shower regularly. ?Attach bath mats securely with double-sided non-slip rug tape. ?Do not have throw rugs and other things on the floor that can make you trip. ?What can I do in the bedroom? ?Use night lights. ?Make sure that you have a light by your bed that is easy to reach. ?Do not use any sheets or blankets that are too big for your bed. They should not hang down onto the floor. ?Have a firm chair that has side arms. You can use this for support while you get dressed. ?Do not have throw rugs and other things on the floor that can make you trip. ?What can I do in the kitchen? ?Clean up any spills right away. ?Avoid walking on wet floors. ?Keep items that you use a lot in easy-to-reach places. ?If you need to reach something above you, use a strong step stool that has a grab bar. ?Keep electrical cords out of the way. ?Do not use floor polish or wax that makes floors slippery. If you must use wax, use non-skid floor  wax. ?Do not have throw rugs and other things on the floor that can make you trip. ?What can I do with my stairs? ?Do not leave any items on the stairs. ?Make sure that there are handrails on both sides of the stairs and use them. Fix handrails that are broken or loose. Make sure that handrails are as long as the stairways. ?Check any carpeting to make sure that it is firmly attached to the stairs. Fix any carpet that is loose or worn. ?Avoid having throw rugs at the top or bottom of the stairs. If you do have throw rugs, attach them to the floor with carpet tape. ?Make sure that you have a light switch at the top of the stairs and the bottom of the stairs. If you do not have them,  ask someone to add them for you. ?What else can I do to help prevent falls? ?Wear shoes that: ?Do not have high heels. ?Have rubber bottoms. ?Are comfortable and fit you well. ?Are closed at the toe. Do no

## 2022-03-16 NOTE — Progress Notes (Signed)
? ?Subjective:  ? Vanessa Hubbard is a 76 y.o. female who presents for Medicare Annual (Subsequent) preventive examination. ? ?Virtual Visit via Telephone Note ? ?I connected with  Vanessa Hubbard on 03/16/22 at  9:45 AM EDT by telephone and verified that I am speaking with the correct person using two identifiers. ? ?Location: ?Patient: Home ?Provider: [facility] ?Persons participating in the virtual visit: patient/Nurse Health Advisor ?  ?I discussed the limitations, risks, security and privacy concerns of performing an evaluation and management service by telephone and the availability of in person appointments. The patient expressed understanding and agreed to proceed. ? ?Interactive audio and video telecommunications were attempted between this nurse and patient, however failed, due to patient having technical difficulties OR patient did not have access to video capability.  We continued and completed visit with audio only. ? ?Some vital signs may be absent or patient reported.  ? ?Vanessa Pirozzi Dionne Ano, LPN  ? ?Review of Systems    ? ?Cardiac Risk Factors include: advanced age (>30mn, >>80women);diabetes mellitus;dyslipidemia;hypertension;family history of premature cardiovascular disease;obesity (BMI >30kg/m2) ? ?   ?Objective:  ?  ?Today's Vitals  ? 03/16/22 0941  ?Weight: 172 lb (78 kg)  ? ?Body mass index is 32.5 kg/m?. ? ? ?  03/16/2022  ?  9:48 AM 03/13/2021  ?  9:51 AM 03/11/2020  ?  9:33 AM 03/09/2019  ?  9:33 AM 01/31/2018  ?  8:59 AM 01/27/2017  ? 12:01 PM 01/22/2016  ? 10:19 AM  ?Advanced Directives  ?Does Patient Have a Medical Advance Directive? Yes No No No No No No  ?Type of [place]        ?Copy of [place]? No - copy requested        ?Would patient like information on creating a medical advance directive?  No - Patient declined Yes (MAU/Ambulatory/Procedural Areas - Information given) No - Patient declined No - Patient declined Yes  (MAU/Ambulatory/Procedural Areas - Information given) Yes - Educational materials given  ? ? ?Current Medications (verified) ?Outpatient Encounter Medications as of 03/16/2022  ?Medication Sig  ? aspirin 81 MG tablet Take 81 mg by mouth 2 (two) times a week.   ? B-D ULTRA-FINE 33 LANCETS MISC Use to check BG daily (Dx 11.9 type 2 DM)  ? cetirizine (ZYRTEC) 10 MG tablet Take 1 tablet (10 mg total) by mouth daily as needed. Allergies.  ? Cholecalciferol (VITAMIN D3) 2000 UNITS TABS Take 1 tablet by mouth.  ? fluticasone (FLONASE) 50 MCG/ACT nasal spray Place 2 sprays into both nostrils daily.  ? glucose blood (BAYER CONTOUR NEXT TEST) test strip Use to check BG once a day. Dx 11.9 - type 2 diabetes  ? hydrochlorothiazide (HYDRODIURIL) 25 MG tablet Take 1 tablet (25 mg total) by mouth daily.  ? levothyroxine (SYNTHROID) 100 MCG tablet Take 1 tablet (100 mcg total) by mouth daily. Except 1/2 on Sunday  ? Multiple Vitamin (MULTIVITAMIN) tablet Take 1 tablet by mouth daily.  ? rosuvastatin (CRESTOR) 10 MG tablet TAKE 1/2 TABLET BY MOUTH DAILY  ? diclofenac Sodium (VOLTAREN) 1 % GEL Apply 2 g topically 4 (four) times daily. (Patient not taking: Reported on 03/16/2022)  ? ?No facility-administered encounter medications on file as of 03/16/2022.  ? ? ?Allergies (verified) ?Patient has no known allergies.  ? ?History: ?Past Medical History:  ?Diagnosis Date  ? Broken ankle left  ? Diabetes mellitus without complication ([place]   ?  Fibroids   ? History of fractured kneecap   ? Hyperlipidemia   ? Hypertension   ? Osteopenia   ? Thyroid disease   ? ?Past Surgical History:  ?Procedure Laterality Date  ? ABDOMINAL HYSTERECTOMY    ? complete   ? ANKLE SURGERY    ? BREAST BIOPSY Right 1987  ? benign  ? BREAST LUMPECTOMY Right   ? benign  ? CHOLECYSTECTOMY    ? KNEE SURGERY Right   ? ORIF PATELLA  01/22/2012  ? Procedure: OPEN REDUCTION INTERNAL (ORIF) FIXATION PATELLA;  Surgeon: Meredith Pel, MD;  Location: [facility];  Service:  Orthopedics;  Laterality: Left;  ? TONSILLECTOMY    ? ?Family History  ?Problem Relation Age of Onset  ? Jaundice Mother   ? Cancer Mother   ?     breast  ? Breast cancer Mother   ? COPD Father   ? Heart disease Father   ?     triple bypass  ? Heart failure Father   ? Diabetes Father   ? Thyroid disease Daughter   ? Allergies Daughter   ? Alcohol abuse Son   ? Congenital heart disease Daughter   ? ?Social History  ? ?Socioeconomic History  ? Marital status: Married  ?  Spouse name: Jeneen Rinks "Clair Gulling" Eddie   ? Number of children: 3  ? Years of education: GED  ? Highest education level: GED or equivalent  ?Occupational History  ? Occupation: Retired  ?  Employer: [employer]  ?  Comment: Research and development  ?Tobacco Use  ? Smoking status: Never  ? Smokeless tobacco: Never  ?Vaping Use  ? Vaping Use: Never used  ?Substance and Sexual Activity  ? Alcohol use: No  ? Drug use: No  ? Sexual activity: Not Currently  ?Other Topics Concern  ? Not on file  ?Social History Narrative  ? Lives with her husband in one level home  ? ?Social Determinants of Health  ? ?Financial Resource Strain: Low Risk   ? Difficulty of Paying Living Expenses: Not hard at all  ?Food Insecurity: No Food Insecurity  ? Worried About Charity fundraiser in the Last Year: Never true  ? Ran Out of Food in the Last Year: Never true  ?Transportation Needs: No Transportation Needs  ? Lack of Transportation (Medical): No  ? Lack of Transportation (Non-Medical): No  ?Physical Activity: Sufficiently Active  ? Days of Exercise per Week: 5 days  ? Minutes of Exercise per Session: 30 min  ?Stress: No Stress Concern Present  ? Feeling of Stress : Not at all  ?Social Connections: Socially Integrated  ? Frequency of Communication with Friends and Family: More than three times a week  ? Frequency of Social Gatherings with Friends and Family: More than three times a week  ? Attends Religious Services: More than 4 times per year  ? Active Member of Clubs or Organizations:  Yes  ? Attends Archivist Meetings: More than 4 times per year  ? Marital Status: Married  ? ? ?Tobacco Counseling ?Counseling given: Not Answered ? ? ?Clinical Intake: ? ?Pre-visit preparation completed: Yes ? ?Pain : No/denies pain ? ?  ? ?BMI - recorded: 32.5 ?Nutritional Status: BMI > 30  Obese ?Nutritional Risks: None ?Diabetes: Yes ?CBG done?: No ?Did pt. bring in CBG monitor from home?: No ? ?How often do you need to have someone help you when you read instructions, pamphlets, or other written materials from your doctor or  pharmacy?: 1 - Never ? ?Diabetic? Nutrition Risk Assessment: ? ?Has the patient had any N/V/D within the last 2 months?  No  ?Does the patient have any non-healing wounds?  No  ?Has the patient had any unintentional weight loss or weight gain?  No  ? ?Diabetes: ? ?Is the patient diabetic?   Yes, but borderline - no longer being treated, just monitored ?If diabetic, was a CBG obtained today?  No  ?Did the patient bring in their glucometer from home?  No  ?How often do you monitor your CBG's? Only a few times per month - on average fasting sugar is 127.  ? ?Financial Strains and Diabetes Management: ? ?Are you having any financial strains with the device, your supplies or your medication? No .  ?Does the patient want to be seen by Chronic Care Management for management of their diabetes?  No  ?Would the patient like to be referred to a Nutritionist or for Diabetic Management?  No  ? ?Diabetic Exams: ? ?Diabetic Eye Exam: Completed 12/2021.  ? ?Diabetic Foot Exam: Completed 04/01/2021. Pt has been advised about the importance in completing this exam. Pt is scheduled for diabetic foot exam on next appt with Dr Lajuana Ripple in August 2023.   ? ?Interpreter Needed?: No ? ?Information entered by :: Elayah Klooster, LPN ? ? ?Activities of Daily Living ? ?  03/16/2022  ?  9:56 AM  ?In your present state of health, do you have any difficulty performing the following activities:  ?Hearing? 0  ?Vision?  0  ?Difficulty concentrating or making decisions? 0  ?Walking or climbing stairs? 0  ?Dressing or bathing? 0  ?Doing errands, shopping? 0  ?Preparing Food and eating ? N  ?Using the Toilet? N  ?In the past six mont

## 2022-05-29 ENCOUNTER — Other Ambulatory Visit: Payer: Self-pay | Admitting: Family Medicine

## 2022-05-29 DIAGNOSIS — Z1231 Encounter for screening mammogram for malignant neoplasm of breast: Secondary | ICD-10-CM

## 2022-06-01 ENCOUNTER — Ambulatory Visit (INDEPENDENT_AMBULATORY_CARE_PROVIDER_SITE_OTHER): Payer: Medicare Other | Admitting: Family Medicine

## 2022-06-01 ENCOUNTER — Encounter: Payer: Self-pay | Admitting: Family Medicine

## 2022-06-01 VITALS — BP 134/71 | HR 73 | Temp 98.2°F | Ht 61.0 in | Wt 174.2 lb

## 2022-06-01 DIAGNOSIS — R6884 Jaw pain: Secondary | ICD-10-CM | POA: Diagnosis not present

## 2022-06-01 MED ORDER — MELOXICAM 7.5 MG PO TABS: 7.5000 mg | ORAL_TABLET | Freq: Every day | ORAL | 0 refills | Status: AC

## 2022-06-01 NOTE — Progress Notes (Signed)
   Acute Office Visit  Subjective:     Patient ID: Vanessa Hubbard, female    DOB: Dec 31, 1945, 76 y.o.   MRN: [id number]  Chief Complaint  Patient presents with   Facial Pain    HPI Patient is in today for pain in her right upper and lower jaw that extends to her right ear. This started 2 days ago. The pain is intermittent. The pain is sometimes sharp and shooting and sometimes dull and achy. The pain last for a few minutes to hours. Cold water makes the pain worse. The pain is mild to moderate. She has tried tylenol and heat and it has helped. She does wear a night guard and she did not wear it the night before this started. Denies cough, congestion, fever, ear drainage, sore throat, or tooth pain.   ROS As per HPI.      Objective:    BP 134/71   Pulse 73   Temp 98.2 F (36.8 C) (Temporal)   Ht '5\' 1"'$  (1.549 m)   Wt 174 lb 4 oz (79 kg)   SpO2 95%   BMI 32.92 kg/m    Physical Exam Vitals and nursing note reviewed.  Constitutional:      General: She is not in acute distress.    Appearance: She is not ill-appearing, toxic-appearing or diaphoretic.  HENT:     Head:     Jaw: Tenderness and pain on movement present. No trismus, swelling or malocclusion.     Right Ear: Tympanic membrane, ear canal and external ear normal.     Left Ear: Tympanic membrane, ear canal and external ear normal.     Nose: Nose normal.     Right Sinus: No maxillary sinus tenderness or frontal sinus tenderness.     Left Sinus: No maxillary sinus tenderness or frontal sinus tenderness.     Mouth/Throat:     Mouth: Mucous membranes are moist. No oral lesions.     Dentition: Normal dentition. No dental tenderness, gingival swelling, dental caries or dental abscesses.  Cardiovascular:     Rate and Rhythm: Normal rate and regular rhythm.  Pulmonary:     Effort: Pulmonary effort is normal. No respiratory distress.  Skin:    General: Skin is warm and dry.  Neurological:     General: No focal deficit  present.     Mental Status: She is alert and oriented to person, place, and time.  Psychiatric:        Mood and Affect: Mood normal.        Behavior: Behavior normal.     No results found for any visits on 06/01/22.      Assessment & Plan:   Vanessa Hubbard was seen today for facial pain.  Diagnoses and all orders for this visit:  Jaw pain Consistent with TMJ pain from grinding/gritting teeth. Last GFR was normal and she does not have major cardiac history. Will do mobic daily for 10 days. Can continue heat. Discussed compliance with night guard. Return to office for new or worsening symptoms, or if symptoms persist.  -     meloxicam (MOBIC) 7.5 MG tablet; Take 1 tablet (7.5 mg total) by mouth daily for 10 days.  The patient indicates understanding of these issues and agrees with the plan.  Gwenlyn Perking, FNP

## 2022-06-01 NOTE — Patient Instructions (Signed)
Jaw Range of Motion Exercises Jaw range of motion exercises are exercises that help your jaw move better. Exercises that help you have good posture (postural exercises) also help relieve jaw discomfort. These are often done along with range of motion exercises. These exercises can help prevent or improve: Trouble opening your mouth. Pain in your jaw while it is open or closed. Temporomandibular joint (TMJ) pain. Headache caused by jaw tension. Take other actions to prevent or relieve jaw pain, such as: Avoiding things that cause or increase jaw pain. This may include: Chewing gum or eating hard foods. Clenching your jaw or teeth, grinding your teeth, or keeping tension in your jaw muscles. Opening your mouth wide, such as for a big yawn. Leaning on your jaw, such as resting your jaw in your hand while leaning on a desk. Putting ice on your jaw. To do this: Put ice in a plastic bag. Place a towel between your skin and the bag. Leave the ice on for 20 minutes, 2-3 times a day. Remove the ice if your skin turns bright red. This is very important. If you cannot feel pain, heat, or cold, you have a greater risk of damage to the area. Only do jaw exercises that your health care provider recommends. Only move your jaw as far as it can comfortably go in each direction. Do not move your jaw into positions that cause pain. Range of motion exercises Repeat each of these exercises 8 times, 1-2 times a day, or as told by your health care provider. Exercise A: Forward protrusion  Push your jaw forward. Hold this position for 1-2 seconds. Let your jaw return to its normal position and rest it there for 1-2 seconds. Exercise B: Controlled opening  Stand or sit in front of a mirror. Place your tongue on the roof of your mouth, just behind your top teeth. Keeping your tongue on the roof of your mouth, slowly open and close your mouth. While you open and close your mouth, watch your jaw in the mirror. Try  to keep your jaw from moving to one side or the other. Exercise C: Right and left motion  Move your jaw right. Hold this position for 1-2 seconds. Let your jaw return to its normal position, and rest it there for 1-2 seconds. Move your jaw left. Hold this position for 1-2 seconds. Let your jaw return to its normal position, and rest it there for 1-2 seconds. Postural exercises Exercise A: Chin tucks  You can do this exercise sitting, standing, or lying down. Move your head straight back, keeping your head level. You can guide the movement by placing your fingers on your chin to push your jaw back in an even motion. You should be able to feel a double chin form at the end of the motion. Hold this position for 5 seconds. Repeat 10-15 times. Exercise B: Shoulder blade squeeze  Sit or stand. Bend your elbows to about 90 degrees, which is the shape of a capital letter "L." Keep your upper arms by your body. Squeeze your shoulder blades down and back, as though you were trying to touch your elbows behind you. Do not shrug your shoulders or move your head. Hold this position for 5 seconds. Repeat 10-15 times. Exercise C: Chest stretch  Stand in a doorway with one of your feet slightly in front of the other. This is called a staggered stance. If you cannot reach your forearms to the door frame, do this exercise in  a corner of a room. Put both of your hands and your forearms on the door frame, with your arms wide apart. Make sure your arms are at a 90-degree angle to your body. Place your hands on the door frame at the height of your elbows. Slowly move your weight onto your front foot until you feel a stretch across your chest and in the front of your shoulders. Keep your head and chest upright and keep your abdominal muscles tight. Do not lean in. Hold this position for 30 seconds. Repeat 3 times. Contact a health care provider if: You have jaw pain that is new or gets worse. You have clicking or  popping sounds while doing the exercises. Get help right away if: Your jaw is stuck in one place and you cannot move it. You cannot open or close your mouth. Summary Jaw range of motion exercises are exercises that help your jaw move better. Take actions to prevent or relieve jaw pain: limit chewing gum or eating hard foods; clenching your jaw or teeth; or leaning on your jaw, such as resting your jaw in your hand while leaning on a desk. Repeat each of the jaw range of motion exercises 8 times, 1-2 times a day, or as told by your health care provider. Contact a health care provider if you have clicking or popping sounds while doing the exercises. This information is not intended to replace advice given to you by your health care provider. Make sure you discuss any questions you have with your health care provider. Document Revised: 06/08/2021 Document Reviewed: 06/08/2021 Elsevier Patient Education  [place].

## 2022-06-12 ENCOUNTER — Ambulatory Visit (INDEPENDENT_AMBULATORY_CARE_PROVIDER_SITE_OTHER): Payer: Medicare Other | Admitting: Family Medicine

## 2022-06-12 ENCOUNTER — Encounter: Payer: Self-pay | Admitting: Family Medicine

## 2022-06-12 VITALS — BP 131/65 | HR 72 | Temp 97.9°F | Ht 61.0 in | Wt 174.8 lb

## 2022-06-12 DIAGNOSIS — E1169 Type 2 diabetes mellitus with other specified complication: Secondary | ICD-10-CM

## 2022-06-12 DIAGNOSIS — E034 Atrophy of thyroid (acquired): Secondary | ICD-10-CM

## 2022-06-12 DIAGNOSIS — E785 Hyperlipidemia, unspecified: Secondary | ICD-10-CM

## 2022-06-12 DIAGNOSIS — I152 Hypertension secondary to endocrine disorders: Secondary | ICD-10-CM

## 2022-06-12 DIAGNOSIS — E1159 Type 2 diabetes mellitus with other circulatory complications: Secondary | ICD-10-CM

## 2022-06-12 DIAGNOSIS — M85852 Other specified disorders of bone density and structure, left thigh: Secondary | ICD-10-CM

## 2022-06-12 LAB — BAYER DCA HB A1C WAIVED: HB A1C (BAYER DCA - WAIVED): 6.2 % — ABNORMAL HIGH (ref 4.8–5.6)

## 2022-06-12 MED ORDER — HYDROCHLOROTHIAZIDE 25 MG PO TABS: 25.0000 mg | ORAL_TABLET | Freq: Every day | ORAL | 3 refills | Status: DC

## 2022-06-12 MED ORDER — LEVOTHYROXINE SODIUM 100 MCG PO TABS: 100.0000 ug | ORAL_TABLET | Freq: Every day | ORAL | 3 refills | Status: DC

## 2022-06-12 NOTE — Patient Instructions (Signed)
Sugar looks great Thyroid levels will be back by Monday We talked about eating adequate dietary calcium and vitamin d and I gave you a handout about this today.

## 2022-06-12 NOTE — Progress Notes (Signed)
Subjective: CC:DM PCP: [name], DO JTT:[id number] Vanessa Hubbard is a 76 y.o. female presenting to clinic today for:  1. Type 2 Diabetes with hypertension, hyperlipidemia:  She reports compliance with medications.  No hypoglycemic episodes reported.  She admits that she eats a biscuit every time she goes out to breakfast with her husband as she does not enjoy eggs out.  Last eye exam: Up-to-date Last foot exam: Up-to-date Last A1c:  Lab Results  Component Value Date   HGBA1C 6.1 (H) 02/09/2022   Nephropathy screen indicated?:  On ACE inhibitor Last flu, zoster and/or pneumovax:  Immunization History  Administered Date(s) Administered   Fluad Quad(high Dose 65+) 07/21/2019, 07/30/2020, 08/06/2021   Influenza, High Dose Seasonal PF 09/08/2017, 09/05/2018   Influenza,inj,Quad PF,6+ Mos 09/08/2013, 08/30/2014, 08/27/2015, 09/02/2016   Moderna Sars-Covid-2 Vaccination 12/21/2019, 01/19/2020, 10/24/2020, 08/21/2021   Pneumococcal Conjugate-13 08/28/2015   Pneumococcal Polysaccharide-23 06/10/2011   Tdap 07/07/2011   Zoster, Live 01/03/2014    ROS: No chest pain, shortness of breath, dizziness or edema reported.  Has some chronic numbness of the left digits 4 and 5 on the foot due to previous ankle surgery    ROS: Per HPI  No Known Allergies Past Medical History:  Diagnosis Date   Broken ankle left   Diabetes mellitus without complication ([place])    Fibroids    History of fractured kneecap    Hyperlipidemia    Hypertension    Osteopenia    Thyroid disease     Current Outpatient Medications:    aspirin 81 MG tablet, Take 81 mg by mouth 2 (two) times a week. , Disp: , Rfl:    Vanessa-D ULTRA-FINE 33 LANCETS MISC, Use to check BG daily (Dx 11.9 type 2 DM), Disp: 100 each, Rfl: 1   cetirizine (ZYRTEC) 10 MG tablet, Take 1 tablet (10 mg total) by mouth daily as needed. Allergies., Disp: 90 tablet, Rfl: 1   Cholecalciferol (VITAMIN D3) 2000 UNITS TABS, Take 1 tablet by mouth.,  Disp: , Rfl:    diclofenac Sodium (VOLTAREN) 1 % GEL, Apply 2 g topically 4 (four) times daily., Disp: 20 g, Rfl: 0   fluticasone (FLONASE) 50 MCG/ACT nasal spray, Place 2 sprays into both nostrils daily., Disp: 16 g, Rfl: 6   glucose blood (BAYER CONTOUR NEXT TEST) test strip, Use to check BG once a day. Dx 11.9 - type 2 diabetes, Disp: 50 each, Rfl: 2   hydrochlorothiazide (HYDRODIURIL) 25 MG tablet, Take 1 tablet (25 mg total) by mouth daily., Disp: 90 tablet, Rfl: 3   levothyroxine (SYNTHROID) 100 MCG tablet, Take 1 tablet (100 mcg total) by mouth daily. Except 1/2 on Sunday, Disp: 90 tablet, Rfl: 3   Multiple Vitamin (MULTIVITAMIN) tablet, Take 1 tablet by mouth daily., Disp: , Rfl:    rosuvastatin (CRESTOR) 10 MG tablet, TAKE 1/2 TABLET BY MOUTH DAILY, Disp: 45 tablet, Rfl: 3 Social History   Socioeconomic History   Marital status: Married    Spouse name: Vanessa Hubbard    Number of children: 3   Years of education: GED   Highest education level: GED or equivalent  Occupational History   Occupation: Retired    Fish farm manager: [employer]    Comment: Research and development  Tobacco Use   Smoking status: Never   Smokeless tobacco: Never  Vaping Use   Vaping Use: Never used  Substance and Sexual Activity   Alcohol use: No   Drug use: No   Sexual activity: Not Currently  Other Topics Concern   Not on file  Social History Narrative   Lives with her husband in one level home   Social Determinants of Health   Financial Resource Strain: Low Risk  (03/16/2022)   Overall Financial Resource Strain (CARDIA)    Difficulty of Paying Living Expenses: Not hard at all  Food Insecurity: No Food Insecurity (03/16/2022)   Hunger Vital Sign    Worried About Running Out of Food in the Last Year: Never true    Ran Out of Food in the Last Year: Never true  Transportation Needs: No Transportation Needs (03/16/2022)   PRAPARE - Hydrologist (Medical): No    Lack of  Transportation (Non-Medical): No  Physical Activity: Sufficiently Active (03/16/2022)   Exercise Vital Sign    Days of Exercise per Week: 5 days    Minutes of Exercise per Session: 30 min  Stress: No Stress Concern Present (03/16/2022)   [place]    Feeling of Stress : Not at all  Social Connections: Socially Integrated (03/16/2022)   Social Connection and Isolation Panel [NHANES]    Frequency of Communication with Friends and Family: More than three times a week    Frequency of Social Gatherings with Friends and Family: More than three times a week    Attends Religious Services: More than 4 times per year    Active Member of [employer] or Organizations: Yes    Attends Music therapist: More than 4 times per year    Marital Status: Married  Human resources officer Violence: Not At Risk (03/16/2022)   Humiliation, Afraid, Rape, and Kick questionnaire    Fear of Current or Ex-Partner: No    Emotionally Abused: No    Physically Abused: No    Sexually Abused: No   Family History  Problem Relation Age of Onset   Jaundice Mother    Cancer Mother        breast   Breast cancer Mother    COPD Father    Heart disease Father        triple bypass   Heart failure Father    Diabetes Father    Thyroid disease Daughter    Allergies Daughter    Alcohol abuse Son    Congenital heart disease Daughter     Objective: Office vital signs reviewed. BP 131/65   Pulse 72   Temp 97.9 F (36.6 C)   Ht '5\' 1"'$  (1.549 m)   Wt 174 lb 12.8 oz (79.3 kg)   SpO2 96%   BMI 33.03 kg/m   Physical Examination:  General: Awake, alert, well nourished, No acute distress HEENT: Sclera white.  Moist mucous membranes.  No exophthalmos Cardio: regular rate and rhythm, S1S2 heard, no murmurs appreciated Pulm: clear to auscultation bilaterally, no wheezes, rhonchi or rales; normal work of breathing on room air Extremities: warm, well perfused, No  edema, cyanosis or clubbing; +2 pulses bilaterally MSK: Ambulating independently with normal gait and station Neuro: see DM foot Diabetic Foot Exam - Simple   Simple Foot Form Diabetic Foot exam was performed with the following findings: Yes 06/12/2022 10:10 AM  Visual Inspection See comments: Yes Sensation Testing Intact to touch and monofilament testing bilaterally: Yes See comments: Yes Pulse Check Posterior Tibialis and Dorsalis pulse intact bilaterally: Yes Comments She has slightly dulled monofilament sensation in the left 2 toes, digits 4 and 5 but it is present.  She has onychomycotic  changes to the nails bilaterally      Assessment/ Plan: 76 y.o. female   Controlled type 2 diabetes mellitus with other specified complication, without long-term current use of insulin (HCC) - Plan: Bayer DCA Hb A1c Waived, Microalbumin / creatinine urine ratio  Hyperlipidemia associated with type 2 diabetes mellitus ([place])  Hypertension associated with diabetes ([place]) - Plan: hydrochlorothiazide (HYDRODIURIL) 25 MG tablet  Hypothyroidism due to acquired atrophy of thyroid - Plan: TSH, T4, free, levothyroxine (SYNTHROID) 100 MCG tablet  Osteopenia of neck of left femur  Sugar remains under good control.  No changes.  Urine microalbumin collected  Not yet due for fasting lipid.  Continue statin  Blood pressure is controlled.  No changes  Asymptomatic from a thyroid standpoint.  Check thyroid levels  Handout provided today with dietary sources of calcium and vitamin D given known osteopenia.  Discussed recommendations and I gave her a copy of her DEXA scan today.  Orders Placed This Encounter  Procedures   Bayer DCA Hb A1c Waived   TSH   T4, free   No orders of the defined types were placed in this encounter.    [name], DO [place] [phone]

## 2022-06-13 LAB — T4, FREE: Free T4: 1.35 ng/dL (ref 0.82–1.77)

## 2022-06-13 LAB — TSH: TSH: 3.73 u[IU]/mL (ref 0.450–4.500)

## 2022-06-16 NOTE — Progress Notes (Signed)
Patient returning call. Please call back

## 2022-06-19 ENCOUNTER — Encounter: Payer: Self-pay | Admitting: Family Medicine

## 2022-06-24 ENCOUNTER — Ambulatory Visit (INDEPENDENT_AMBULATORY_CARE_PROVIDER_SITE_OTHER): Payer: Medicare Other | Admitting: Family Medicine

## 2022-06-24 ENCOUNTER — Encounter: Payer: Self-pay | Admitting: Family Medicine

## 2022-06-24 DIAGNOSIS — J0100 Acute maxillary sinusitis, unspecified: Secondary | ICD-10-CM

## 2022-06-24 MED ORDER — PSEUDOEPHEDRINE-GUAIFENESIN ER 60-600 MG PO TB12: 1.0000 | ORAL_TABLET | Freq: Two times a day (BID) | ORAL | 0 refills | Status: AC

## 2022-06-24 MED ORDER — AMOXICILLIN-POT CLAVULANATE 875-125 MG PO TABS: 1.0000 | ORAL_TABLET | Freq: Two times a day (BID) | ORAL | 0 refills | Status: DC

## 2022-06-24 NOTE — Progress Notes (Signed)
Subjective:    Patient ID: Vanessa Hubbard, female    DOB: Aug 21, 1946, 76 y.o.   MRN: [id number]   HPI: Vanessa Hubbard is a 76 y.o. female presenting for scratchy throat. Posterior drainage. No fever. Alkaseltzer and cetirizine helping. Nose running some, but mostly posterior draining. Sinus pressure with HA noted between the eyes, behind the nostrils. Congestion. Onset 2 days ago. Occ. Cough. Sneeing too. Denies fever. Temp. 97.6.      06/12/2022    8:29 AM 06/01/2022   12:32 PM 03/16/2022    9:46 AM 02/09/2022    8:31 AM 10/10/2021    8:34 AM  Depression screen PHQ 2/9  Decreased Interest 0 0 0 0 1  Down, Depressed, Hopeless 0 0 0 0 0  PHQ - 2 Score 0 0 0 0 1  Altered sleeping 0 0   0  Tired, decreased energy 0 0   0  Change in appetite 0 0   1  Feeling bad or failure about yourself  0 0   0  Trouble concentrating 0 0   0  Moving slowly or fidgety/restless 0 0   0  Suicidal thoughts 0 0   0  PHQ-9 Score 0 0   2  Difficult doing work/chores Not difficult at all Not difficult at all   Somewhat difficult     Relevant past medical, surgical, family and social history reviewed and updated as indicated.  Interim medical history since our last visit reviewed. Allergies and medications reviewed and updated.  ROS:  Review of Systems  Constitutional:  Positive for fatigue. Negative for activity change, appetite change, chills and fever.  HENT:  Positive for congestion, postnasal drip, rhinorrhea and sinus pressure. Negative for ear discharge, ear pain, hearing loss, nosebleeds, sneezing and trouble swallowing.   Respiratory:  Negative for chest tightness and shortness of breath.   Cardiovascular:  Negative for chest pain and palpitations.  Skin:  Negative for rash.     Social History   Tobacco Use  Smoking Status Never  Smokeless Tobacco Never       Objective:     Wt Readings from Last 3 Encounters:  06/12/22 174 lb 12.8 oz (79.3 kg)  06/01/22 174 lb 4 oz (79 kg)   03/16/22 172 lb (78 kg)     Exam deferred. Pt. Harboring due to COVID 19. Phone visit performed.   Assessment & Plan:   1. Acute maxillary sinusitis, recurrence not specified     Meds ordered this encounter  Medications   amoxicillin-clavulanate (AUGMENTIN) 875-125 MG tablet    Sig: Take 1 tablet by mouth 2 (two) times daily. Take all of this medication    Dispense:  20 tablet    Refill:  0   pseudoephedrine-guaifenesin (MUCINEX D) 60-600 MG 12 hr tablet    Sig: Take 1 tablet by mouth every 12 (twelve) hours for 14 days. As needed for congestion    Dispense:  20 tablet    Refill:  0    No orders of the defined types were placed in this encounter.     Diagnoses and all orders for this visit:  Acute maxillary sinusitis, recurrence not specified  Other orders -     amoxicillin-clavulanate (AUGMENTIN) 875-125 MG tablet; Take 1 tablet by mouth 2 (two) times daily. Take all of this medication -     pseudoephedrine-guaifenesin (MUCINEX D) 60-600 MG 12 hr tablet; Take 1 tablet by mouth every 12 (twelve) hours for 14 days.  As needed for congestion    Virtual Visit via telephone Note  I discussed the limitations, risks, security and privacy concerns of performing an evaluation and management service by telephone and the availability of in person appointments. The patient was identified with two identifiers. Pt.expressed understanding and agreed to proceed. Pt. Is at home. Dr. Livia Snellen is in his office.  Follow Up Instructions:   I discussed the assessment and treatment plan with the patient. The patient was provided an opportunity to ask questions and all were answered. The patient agreed with the plan and demonstrated an understanding of the instructions.   The patient was advised to call back or seek an in-person evaluation if the symptoms worsen or if the condition fails to improve as anticipated.   Total minutes including chart review and phone contact time: 8   Follow up  plan: Return if symptoms worsen or fail to improve.  Claretta Fraise, MD [place]

## 2022-07-09 ENCOUNTER — Ambulatory Visit (INDEPENDENT_AMBULATORY_CARE_PROVIDER_SITE_OTHER): Payer: Medicare Other | Admitting: Family Medicine

## 2022-07-09 ENCOUNTER — Encounter: Payer: Self-pay | Admitting: Family Medicine

## 2022-07-09 VITALS — BP 139/73 | HR 81 | Temp 97.8°F | Ht 61.0 in | Wt 171.8 lb

## 2022-07-09 DIAGNOSIS — J40 Bronchitis, not specified as acute or chronic: Secondary | ICD-10-CM

## 2022-07-09 DIAGNOSIS — J329 Chronic sinusitis, unspecified: Secondary | ICD-10-CM | POA: Diagnosis not present

## 2022-07-09 MED ORDER — PREDNISONE 10 MG PO TABS: ORAL_TABLET | ORAL | 0 refills | Status: DC

## 2022-07-09 MED ORDER — LEVOFLOXACIN 500 MG PO TABS: 500.0000 mg | ORAL_TABLET | Freq: Every day | ORAL | 0 refills | Status: DC

## 2022-07-09 NOTE — Progress Notes (Addendum)
Subjective:  Patient ID: Vanessa Hubbard, female    DOB: 08-02-1946  Age: 76 y.o. MRN: [id number]  CC: Cough   HPI Vanessa Hubbard presents for recheck of productive cough. Got better with previous tx. Cough has rebounded, but producing thick sputum. Increased last 2 days. Denies fever, dyspnea. Has some PND. No otalgia or sore throat     07/09/2022   10:14 AM 06/12/2022    8:29 AM 06/01/2022   12:32 PM  Depression screen PHQ 2/9  Decreased Interest 0 0 0  Down, Depressed, Hopeless 0 0 0  PHQ - 2 Score 0 0 0  Altered sleeping  0 0  Tired, decreased energy  0 0  Change in appetite  0 0  Feeling bad or failure about yourself   0 0  Trouble concentrating  0 0  Moving slowly or fidgety/restless  0 0  Suicidal thoughts  0 0  PHQ-9 Score  0 0  Difficult doing work/chores  Not difficult at all Not difficult at all    History Vanessa Hubbard has a past medical history of Broken ankle (left), Diabetes mellitus without complication ([place]), Fibroids, History of fractured kneecap, Hyperlipidemia, Hypertension, Osteopenia, and Thyroid disease.   Vanessa Hubbard has a past surgical history that includes Ankle surgery; ORIF patella (01/22/2012); Knee surgery (Right); Cholecystectomy; Tonsillectomy; Breast lumpectomy (Right); Abdominal hysterectomy; and Breast biopsy (Right, 1987).   Her family history includes Alcohol abuse in her son; Allergies in her daughter; Breast cancer in her mother; COPD in her father; Cancer in her mother; Congenital heart disease in her daughter; Diabetes in her father; Heart disease in her father; Heart failure in her father; Jaundice in her mother; Thyroid disease in her daughter.Vanessa Hubbard reports that Vanessa Hubbard has never smoked. Vanessa Hubbard has never used smokeless tobacco. Vanessa Hubbard reports that Vanessa Hubbard does not drink alcohol and does not use drugs.    ROS Review of Systems  Constitutional:  Negative for chills, diaphoresis and fever.  HENT:  Positive for congestion, postnasal drip and rhinorrhea. Negative for  ear pain and sore throat.   Respiratory:  Positive for cough. Negative for shortness of breath.   Cardiovascular:  Negative for chest pain.  Musculoskeletal:  Negative for arthralgias.  Skin:  Negative for rash.    Objective:  BP 139/73   Pulse 81   Temp 97.8 F (36.6 C)   Ht '5\' 1"'$  (1.549 m)   Wt 171 lb 12.8 oz (77.9 kg)   SpO2 97%   BMI 32.46 kg/m   BP Readings from Last 3 Encounters:  07/09/22 139/73  06/12/22 131/65  06/01/22 134/71    Wt Readings from Last 3 Encounters:  07/09/22 171 lb 12.8 oz (77.9 kg)  06/12/22 174 lb 12.8 oz (79.3 kg)  06/01/22 174 lb 4 oz (79 kg)     Physical Exam Constitutional:      Appearance: Vanessa Hubbard is well-developed.  HENT:     Head: Normocephalic and atraumatic.     Right Ear: Tympanic membrane and external ear normal. No decreased hearing noted.     Left Ear: Tympanic membrane and external ear normal. No decreased hearing noted.     Nose: Mucosal edema present.     Right Sinus: No frontal sinus tenderness.     Left Sinus: No frontal sinus tenderness.     Mouth/Throat:     Pharynx: No oropharyngeal exudate or posterior oropharyngeal erythema.  Neck:     Meningeal: Brudzinski's sign absent.  Pulmonary:     Effort: No  respiratory distress.     Breath sounds: Normal breath sounds.  Lymphadenopathy:     Head:     Right side of head: No preauricular adenopathy.     Left side of head: No preauricular adenopathy.     Cervical:     Right cervical: No superficial cervical adenopathy.    Left cervical: No superficial cervical adenopathy.       Assessment & Plan:   Vanessa Hubbard was seen today for cough.  Diagnoses and all orders for this visit:  Sinobronchitis  Other orders -     levofloxacin (LEVAQUIN) 500 MG tablet; Take 1 tablet (500 mg total) by mouth daily. For 10 days -     predniSONE (DELTASONE) 10 MG tablet; Take 5 daily for 2 days followed by 4,3,2 and 1 for 2 days each.       I am having Vanessa Hubbard start on  levofloxacin and predniSONE. I am also having her maintain her Vitamin D3, multivitamin, aspirin, cetirizine, fluticasone, B-D ULTRA-FINE 33 LANCETS, glucose blood, diclofenac Sodium, rosuvastatin, hydrochlorothiazide, levothyroxine, and amoxicillin-clavulanate.  Allergies as of 07/09/2022   No Known Allergies      Medication List        Accurate as of July 09, 2022 11:59 PM. If you have any questions, ask your nurse or doctor.          amoxicillin-clavulanate 875-125 MG tablet Commonly known as: AUGMENTIN Take 1 tablet by mouth 2 (two) times daily. Take all of this medication   aspirin 81 MG tablet Take 81 mg by mouth 2 (two) times a week.   B-D ULTRA-FINE 33 LANCETS Misc Use to check BG daily (Dx 11.9 type 2 DM)   cetirizine 10 MG tablet Commonly known as: ZYRTEC Take 1 tablet (10 mg total) by mouth daily as needed. Allergies.   diclofenac Sodium 1 % Gel Commonly known as: Voltaren Apply 2 g topically 4 (four) times daily.   fluticasone 50 MCG/ACT nasal spray Commonly known as: FLONASE Place 2 sprays into both nostrils daily.   glucose blood test strip Commonly known as: Visual merchandiser Next Test Use to check BG once a day. Dx 11.9 - type 2 diabetes   hydrochlorothiazide 25 MG tablet Commonly known as: HYDRODIURIL Take 1 tablet (25 mg total) by mouth daily.   levofloxacin 500 MG tablet Commonly known as: LEVAQUIN Take 1 tablet (500 mg total) by mouth daily. For 10 days Started by: Claretta Fraise, MD   levothyroxine 100 MCG tablet Commonly known as: SYNTHROID Take 1 tablet (100 mcg total) by mouth daily. Except 1/2 on Sunday   multivitamin tablet Take 1 tablet by mouth daily.   predniSONE 10 MG tablet Commonly known as: DELTASONE Take 5 daily for 2 days followed by 4,3,2 and 1 for 2 days each. Started by: Claretta Fraise, MD   rosuvastatin 10 MG tablet Commonly known as: CRESTOR TAKE 1/2 TABLET BY MOUTH DAILY   Vitamin D3 50 MCG (2000 UT) Tabs Take 1  tablet by mouth.         Follow-up: Return if symptoms worsen or fail to improve.  Claretta Fraise, M.D.

## 2022-07-15 ENCOUNTER — Ambulatory Visit
Admission: RE | Admit: 2022-07-15 | Discharge: 2022-07-15 | Disposition: A | Discharge status: Home / Self Care | Payer: Medicare Other | Source: Ambulatory Visit | Attending: Family Medicine | Admitting: Family Medicine

## 2022-07-15 DIAGNOSIS — Z1231 Encounter for screening mammogram for malignant neoplasm of breast: Secondary | ICD-10-CM | POA: Diagnosis not present

## 2022-08-04 ENCOUNTER — Encounter (INDEPENDENT_AMBULATORY_CARE_PROVIDER_SITE_OTHER): Payer: Medicare Other | Admitting: Family Medicine

## 2022-08-04 DIAGNOSIS — B3731 Acute candidiasis of vulva and vagina: Secondary | ICD-10-CM

## 2022-08-05 MED ORDER — FLUCONAZOLE 150 MG PO TABS: 150.0000 mg | ORAL_TABLET | Freq: Once | ORAL | 0 refills | Status: AC

## 2022-08-05 NOTE — Telephone Encounter (Signed)
Please see the MyChart message reply(ies) for my assessment and plan.   Meds ordered this encounter  Medications   fluconazole (DIFLUCAN) 150 MG tablet    Sig: Take 1 tablet (150 mg total) by mouth once for 1 dose. Repeat in 3 days if no resolution of symptoms    Dispense:  2 tablet    Refill:  0      This patient gave consent for this Medical Advice Message and is aware that it may result in a bill to [employer], as well as the possibility of receiving a bill for a co-payment or deductible. They are an established patient, but are not seeking medical advice exclusively about a problem treated during an in person or video visit in the last seven days. I did not recommend an in person or video visit within seven days of my reply.    I spent a total of 5 minutes cumulative time within 7 days through [employer].  Ronnie Doss, DO

## 2022-08-12 NOTE — Telephone Encounter (Signed)
I have scheduled the patient for 08/20/2022 eye event, 1:00p. Thank you

## 2022-08-14 ENCOUNTER — Other Ambulatory Visit: Payer: Self-pay | Admitting: Family Medicine

## 2022-08-14 DIAGNOSIS — I152 Hypertension secondary to endocrine disorders: Secondary | ICD-10-CM

## 2022-08-20 LAB — HM DIABETES EYE EXAM

## 2022-08-25 ENCOUNTER — Encounter (INDEPENDENT_AMBULATORY_CARE_PROVIDER_SITE_OTHER): Payer: Medicare Other | Admitting: Family Medicine

## 2022-08-25 DIAGNOSIS — B372 Candidiasis of skin and nail: Secondary | ICD-10-CM

## 2022-08-25 MED ORDER — NYSTATIN 100000 UNIT/GM EX CREA: 1.0000 | TOPICAL_CREAM | Freq: Two times a day (BID) | CUTANEOUS | 0 refills | Status: DC

## 2022-08-25 NOTE — Telephone Encounter (Signed)
Please see the MyChart message reply(ies) for my assessment and plan.    Meds ordered this encounter  Medications   nystatin cream (MYCOSTATIN)    Sig: Apply 1 Application topically 2 (two) times daily.    Dispense:  30 g    Refill:  0      This patient gave consent for this Medical Advice Message and is aware that it may result in a bill to [employer], as well as the possibility of receiving a bill for a co-payment or deductible. They are an established patient, but are not seeking medical advice exclusively about a problem treated during an in person or video visit in the last seven days. I did not recommend an in person or video visit within seven days of my reply.    I spent a total of 5 minutes cumulative time within 7 days through [employer].  Ronnie Doss, DO

## 2022-09-29 ENCOUNTER — Ambulatory Visit (INDEPENDENT_AMBULATORY_CARE_PROVIDER_SITE_OTHER): Payer: Medicare Other

## 2022-09-29 DIAGNOSIS — Z23 Encounter for immunization: Secondary | ICD-10-CM

## 2022-10-09 ENCOUNTER — Ambulatory Visit (INDEPENDENT_AMBULATORY_CARE_PROVIDER_SITE_OTHER): Payer: Medicare Other | Admitting: Family Medicine

## 2022-10-09 ENCOUNTER — Encounter: Payer: Self-pay | Admitting: Family Medicine

## 2022-10-09 VITALS — BP 142/68 | HR 79 | Temp 98.0°F | Ht 61.0 in | Wt 173.8 lb

## 2022-10-09 DIAGNOSIS — I152 Hypertension secondary to endocrine disorders: Secondary | ICD-10-CM

## 2022-10-09 DIAGNOSIS — E034 Atrophy of thyroid (acquired): Secondary | ICD-10-CM

## 2022-10-09 DIAGNOSIS — R7989 Other specified abnormal findings of blood chemistry: Secondary | ICD-10-CM | POA: Diagnosis not present

## 2022-10-09 DIAGNOSIS — E1159 Type 2 diabetes mellitus with other circulatory complications: Secondary | ICD-10-CM

## 2022-10-09 DIAGNOSIS — E1169 Type 2 diabetes mellitus with other specified complication: Secondary | ICD-10-CM

## 2022-10-09 DIAGNOSIS — E785 Hyperlipidemia, unspecified: Secondary | ICD-10-CM

## 2022-10-09 LAB — BAYER DCA HB A1C WAIVED: HB A1C (BAYER DCA - WAIVED): 6.6 % — ABNORMAL HIGH (ref 4.8–5.6)

## 2022-10-09 MED ORDER — ROSUVASTATIN CALCIUM 10 MG PO TABS: 5.0000 mg | ORAL_TABLET | Freq: Every day | ORAL | 3 refills | Status: DC

## 2022-10-09 MED ORDER — HYDROCHLOROTHIAZIDE 25 MG PO TABS: 25.0000 mg | ORAL_TABLET | Freq: Every day | ORAL | 3 refills | Status: DC

## 2022-10-09 NOTE — Patient Instructions (Signed)

## 2022-10-09 NOTE — Progress Notes (Signed)
Subjective: CC: Follow-up hypertension, hyperlipidemia PCP: [name], DO ZOX:WRUEAV B Vanessa Hubbard is a 76 y.o. female presenting to clinic today for:  1.  Hypertension associate with hyperlipidemia, type 2 diabetes Patient has had diet-controlled diabetes.  She is compliant with hydrochlorothiazide, Crestor 5 mg daily.  She is fasting would like to have labs done today.  No chest pain, shortness of breath, visual disturbance.  She does not check blood sugars and denies any symptomatic high or low sugars  2.  Hypothyroidism Compliant with Synthroid 50 mcg on Sundays and 100 mcg daily all others.  No reports of tremor, difficulty swallowing, changes in bowel habits     ROS: Per HPI  No Known Allergies Past Medical History:  Diagnosis Date   Broken ankle left   Diabetes mellitus without complication (HCC)    Fibroids    History of fractured kneecap    Hyperlipidemia    Hypertension    Osteopenia    Thyroid disease     Current Outpatient Medications:    aspirin 81 MG tablet, Take 81 mg by mouth 2 (two) times a week. , Disp: , Rfl:    B-D ULTRA-FINE 33 LANCETS MISC, Use to check BG daily (Dx 11.9 type 2 DM), Disp: 100 each, Rfl: 1   cetirizine (ZYRTEC) 10 MG tablet, Take 1 tablet (10 mg total) by mouth daily as needed. Allergies., Disp: 90 tablet, Rfl: 1   Cholecalciferol (VITAMIN D3) 2000 UNITS TABS, Take 1 tablet by mouth., Disp: , Rfl:    fluticasone (FLONASE) 50 MCG/ACT nasal spray, Place 2 sprays into both nostrils daily., Disp: 16 g, Rfl: 6   glucose blood (BAYER CONTOUR NEXT TEST) test strip, Use to check BG once a day. Dx 11.9 - type 2 diabetes, Disp: 50 each, Rfl: 2   levothyroxine (SYNTHROID) 100 MCG tablet, Take 1 tablet (100 mcg total) by mouth daily. Except 1/2 on Sunday, Disp: 84 tablet, Rfl: 3   Multiple Vitamin (MULTIVITAMIN) tablet, Take 1 tablet by mouth daily., Disp: , Rfl:    nystatin cream (MYCOSTATIN), Apply 1 Application topically 2 (two) times  daily., Disp: 30 g, Rfl: 0   hydrochlorothiazide (HYDRODIURIL) 25 MG tablet, Take 1 tablet (25 mg total) by mouth daily., Disp: 90 tablet, Rfl: 3   rosuvastatin (CRESTOR) 10 MG tablet, Take 0.5 tablets (5 mg total) by mouth daily., Disp: 45 tablet, Rfl: 3 Social History   Socioeconomic History   Marital status: Married    Spouse name: Jeneen Rinks "Clair Gulling" Colocho    Number of children: 3   Years of education: GED   Highest education level: GED or equivalent  Occupational History   Occupation: Retired    Fish farm manager: [employer]    Comment: Research and development  Tobacco Use   Smoking status: Never   Smokeless tobacco: Never  Vaping Use   Vaping Use: Never used  Substance and Sexual Activity   Alcohol use: No   Drug use: No   Sexual activity: Not Currently  Other Topics Concern   Not on file  Social History Narrative   Lives with her husband in one level home   Social Determinants of Health   Financial Resource Strain: [place]  (03/16/2022)   Overall Financial Resource Strain (CARDIA)    Difficulty of Paying Living Expenses: Not hard at all  Food Insecurity: No Food Insecurity (03/16/2022)   Hunger Vital Sign    Worried About Running Out of Food in the Last Year: Never true  Ran Out of Food in the Last Year: Never true  Transportation Needs: No Transportation Needs (03/16/2022)   PRAPARE - Hydrologist (Medical): No    Lack of Transportation (Non-Medical): No  Physical Activity: Sufficiently Active (03/16/2022)   Exercise Vital Sign    Days of Exercise per Week: 5 days    Minutes of Exercise per Session: 30 min  Stress: No Stress Concern Present (03/16/2022)   [place]    Feeling of Stress : Not at all  Social Connections: Socially Integrated (03/16/2022)   Social Connection and Isolation Panel [NHANES]    Frequency of Communication with Friends and Family: More than three times a week     Frequency of Social Gatherings with Friends and Family: More than three times a week    Attends Religious Services: More than 4 times per year    Active Member of [employer] or Organizations: Yes    Attends Music therapist: More than 4 times per year    Marital Status: Married  Human resources officer Violence: Not At Risk (03/16/2022)   Humiliation, Afraid, Rape, and Kick questionnaire    Fear of Current or Ex-Partner: No    Emotionally Abused: No    Physically Abused: No    Sexually Abused: No   Family History  Problem Relation Age of Onset   Jaundice Mother    Cancer Mother        breast   Breast cancer Mother    COPD Father    Heart disease Father        triple bypass   Heart failure Father    Diabetes Father    Thyroid disease Daughter    Allergies Daughter    Alcohol abuse Son    Congenital heart disease Daughter     Objective: Office vital signs reviewed. BP (!) 142/68   Pulse 79   Temp 98 F (36.7 C)   Ht _0  (1.549 m)   Wt 173 lb 12.8 oz (78.8 kg)   SpO2 99%   BMI 32.84 kg/m   Physical Examination:  General: Awake, alert, well nourished, No acute distress HEENT: Sclera white.  No exophthalmos.  No goiter Cardio: regular rate and rhythm, S1S2 heard, no murmurs appreciated Pulm: clear to auscultation bilaterally, no wheezes, rhonchi or rales; normal work of breathing on room air Neuro: No tremor  Assessment/ Plan: 76 y.o. female   Controlled type 2 diabetes mellitus with other specified complication, without long-term current use of insulin ([place]) - Plan: Bayer DCA Hb A1c Waived  Hyperlipidemia associated with type 2 diabetes mellitus ([place]) - Plan: CMP14+EGFR, Lipid Panel, rosuvastatin (CRESTOR) 10 MG tablet  Hypertension associated with diabetes ([place]) - Plan: CMP14+EGFR, hydrochlorothiazide (HYDRODIURIL) 25 MG tablet  Hypothyroidism due to acquired atrophy of thyroid - Plan: TSH, T4, free  Sugar remains at goal.  No changes.  Urine microalbumin  ordered  Fasting lipid collected.  Check CMP.  Crestor renewed  Continue hydrochlorothiazide.  Asymptomatic from a thyroid standpoint.  Check thyroid levels  Orders Placed This Encounter  Procedures   TSH   Bayer DCA Hb A1c Waived   T4, free   CMP14+EGFR   Lipid Panel   Meds ordered this encounter  Medications   hydrochlorothiazide (HYDRODIURIL) 25 MG tablet    Sig: Take 1 tablet (25 mg total) by mouth daily.    Dispense:  90 tablet    Refill:  3  rosuvastatin (CRESTOR) 10 MG tablet    Sig: Take 0.5 tablets (5 mg total) by mouth daily.    Dispense:  45 tablet    Refill:  [place], [place] [phone]

## 2022-10-10 LAB — LIPID PANEL
Chol/HDL Ratio: 1.8 ratio (ref 0.0–4.4)
Cholesterol, Total: 125 mg/dL (ref 100–199)
HDL: 68 mg/dL (ref >39)
LDL Chol Calc (NIH): 38 mg/dL (ref 0–99)
Triglycerides: 107 mg/dL (ref 0–149)
VLDL Cholesterol Cal: 19 mg/dL (ref 5–40)

## 2022-10-10 LAB — CMP14+EGFR
ALT: 14 IU/L (ref 0–32)
AST: 18 IU/L (ref 0–40)
Albumin/Globulin Ratio: 2.5 — ABNORMAL HIGH (ref 1.2–2.2)
Albumin: 4.5 g/dL (ref 3.8–4.8)
Alkaline Phosphatase: 76 IU/L (ref 44–121)
BUN/Creatinine Ratio: 14 (ref 12–28)
BUN: 11 mg/dL (ref 8–27)
Bilirubin Total: 1.4 mg/dL — ABNORMAL HIGH (ref 0.0–1.2)
CO2: 26 mmol/L (ref 20–29)
Calcium: 9.3 mg/dL (ref 8.7–10.3)
Chloride: 100 mmol/L (ref 96–106)
Creatinine, Ser: 0.76 mg/dL (ref 0.57–1.00)
Globulin, Total: 1.8 g/dL (ref 1.5–4.5)
Glucose: 129 mg/dL — ABNORMAL HIGH (ref 70–99)
Potassium: 4.1 mmol/L (ref 3.5–5.2)
Sodium: 142 mmol/L (ref 134–144)
Total Protein: 6.3 g/dL (ref 6.0–8.5)
eGFR: 80 mL/min/{1.73_m2} (ref >59)

## 2022-10-10 LAB — TSH: TSH: 9.29 u[IU]/mL — ABNORMAL HIGH (ref 0.450–4.500)

## 2022-10-10 LAB — T4, FREE: Free T4: 1.31 ng/dL (ref 0.82–1.77)

## 2022-10-12 NOTE — Addendum Note (Signed)
Addended by: Everlean Cherry on: 10/12/2022 01:45 PM   Modules accepted: Orders

## 2022-10-20 ENCOUNTER — Other Ambulatory Visit: Payer: Self-pay | Admitting: Family Medicine

## 2022-10-20 DIAGNOSIS — E1169 Type 2 diabetes mellitus with other specified complication: Secondary | ICD-10-CM

## 2022-12-02 ENCOUNTER — Other Ambulatory Visit: Payer: Medicare Other

## 2022-12-02 DIAGNOSIS — R7989 Other specified abnormal findings of blood chemistry: Secondary | ICD-10-CM

## 2022-12-02 DIAGNOSIS — E034 Atrophy of thyroid (acquired): Secondary | ICD-10-CM

## 2022-12-03 LAB — TSH: TSH: 4.32 u[IU]/mL (ref 0.450–4.500)

## 2022-12-03 LAB — T4, FREE: Free T4: 1.47 ng/dL (ref 0.82–1.77)

## 2022-12-17 ENCOUNTER — Encounter: Payer: Self-pay | Admitting: Family Medicine

## 2023-01-29 DIAGNOSIS — E119 Type 2 diabetes mellitus without complications: Secondary | ICD-10-CM | POA: Diagnosis not present

## 2023-03-18 ENCOUNTER — Ambulatory Visit (INDEPENDENT_AMBULATORY_CARE_PROVIDER_SITE_OTHER): Payer: Medicare Other

## 2023-03-18 VITALS — Ht 63.0 in | Wt 172.0 lb

## 2023-03-18 DIAGNOSIS — Z0000 Encounter for general adult medical examination without abnormal findings: Secondary | ICD-10-CM

## 2023-03-18 NOTE — Patient Instructions (Signed)
Vanessa Hubbard , Thank you for taking time to come for your Medicare Wellness Visit. I appreciate your ongoing commitment to your health goals. Please review the following plan we discussed and let me know if I can assist you in the future.   These are the goals we discussed:  Goals      DIET - INCREASE LEAN PROTEINS     03/16/2022 AWV Goal: Improved Nutrition/Diet  Patient will verbalize understanding that diet plays an important role in overall health and that a poor diet is a risk factor for many chronic medical conditions.  Over the next year, patient will improve self management of their diet by incorporating improved meal pattern, increased physical activity, improved protein intake, chew all foods well, and eat 3 meals daily. Patient will utilize available community resources to help with food acquisition if needed (ex: food pantries, [address], etc) Patient will work with nutrition specialist if a referral was made      DIET - INCREASE WATER INTAKE     Exercise 150 min/wk Moderate Activity     03/16/2022 AWV Goal: Exercise for General Health  Patient will verbalize understanding of the benefits of increased physical activity: Exercising regularly is important. It will improve your overall fitness, flexibility, and endurance. Regular exercise also will improve your overall health. It can help you control your weight, reduce stress, and improve your bone density. Over the next year, patient will increase physical activity as tolerated with a goal of at least 150 minutes of moderate physical activity per week.  You can tell that you are exercising at a moderate intensity if your heart starts beating faster and you start breathing faster but can still hold a conversation. Moderate-intensity exercise ideas include: Walking 1 mile (1.6 km) in about 15 minutes Biking Hiking Golfing Dancing Water aerobics Patient will verbalize understanding of everyday activities that increase physical  activity by providing examples like the following: Yard work, such as: Insurance underwriter Gardening Washing windows or floors Patient will be able to explain general safety guidelines for exercising:  Before you start a new exercise program, talk with your health care provider. Do not exercise so much that you hurt yourself, feel dizzy, or get very short of breath. Wear comfortable clothes and wear shoes with good support. Drink plenty of water while you exercise to prevent dehydration or heat stroke. Work out until your breathing and your heartbeat get faster.         This is a list of the screening recommended for you and due dates:  Health Maintenance  Topic Date Due   Zoster (Shingles) Vaccine (1 of 2) Never done   DTaP/Tdap/Td vaccine (2 - Td or Tdap) 07/06/2021   Yearly kidney health urinalysis for diabetes  07/04/2022   COVID-19 Vaccine (5 - 2023-24 season) 04/03/2023*   Hemoglobin A1C  04/10/2023   Flu Shot  06/10/2023   Complete foot exam   06/13/2023   Mammogram  07/16/2023   Eye exam for diabetics  08/21/2023   Yearly kidney function blood test for diabetes  10/10/2023   DEXA scan (bone density measurement)  02/19/2024   Medicare Annual Wellness Visit  03/17/2024   Pneumonia Vaccine  Completed   Hepatitis C Screening: USPSTF Recommendation to screen - Ages 18-79 yo.  Completed   HPV Vaccine  Aged Out   Cologuard (Stool DNA test)  Discontinued  *Topic was postponed. The date  shown is not the original due date.    Advanced directives: Advance directive discussed with you today. I have provided a copy for you to complete at home and have notarized. Once this is complete please bring a copy in to our office so we can scan it into your chart.   Conditions/risks identified: Aim for 30 minutes of exercise or brisk walking, 6-8 glasses of water, and 5 servings of fruits and vegetables each  day.   Next appointment: Follow up in one year for your annual wellness visit    Preventive Care 65 Years and Older, Female Preventive care refers to lifestyle choices and visits with your health care provider that can promote health and wellness. What does preventive care include? A yearly physical exam. This is also called an annual well check. Dental exams once or twice a year. Routine eye exams. Ask your health care provider how often you should have your eyes checked. Personal lifestyle choices, including: Daily care of your teeth and gums. Regular physical activity. Eating a healthy diet. Avoiding tobacco and drug use. Limiting alcohol use. Practicing safe sex. Taking low-dose aspirin every day. Taking vitamin and mineral supplements as recommended by your health care provider. What happens during an annual well check? The services and screenings done by your health care provider during your annual well check will depend on your age, overall health, lifestyle risk factors, and family history of disease. Counseling  Your health care provider may ask you questions about your: Alcohol use. Tobacco use. Drug use. Emotional well-being. Home and relationship well-being. Sexual activity. Eating habits. History of falls. Memory and ability to understand (cognition). Work and work Astronomer. Reproductive health. Screening  You may have the following tests or measurements: Height, weight, and BMI. Blood pressure. Lipid and cholesterol levels. These may be checked every 5 years, or more frequently if you are over 26 years old. Skin check. Lung cancer screening. You may have this screening every year starting at age 15 if you have a 30-pack-year history of smoking and currently smoke or have quit within the past 15 years. Fecal occult blood test (FOBT) of the stool. You may have this test every year starting at age 74. Flexible sigmoidoscopy or colonoscopy. You may have a  sigmoidoscopy every 5 years or a colonoscopy every 10 years starting at age 33. Hepatitis C blood test. Hepatitis B blood test. Sexually transmitted disease (STD) testing. Diabetes screening. This is done by checking your blood sugar (glucose) after you have not eaten for a while (fasting). You may have this done every 1-3 years. Bone density scan. This is done to screen for osteoporosis. You may have this done starting at age 63. Mammogram. This may be done every 1-2 years. Talk to your health care provider about how often you should have regular mammograms. Talk with your health care provider about your test results, treatment options, and if necessary, the need for more tests. Vaccines  Your health care provider may recommend certain vaccines, such as: Influenza vaccine. This is recommended every year. Tetanus, diphtheria, and acellular pertussis (Tdap, Td) vaccine. You may need a Td booster every 10 years. Zoster vaccine. You may need this after age 15. Pneumococcal 13-valent conjugate (PCV13) vaccine. One dose is recommended after age 66. Pneumococcal polysaccharide (PPSV23) vaccine. One dose is recommended after age 40. Talk to your health care provider about which screenings and vaccines you need and how often you need them. This information is not intended to replace advice given  to you by your health care provider. Make sure you discuss any questions you have with your health care provider. Document Released: 11/22/2015 Document Revised: 07/15/2016 Document Reviewed: 08/27/2015 Elsevier Interactive Patient Education  2017 [employer].  Fall Prevention in the Home Falls can cause injuries. They can happen to people of all ages. There are many things you can do to make your home safe and to help prevent falls. What can I do on the outside of my home? Regularly fix the edges of walkways and driveways and fix any cracks. Remove anything that might make you trip as you walk through a  door, such as a raised step or threshold. Trim any bushes or trees on the path to your home. Use bright outdoor lighting. Clear any walking paths of anything that might make someone trip, such as rocks or tools. Regularly check to see if handrails are loose or broken. Make sure that both sides of any steps have handrails. Any raised decks and porches should have guardrails on the edges. Have any leaves, snow, or ice cleared regularly. Use sand or salt on walking paths during winter. Clean up any spills in your garage right away. This includes oil or grease spills. What can I do in the bathroom? Use night lights. Install grab bars by the toilet and in the tub and shower. Do not use towel bars as grab bars. Use non-skid mats or decals in the tub or shower. If you need to sit down in the shower, use a plastic, non-slip stool. Keep the floor dry. Clean up any water that spills on the floor as soon as it happens. Remove soap buildup in the tub or shower regularly. Attach bath mats securely with double-sided non-slip rug tape. Do not have throw rugs and other things on the floor that can make you trip. What can I do in the bedroom? Use night lights. Make sure that you have a light by your bed that is easy to reach. Do not use any sheets or blankets that are too big for your bed. They should not hang down onto the floor. Have a firm chair that has side arms. You can use this for support while you get dressed. Do not have throw rugs and other things on the floor that can make you trip. What can I do in the kitchen? Clean up any spills right away. Avoid walking on wet floors. Keep items that you use a lot in easy-to-reach places. If you need to reach something above you, use a strong step stool that has a grab bar. Keep electrical cords out of the way. Do not use floor polish or wax that makes floors slippery. If you must use wax, use non-skid floor wax. Do not have throw rugs and other things  on the floor that can make you trip. What can I do with my stairs? Do not leave any items on the stairs. Make sure that there are handrails on both sides of the stairs and use them. Fix handrails that are broken or loose. Make sure that handrails are as long as the stairways. Check any carpeting to make sure that it is firmly attached to the stairs. Fix any carpet that is loose or worn. Avoid having throw rugs at the top or bottom of the stairs. If you do have throw rugs, attach them to the floor with carpet tape. Make sure that you have a light switch at the top of the stairs and the bottom of the  stairs. If you do not have them, ask someone to add them for you. What else can I do to help prevent falls? Wear shoes that: Do not have high heels. Have rubber bottoms. Are comfortable and fit you well. Are closed at the toe. Do not wear sandals. If you use a stepladder: Make sure that it is fully opened. Do not climb a closed stepladder. Make sure that both sides of the stepladder are locked into place. Ask someone to hold it for you, if possible. Clearly mark and make sure that you can see: Any grab bars or handrails. First and last steps. Where the edge of each step is. Use tools that help you move around (mobility aids) if they are needed. These include: Canes. Walkers. Scooters. Crutches. Turn on the lights when you go into a dark area. Replace any light bulbs as soon as they burn out. Set up your furniture so you have a clear path. Avoid moving your furniture around. If any of your floors are uneven, fix them. If there are any pets around you, be aware of where they are. Review your medicines with your doctor. Some medicines can make you feel dizzy. This can increase your chance of falling. Ask your doctor what other things that you can do to help prevent falls. This information is not intended to replace advice given to you by your health care provider. Make sure you discuss any  questions you have with your health care provider. Document Released: 08/22/2009 Document Revised: 04/02/2016 Document Reviewed: 11/30/2014 Elsevier Interactive Patient Education  2017 [employer].

## 2023-03-18 NOTE — Progress Notes (Signed)
Subjective:   Vanessa Hubbard is a 77 y.o. female who presents for Medicare Annual (Subsequent) preventive examination. I connected with  Shirlean Kelly on 03/18/23 by a audio enabled telemedicine application and verified that I am speaking with the correct person using two identifiers.  Patient Location: Home  Provider Location: Home Office  I discussed the limitations of evaluation and management by telemedicine. The patient expressed understanding and agreed to proceed.  Review of Systems     Cardiac Risk Factors include: advanced age (>41men, >88 women);diabetes mellitus;dyslipidemia;hypertension     Objective:    Today's Vitals   03/18/23 1000  Weight: 172 lb (78 kg)  Height: 5\' 3"  (1.6 m)   Body mass index is 30.47 kg/m.     03/18/2023   10:04 AM 03/16/2022    9:48 AM 03/13/2021    9:51 AM 03/11/2020    9:33 AM 03/09/2019    9:33 AM 01/31/2018    8:59 AM 01/27/2017   12:01 PM  Advanced Directives  Does Patient Have a Medical Advance Directive? No Yes No No No No No  Type of Furniture conservator/restorer;Living will       Copy of Healthcare Power of Attorney in Chart?  No - copy requested       Would patient like information on creating a medical advance directive? No - Patient declined  No - Patient declined Yes (MAU/Ambulatory/Procedural Areas - Information given) No - Patient declined No - Patient declined Yes (MAU/Ambulatory/Procedural Areas - Information given)    Current Medications (verified) Outpatient Encounter Medications as of 03/18/2023  Medication Sig   aspirin 81 MG tablet Take 81 mg by mouth 2 (two) times a week.    B-D ULTRA-FINE 33 LANCETS MISC Use to check BG daily (Dx 11.9 type 2 DM)   cetirizine (ZYRTEC) 10 MG tablet Take 1 tablet (10 mg total) by mouth daily as needed. Allergies.   Cholecalciferol (VITAMIN D3) 2000 UNITS TABS Take 1 tablet by mouth.   fluticasone (FLONASE) 50 MCG/ACT nasal spray Place 2 sprays into both nostrils  daily.   glucose blood (BAYER CONTOUR NEXT TEST) test strip Use to check BG once a day. Dx 11.9 - type 2 diabetes   hydrochlorothiazide (HYDRODIURIL) 25 MG tablet Take 1 tablet (25 mg total) by mouth daily.   levothyroxine (SYNTHROID) 100 MCG tablet Take 1 tablet (100 mcg total) by mouth daily. Except 1/2 on Sunday   Multiple Vitamin (MULTIVITAMIN) tablet Take 1 tablet by mouth daily.   nystatin cream (MYCOSTATIN) Apply 1 Application topically 2 (two) times daily.   rosuvastatin (CRESTOR) 10 MG tablet Take 0.5 tablets (5 mg total) by mouth daily.   No facility-administered encounter medications on file as of 03/18/2023.    Allergies (verified) Patient has no known allergies.   History: Past Medical History:  Diagnosis Date   Broken ankle left   Diabetes mellitus without complication (HCC)    Fibroids    History of fractured kneecap    Hyperlipidemia    Hypertension    Osteopenia    Thyroid disease    Past Surgical History:  Procedure Laterality Date   ABDOMINAL HYSTERECTOMY     complete    ANKLE SURGERY     BREAST BIOPSY Right 1987   benign   BREAST LUMPECTOMY Right    benign   CHOLECYSTECTOMY     KNEE SURGERY Right    ORIF PATELLA  01/22/2012   Procedure: OPEN REDUCTION INTERNAL (ORIF)  FIXATION PATELLA;  Surgeon: Cammy Copa, MD;  Location: [facility];  Service: Orthopedics;  Laterality: Left;   TONSILLECTOMY     Family History  Problem Relation Age of Onset   Jaundice Mother    Cancer Mother        breast   Breast cancer Mother    COPD Father    Heart disease Father        triple bypass   Heart failure Father    Diabetes Father    Thyroid disease Daughter    Allergies Daughter    Alcohol abuse Son    Congenital heart disease Daughter    Social History   Socioeconomic History   Marital status: Married    Spouse name: Fayrene Fearing "Rosanne Ashing" Caster    Number of children: 3   Years of education: GED   Highest education level: GED or equivalent  Occupational  History   Occupation: Retired    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  Tobacco Use   Smoking status: Never   Smokeless tobacco: Never  Vaping Use   Vaping Use: Never used  Substance and Sexual Activity   Alcohol use: No   Drug use: No   Sexual activity: Not Currently  Other Topics Concern   Not on file  Social History Narrative   Lives with her husband in one level home   Social Determinants of Health   Financial Resource Strain: Low Risk  (03/18/2023)   Overall Financial Resource Strain (CARDIA)    Difficulty of Paying Living Expenses: Not hard at all  Food Insecurity: No Food Insecurity (03/18/2023)   Hunger Vital Sign    Worried About Running Out of Food in the Last Year: Never true    Ran Out of Food in the Last Year: Never true  Transportation Needs: No Transportation Needs (03/18/2023)   PRAPARE - Administrator, Civil Service (Medical): No    Lack of Transportation (Non-Medical): No  Physical Activity: Insufficiently Active (03/18/2023)   Exercise Vital Sign    Days of Exercise per Week: 2 days    Minutes of Exercise per Session: 20 min  Stress: No Stress Concern Present (03/18/2023)   [employer] of Occupational Health - Occupational Stress Questionnaire    Feeling of Stress : Not at all  Social Connections: Socially Integrated (03/18/2023)   Social Connection and Isolation Panel [NHANES]    Frequency of Communication with Friends and Family: More than three times a week    Frequency of Social Gatherings with Friends and Family: More than three times a week    Attends Religious Services: More than 4 times per year    Active Member of [employer] or Organizations: Yes    Attends Engineer, structural: More than 4 times per year    Marital Status: Married    Tobacco Counseling Counseling given: Not Answered   Clinical Intake:  Pre-visit preparation completed: Yes  Pain : No/denies pain     Nutritional Risks: None Diabetes:  Yes CBG done?: No Did pt. bring in CBG monitor from home?: No  How often do you need to have someone help you when you read instructions, pamphlets, or other written materials from your doctor or pharmacy?: 1 - Never  Diabetic?yes  Nutrition Risk Assessment:  Has the patient had any N/V/D within the last 2 months?  No  Does the patient have any non-healing wounds?  No  Has the patient had any unintentional weight loss  or weight gain?  No   Diabetes:  Is the patient diabetic?  Yes  If diabetic, was a CBG obtained today?  No  Did the patient bring in their glucometer from home?  No  How often do you monitor your CBG's? Daily .  Yes  Financial Strains and Diabetes Management:  Are you having any financial strains with the device, your supplies or your medication? No .  Does the patient want to be seen by Chronic Care Management for management of their diabetes?  No  Would the patient like to be referred to a Nutritionist or for Diabetic Management?  No   Diabetic Exams:  Diabetic Eye Exam: Completed 02/2023 Diabetic Foot Exam: Overdue, Pt has been advised about the importance in completing this exam. Pt is scheduled for diabetic foot exam on next office visit .   Interpreter Needed?: No  Information entered by :: Renie Ora, LPN   Activities of Daily Living    03/18/2023   10:04 AM  In your present state of health, do you have any difficulty performing the following activities:  Hearing? 0  Vision? 0  Difficulty concentrating or making decisions? 0  Walking or climbing stairs? 0  Dressing or bathing? 0  Doing errands, shopping? 0  Preparing Food and eating ? N  Using the Toilet? N  In the past six months, have you accidently leaked urine? N  Do you have problems with loss of bowel control? N  Managing your Medications? N  Managing your Finances? N  Housekeeping or managing your Housekeeping? N    Patient Care Team: Raliegh Ip, DO as PCP - General  (Family Medicine) Vida Rigger, MD as Consulting Physician (Gastroenterology) Sandford Craze, DC as Referring Physician (Chiropractic Medicine)  Indicate any recent Medical Services you may have received from other than [facility] providers in the past year (date may be approximate).     Assessment:   This is a routine wellness examination for [place].  Hearing/Vision screen Vision Screening - Comments:: Wears rx glasses - up to date with routine eye exams with  [facility] in [place]  Dietary issues and exercise activities discussed:     Goals Addressed             This Visit's Progress    DIET - INCREASE WATER INTAKE         Depression Screen    03/18/2023   10:03 AM 10/09/2022    8:01 AM 07/09/2022   10:14 AM 06/12/2022    8:29 AM 06/01/2022   12:32 PM 03/16/2022    9:46 AM 02/09/2022    8:31 AM  PHQ 2/9 Scores  PHQ - 2 Score 0 0 0 0 0 0 0  PHQ- 9 Score    0 0      Fall Risk    03/18/2023   10:02 AM 10/09/2022    8:01 AM 07/09/2022   10:14 AM 06/12/2022    8:29 AM 06/01/2022   12:11 PM  Fall Risk   Falls in the past year? 0 0 0 0 0  Number falls in past yr: 0      Injury with Fall? 0      Risk for fall due to : No Fall Risks      Follow up Falls prevention discussed        FALL RISK PREVENTION PERTAINING TO THE HOME:  Any stairs in or around the home? Yes  If so, are there any without handrails? No  Home free of loose throw rugs in walkways, pet beds, electrical cords, etc? Yes  Adequate lighting in your home to reduce risk of falls? Yes   ASSISTIVE DEVICES UTILIZED TO PREVENT FALLS:  Life alert? No  Use of a cane, walker or w/c? No  Grab bars in the bathroom? Yes  Shower chair or bench in shower? Yes  Elevated toilet seat or a handicapped toilet? Yes       01/31/2018    9:19 AM 01/27/2017   12:05 PM 01/22/2016   10:32 AM  MMSE - Mini Mental State Exam  Orientation to time 5 5 5   Orientation to Place 5 5 5   Registration 3 3 3   Attention/ Calculation 5 5 4    Recall 3 3 3   Language- name 2 objects 2 2 2   Language- repeat 1 1 1   Language- follow 3 step command 3 3 3   Language- read & follow direction 1 1 1   Write a sentence 1 1 1   Copy design 1 1 1   Total score 30 30 29         03/18/2023   10:05 AM 03/16/2022    9:56 AM 03/11/2020    9:34 AM 03/09/2019    9:39 AM  6CIT Screen  What Year? 0 points 0 points 0 points 0 points  What month? 0 points 0 points 0 points 0 points  What time? 0 points 0 points 0 points 0 points  Count back from 20 0 points 0 points 0 points 0 points  Months in reverse 0 points 0 points 0 points 0 points  Repeat phrase 0 points 0 points 0 points 0 points  Total Score 0 points 0 points 0 points 0 points    Immunizations Immunization History  Administered Date(s) Administered   Fluad Quad(high Dose 65+) 07/21/2019, 07/30/2020, 08/06/2021, 09/29/2022   Influenza, High Dose Seasonal PF 09/08/2017, 09/05/2018   Influenza,inj,Quad PF,6+ Mos 09/08/2013, 08/30/2014, 08/27/2015, 09/02/2016   Moderna Sars-Covid-2 Vaccination 12/21/2019, 01/19/2020, 10/24/2020, 08/21/2021   Pneumococcal Conjugate-13 08/28/2015   Pneumococcal Polysaccharide-23 06/10/2011   Tdap 07/07/2011   Zoster, Live 01/03/2014    TDAP status: Due, Education has been provided regarding the importance of this vaccine. Advised may receive this vaccine at local pharmacy or Health Dept. Aware to provide a copy of the vaccination record if obtained from local pharmacy or Health Dept. Verbalized acceptance and understanding.  Flu Vaccine status: Up to date  Pneumococcal vaccine status: Up to date  Covid-19 vaccine status: Completed vaccines  Qualifies for Shingles Vaccine? Yes   Zostavax completed No   Shingrix Completed?: No.    Education has been provided regarding the importance of this vaccine. Patient has been advised to call insurance company to determine out of pocket expense if they have not yet received this vaccine. Advised may also receive  vaccine at local pharmacy or Health Dept. Verbalized acceptance and understanding.  Screening Tests Health Maintenance  Topic Date Due   Zoster Vaccines- Shingrix (1 of 2) Never done   DTaP/Tdap/Td (2 - Td or Tdap) 07/06/2021   Diabetic kidney evaluation - Urine ACR  07/04/2022   COVID-19 Vaccine (5 - 2023-24 season) 04/03/2023 (Originally 07/10/2022)   HEMOGLOBIN A1C  04/10/2023   INFLUENZA VACCINE  06/10/2023   FOOT EXAM  06/13/2023   MAMMOGRAM  07/16/2023   OPHTHALMOLOGY EXAM  08/21/2023   Diabetic kidney evaluation - eGFR measurement  10/10/2023   DEXA SCAN  02/19/2024   Medicare Annual Wellness (AWV)  03/17/2024  Pneumonia Vaccine 60+ Years old  Completed   Hepatitis C Screening  Completed   HPV VACCINES  Aged Out   Fecal DNA (Cologuard)  Discontinued    Health Maintenance  Health Maintenance Due  Topic Date Due   Zoster Vaccines- Shingrix (1 of 2) Never done   DTaP/Tdap/Td (2 - Td or Tdap) 07/06/2021   Diabetic kidney evaluation - Urine ACR  07/04/2022    Colorectal cancer screening: No longer required.   Mammogram status: No longer required due to age.  Bone Density status: Completed 02/18/2022. Results reflect: Bone density results: OSTEOPOROSIS. Repeat every 2 years.  Lung Cancer Screening: (Low Dose CT Chest recommended if Age 2-80 years, 30 pack-year currently smoking OR have quit w/in 15years.) does not qualify.   Lung Cancer Screening Referral: n/a  Additional Screening:  Hepatitis C Screening: does not qualify; Completed 03/12/2016  Vision Screening: Recommended annual ophthalmology exams for early detection of glaucoma and other disorders of the eye. Is the patient up to date with their annual eye exam?  Yes  Who is the provider or what is the name of the office in which the patient attends annual eye exams? [facility]  If pt is not established with a provider, would they like to be referred to a provider to establish care? No .   Dental Screening:  Recommended annual dental exams for proper oral hygiene  Community Resource Referral / Chronic Care Management: CRR required this visit?  No   CCM required this visit?  No      Plan:     I have personally reviewed and noted the following in the patient's chart:   Medical and social history Use of alcohol, tobacco or illicit drugs  Current medications and supplements including opioid prescriptions. Patient is not currently taking opioid prescriptions. Functional ability and status Nutritional status Physical activity Advanced directives List of other physicians Hospitalizations, surgeries, and ER visits in previous 12 months Vitals Screenings to include cognitive, depression, and falls Referrals and appointments  In addition, I have reviewed and discussed with patient certain preventive protocols, quality metrics, and best practice recommendations. A written personalized care plan for preventive services as well as general preventive health recommendations were provided to patient.     Lorrene Reid, LPN   11/14/1094   Nurse Notes: none

## 2023-04-01 ENCOUNTER — Encounter: Payer: Self-pay | Admitting: Family Medicine

## 2023-04-01 ENCOUNTER — Other Ambulatory Visit: Payer: Self-pay

## 2023-04-01 MED ORDER — GLUCOSE BLOOD VI STRP: ORAL_STRIP | 2 refills | Status: AC

## 2023-04-12 ENCOUNTER — Encounter: Payer: Self-pay | Admitting: Family Medicine

## 2023-04-12 ENCOUNTER — Ambulatory Visit (INDEPENDENT_AMBULATORY_CARE_PROVIDER_SITE_OTHER): Payer: Medicare Other | Admitting: Family Medicine

## 2023-04-12 VITALS — BP 145/66 | HR 72 | Temp 98.3°F | Ht 63.0 in | Wt 174.0 lb

## 2023-04-12 DIAGNOSIS — E66811 Obesity, class 1: Secondary | ICD-10-CM

## 2023-04-12 DIAGNOSIS — E785 Hyperlipidemia, unspecified: Secondary | ICD-10-CM

## 2023-04-12 DIAGNOSIS — E1169 Type 2 diabetes mellitus with other specified complication: Secondary | ICD-10-CM

## 2023-04-12 DIAGNOSIS — E1159 Type 2 diabetes mellitus with other circulatory complications: Secondary | ICD-10-CM | POA: Diagnosis not present

## 2023-04-12 DIAGNOSIS — E669 Obesity, unspecified: Secondary | ICD-10-CM

## 2023-04-12 DIAGNOSIS — E034 Atrophy of thyroid (acquired): Secondary | ICD-10-CM

## 2023-04-12 DIAGNOSIS — J301 Allergic rhinitis due to pollen: Secondary | ICD-10-CM | POA: Diagnosis not present

## 2023-04-12 DIAGNOSIS — I152 Hypertension secondary to endocrine disorders: Secondary | ICD-10-CM | POA: Diagnosis not present

## 2023-04-12 DIAGNOSIS — Z23 Encounter for immunization: Secondary | ICD-10-CM

## 2023-04-12 DIAGNOSIS — M85852 Other specified disorders of bone density and structure, left thigh: Secondary | ICD-10-CM | POA: Diagnosis not present

## 2023-04-12 LAB — BAYER DCA HB A1C WAIVED: HB A1C (BAYER DCA - WAIVED): 6.3 % — ABNORMAL HIGH (ref 4.8–5.6)

## 2023-04-12 LAB — T4, FREE

## 2023-04-12 MED ORDER — LEVOTHYROXINE SODIUM 100 MCG PO TABS: 100.0000 ug | ORAL_TABLET | Freq: Every day | ORAL | 3 refills | Status: DC

## 2023-04-12 MED ORDER — CETIRIZINE HCL 10 MG PO TABS: 5.0000 mg | ORAL_TABLET | Freq: Every day | ORAL | 1 refills | Status: DC | PRN

## 2023-04-12 NOTE — Progress Notes (Signed)
Subjective: CC:DM PCP: Raliegh Ip, DO [id number] Vanessa Hubbard is a 77 y.o. female presenting to clinic today for:  1. Type 2 Diabetes with hypertension, hyperlipidemia:  Glucometer:[employer].  Reports that she has had several holidays over the last month and has not really been compliant with a diet.  She typically eats bread every breakfast.  She does not report any concerning symptoms including blurred vision, polydipsia, polyuria, chest pain, shortness of breath or edema.  She is compliant with her antihypertensive and cholesterol medication.  Diabetes to this point has been diet controlled  Last eye exam: UTD Last foot exam: UTD Last A1c:  Lab Results  Component Value Date   HGBA1C 6.6 (H) 10/09/2022   Nephropathy screen indicated?: UTD Last flu, zoster and/or pneumovax:  Immunization History  Administered Date(s) Administered   Fluad Quad(high Dose 65+) 07/21/2019, 07/30/2020, 08/06/2021, 09/29/2022   Influenza, High Dose Seasonal PF 09/08/2017, 09/05/2018   Influenza,inj,Quad PF,6+ Mos 09/08/2013, 08/30/2014, 08/27/2015, 09/02/2016   Moderna Sars-Covid-2 Vaccination 12/21/2019, 01/19/2020, 10/24/2020, 08/21/2021   Pneumococcal Conjugate-13 08/28/2015   Pneumococcal Polysaccharide-23 06/10/2011   Tdap 07/07/2011   Zoster, Live 01/03/2014    2. Hypothyroidism Needs a new prescription for Synthroid.  No reports of tremor, changes in bowel habits  3. Osteopenia Last DEXA scan showed osteopenia of the femur.  Compliant with vitamin D daily    ROS: Per HPI  No Known Allergies Past Medical History:  Diagnosis Date   Broken ankle left   Diabetes mellitus without complication (HCC)    Fibroids    History of fractured kneecap    Hyperlipidemia    Hypertension    Osteopenia    Thyroid disease     Current Outpatient Medications:    aspirin 81 MG tablet, Take 81 mg by mouth 2 (two) times a week. , Disp: , Rfl:    Vanessa-D ULTRA-FINE 33 LANCETS MISC, Use to check BG  daily (Dx 11.9 type 2 DM), Disp: 100 each, Rfl: 1   cetirizine (ZYRTEC) 10 MG tablet, Take 1 tablet (10 mg total) by mouth daily as needed. Allergies., Disp: 90 tablet, Rfl: 1   Cholecalciferol (VITAMIN D3) 2000 UNITS TABS, Take 1 tablet by mouth., Disp: , Rfl:    fluticasone (FLONASE) 50 MCG/ACT nasal spray, Place 2 sprays into both nostrils daily., Disp: 16 g, Rfl: 6   glucose blood (BAYER CONTOUR NEXT TEST) test strip, Use to check BG once a day. Dx 11.9 - type 2 diabetes, Disp: 50 each, Rfl: 2   hydrochlorothiazide (HYDRODIURIL) 25 MG tablet, Take 1 tablet (25 mg total) by mouth daily., Disp: 90 tablet, Rfl: 3   levothyroxine (SYNTHROID) 100 MCG tablet, Take 1 tablet (100 mcg total) by mouth daily. Except 1/2 on Sunday, Disp: 84 tablet, Rfl: 3   Multiple Vitamin (MULTIVITAMIN) tablet, Take 1 tablet by mouth daily., Disp: , Rfl:    nystatin cream (MYCOSTATIN), Apply 1 Application topically 2 (two) times daily., Disp: 30 g, Rfl: 0   rosuvastatin (CRESTOR) 10 MG tablet, Take 0.5 tablets (5 mg total) by mouth daily., Disp: 45 tablet, Rfl: 3 Social History   Socioeconomic History   Marital status: Married    Spouse name: Fayrene Fearing "Rosanne Ashing" Brisk    Number of children: 3   Years of education: GED   Highest education level: GED or equivalent  Occupational History   Occupation: Retired    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  Tobacco Use   Smoking status: Never  Smokeless tobacco: Never  Vaping Use   Vaping Use: Never used  Substance and Sexual Activity   Alcohol use: No   Drug use: No   Sexual activity: Not Currently  Other Topics Concern   Not on file  Social History Narrative   Lives with her husband in one level home   Social Determinants of Health   Financial Resource Strain: Low Risk  (03/18/2023)   Overall Financial Resource Strain (CARDIA)    Difficulty of Paying Living Expenses: Not hard at all  Food Insecurity: No Food Insecurity (03/18/2023)   Hunger Vital Sign     Worried About Running Out of Food in the Last Year: Never true    Ran Out of Food in the Last Year: Never true  Transportation Needs: No Transportation Needs (03/18/2023)   PRAPARE - Administrator, Civil Service (Medical): No    Lack of Transportation (Non-Medical): No  Physical Activity: Insufficiently Active (03/18/2023)   Exercise Vital Sign    Days of Exercise per Week: 2 days    Minutes of Exercise per Session: 20 min  Stress: No Stress Concern Present (03/18/2023)   [employer] of Occupational Health - Occupational Stress Questionnaire    Feeling of Stress : Not at all  Social Connections: Socially Integrated (03/18/2023)   Social Connection and Isolation Panel [NHANES]    Frequency of Communication with Friends and Family: More than three times a week    Frequency of Social Gatherings with Friends and Family: More than three times a week    Attends Religious Services: More than 4 times per year    Active Member of [employer] or Organizations: Yes    Attends Engineer, structural: More than 4 times per year    Marital Status: Married  Catering manager Violence: Not At Risk (03/18/2023)   Humiliation, Afraid, Rape, and Kick questionnaire    Fear of Current or Ex-Partner: No    Emotionally Abused: No    Physically Abused: No    Sexually Abused: No   Family History  Problem Relation Age of Onset   Jaundice Mother    Cancer Mother        breast   Breast cancer Mother    COPD Father    Heart disease Father        triple bypass   Heart failure Father    Diabetes Father    Thyroid disease Daughter    Allergies Daughter    Alcohol abuse Son    Congenital heart disease Daughter     Objective: Office vital signs reviewed. BP (!) 144/69   Pulse 72   Temp 98.3 F (36.8 C)   Ht 5\' 3"  (1.6 m)   Wt 174 lb (78.9 kg)   SpO2 98%   BMI 30.82 kg/m   Physical Examination:  General: Awake, alert, well nourished, No acute distress HEENT: sclera white, MMM .   No exophthalmos.  No lymphadenopathy in the neck Cardio: regular rate and rhythm, S1S2 heard, no murmurs appreciated Pulm: clear to auscultation bilaterally, no wheezes, rhonchi or rales; normal work of breathing on room air Neuro: No tremor  Assessment/ Plan: 77 y.o. female   Controlled type 2 diabetes mellitus with other specified complication, without long-term current use of insulin (HCC) - Plan: Bayer DCA Hb A1c Waived, Microalbumin / creatinine urine ratio  Hypertension associated with diabetes (HCC) - Plan: CMP14+EGFR  Hyperlipidemia associated with type 2 diabetes mellitus (HCC) - Plan: CMP14+EGFR, Lipid Panel  Hypothyroidism due to acquired atrophy of thyroid - Plan: TSH, T4, Free, levothyroxine (SYNTHROID) 100 MCG tablet, DISCONTINUED: levothyroxine (SYNTHROID) 100 MCG tablet  Obesity (BMI 30.0-34.9) - Plan: VITAMIN D 25 Hydroxy (Vit-D Deficiency, Fractures)  Osteopenia of neck of left femur - Plan: VITAMIN D 25 Hydroxy (Vit-D Deficiency, Fractures)  Seasonal allergic rhinitis due to pollen - Plan: cetirizine (ZYRTEC) 10 MG tablet  Sugar remains under good diet control with A1c going down to 6.3 today.  No changes.  Check lipid, CMP.  Blood pressure upon recheck was controlled.  Check thyroid levels.  Written prescription for thyroid medication provided to patient per her request  Check vitamin D given BMI in obese range and osteopenia of the femur  Zyrtec changed to reflect current usage of 5 mg daily  May follow-up in 6 months, sooner if concerns arise  No orders of the defined types were placed in this encounter.  No orders of the defined types were placed in this encounter.    Raliegh Ip, DO [facility] [phone]

## 2023-04-12 NOTE — Addendum Note (Signed)
Addended by: Waynette Buttery on: 04/12/2023 09:12 AM   Modules accepted: Orders

## 2023-04-12 NOTE — Patient Instructions (Signed)
6.3 A1c

## 2023-04-13 LAB — CMP14+EGFR
ALT: 18 IU/L (ref 0–32)
AST: 20 IU/L (ref 0–40)
Albumin/Globulin Ratio: 2.6 — ABNORMAL HIGH (ref 1.2–2.2)
Albumin: 4.4 g/dL (ref 3.8–4.8)
Alkaline Phosphatase: 81 IU/L (ref 44–121)
BUN/Creatinine Ratio: 12 (ref 12–28)
BUN: 10 mg/dL (ref 8–27)
Bilirubin Total: 1.3 mg/dL — ABNORMAL HIGH (ref 0.0–1.2)
CO2: 27 mmol/L (ref 20–29)
Calcium: 10 mg/dL (ref 8.7–10.3)
Chloride: 103 mmol/L (ref 96–106)
Creatinine, Ser: 0.81 mg/dL (ref 0.57–1.00)
Globulin, Total: 1.7 g/dL (ref 1.5–4.5)
Glucose: 135 mg/dL — ABNORMAL HIGH (ref 70–99)
Potassium: 4.5 mmol/L (ref 3.5–5.2)
Sodium: 143 mmol/L (ref 134–144)
Total Protein: 6.1 g/dL (ref 6.0–8.5)
eGFR: 74 mL/min/{1.73_m2} (ref >59)

## 2023-04-13 LAB — LIPID PANEL
Chol/HDL Ratio: 1.9 ratio (ref 0.0–4.4)
Cholesterol, Total: 129 mg/dL (ref 100–199)
HDL: 68 mg/dL (ref >39)
LDL Chol Calc (NIH): 38 mg/dL (ref 0–99)
Triglycerides: 136 mg/dL (ref 0–149)
VLDL Cholesterol Cal: 23 mg/dL (ref 5–40)

## 2023-04-13 LAB — TSH: TSH: 3.95 u[IU]/mL (ref 0.450–4.500)

## 2023-04-13 LAB — VITAMIN D 25 HYDROXY (VIT D DEFICIENCY, FRACTURES): Vit D, 25-Hydroxy: 45.1 ng/mL (ref 30.0–100.0)

## 2023-05-05 ENCOUNTER — Encounter: Payer: Self-pay | Admitting: Family Medicine

## 2023-07-02 ENCOUNTER — Other Ambulatory Visit: Payer: Self-pay | Admitting: Family Medicine

## 2023-07-02 DIAGNOSIS — Z1231 Encounter for screening mammogram for malignant neoplasm of breast: Secondary | ICD-10-CM

## 2023-07-19 ENCOUNTER — Inpatient Hospital Stay: Admission: RE | Admit: 2023-07-19 | Payer: Medicare Other | Source: Ambulatory Visit

## 2023-08-04 ENCOUNTER — Ambulatory Visit
Admission: RE | Admit: 2023-08-04 | Discharge: 2023-08-04 | Disposition: A | Discharge status: Home / Self Care | Payer: Medicare Other | Source: Ambulatory Visit | Attending: Family Medicine

## 2023-08-04 DIAGNOSIS — Z1231 Encounter for screening mammogram for malignant neoplasm of breast: Secondary | ICD-10-CM | POA: Diagnosis not present

## 2023-08-07 ENCOUNTER — Other Ambulatory Visit: Payer: Self-pay | Admitting: Family Medicine

## 2023-08-07 DIAGNOSIS — I152 Hypertension secondary to endocrine disorders: Secondary | ICD-10-CM

## 2023-08-07 DIAGNOSIS — E1159 Type 2 diabetes mellitus with other circulatory complications: Secondary | ICD-10-CM

## 2023-09-13 ENCOUNTER — Ambulatory Visit (INDEPENDENT_AMBULATORY_CARE_PROVIDER_SITE_OTHER): Payer: Medicare Other

## 2023-09-13 DIAGNOSIS — Z23 Encounter for immunization: Secondary | ICD-10-CM

## 2023-10-10 ENCOUNTER — Other Ambulatory Visit: Payer: Self-pay | Admitting: Family Medicine

## 2023-10-10 DIAGNOSIS — E1169 Type 2 diabetes mellitus with other specified complication: Secondary | ICD-10-CM

## 2023-11-12 ENCOUNTER — Encounter: Payer: Self-pay | Admitting: Family Medicine

## 2023-11-12 ENCOUNTER — Ambulatory Visit (INDEPENDENT_AMBULATORY_CARE_PROVIDER_SITE_OTHER): Payer: No Typology Code available for payment source | Admitting: Family Medicine

## 2023-11-12 VITALS — BP 131/65 | HR 74 | Temp 98.5°F | Ht 63.0 in | Wt 172.0 lb

## 2023-11-12 DIAGNOSIS — I152 Hypertension secondary to endocrine disorders: Secondary | ICD-10-CM | POA: Diagnosis not present

## 2023-11-12 DIAGNOSIS — E66811 Obesity, class 1: Secondary | ICD-10-CM

## 2023-11-12 DIAGNOSIS — E1169 Type 2 diabetes mellitus with other specified complication: Secondary | ICD-10-CM | POA: Diagnosis not present

## 2023-11-12 DIAGNOSIS — E1159 Type 2 diabetes mellitus with other circulatory complications: Secondary | ICD-10-CM | POA: Diagnosis not present

## 2023-11-12 DIAGNOSIS — Z0000 Encounter for general adult medical examination without abnormal findings: Secondary | ICD-10-CM

## 2023-11-12 DIAGNOSIS — E034 Atrophy of thyroid (acquired): Secondary | ICD-10-CM

## 2023-11-12 DIAGNOSIS — J3489 Other specified disorders of nose and nasal sinuses: Secondary | ICD-10-CM

## 2023-11-12 DIAGNOSIS — Z6830 Body mass index (BMI) 30.0-30.9, adult: Secondary | ICD-10-CM

## 2023-11-12 DIAGNOSIS — Z0001 Encounter for general adult medical examination with abnormal findings: Secondary | ICD-10-CM | POA: Diagnosis not present

## 2023-11-12 DIAGNOSIS — E785 Hyperlipidemia, unspecified: Secondary | ICD-10-CM

## 2023-11-12 LAB — BAYER DCA HB A1C WAIVED: HB A1C (BAYER DCA - WAIVED): 6.8 % — ABNORMAL HIGH (ref 4.8–5.6)

## 2023-11-12 MED ORDER — ROSUVASTATIN CALCIUM 10 MG PO TABS: 5.0000 mg | ORAL_TABLET | Freq: Every day | ORAL | 3 refills | Status: DC

## 2023-11-12 MED ORDER — CETIRIZINE HCL 10 MG PO TABS: 5.0000 mg | ORAL_TABLET | Freq: Every day | ORAL | 1 refills | Status: AC | PRN

## 2023-11-12 MED ORDER — HYDROCHLOROTHIAZIDE 25 MG PO TABS: 25.0000 mg | ORAL_TABLET | Freq: Every day | ORAL | 3 refills | Status: DC

## 2023-11-12 MED ORDER — AZELASTINE HCL 0.1 % NA SOLN: 1.0000 | Freq: Two times a day (BID) | NASAL | 12 refills | Status: DC

## 2023-11-12 MED ORDER — LEVOTHYROXINE SODIUM 100 MCG PO TABS: 100.0000 ug | ORAL_TABLET | Freq: Every day | ORAL | 3 refills | Status: DC

## 2023-11-12 NOTE — Progress Notes (Signed)
 Vanessa Hubbard is a 78 y.o. female presents to office today for annual physical exam examination.    Concerns today include: 1. Type 2 Diabetes with hypertension, hyperlipidemia:  Admits that she has not been strict with her diet over the holidays.  Does not report any hypo or hyperglycemic episodes.  Compliant with blood pressure and cholesterol medication.  Last eye exam: Had eye exam done Last foot exam: Needs Last A1c:  Lab Results  Component Value Date   HGBA1C 6.3 (H) 04/12/2023   Nephropathy screen indicated?:  Needs Last flu, zoster and/or pneumovax:  Immunization History  Administered Date(s) Administered   Fluad Quad(high Dose 65+) 07/21/2019, 07/30/2020, 08/06/2021, 09/29/2022   Fluad Trivalent(High Dose 65+) 09/13/2023   Influenza, High Dose Seasonal PF 09/08/2017, 09/05/2018   Influenza,inj,Quad PF,6+ Mos 09/08/2013, 08/30/2014, 08/27/2015, 09/02/2016   Moderna Sars-Covid-2 Vaccination 12/21/2019, 01/19/2020, 10/24/2020, 08/21/2021   Pneumococcal Conjugate-13 08/28/2015   Pneumococcal Polysaccharide-23 06/10/2011   Tdap 07/07/2011, 04/12/2023   Zoster, Live 01/03/2014    ROS: Denies dizziness, LOC, polyuria, polydipsia, unintended weight loss/gain, foot ulcerations, numbness or tingling in extremities, shortness of breath or chest pain.  She has had a little increased rhinorrhea.  This occurs every year.  This been refractory to her home Flonase  and Zyrtec .  2.  Hypothyroidism Compliant with medication.  No reports of tremor, heart palpitations or changes in bowel habits   Occupation: retired, Marital status: married to [place], Substance use: none Health Maintenance Due  Topic Date Due   Diabetic kidney evaluation - Urine ACR  07/04/2022   FOOT EXAM  06/13/2023   HEMOGLOBIN A1C  10/12/2023   Refills needed today: all  Immunization History  Administered Date(s) Administered   Fluad Quad(high Dose 65+) 07/21/2019, 07/30/2020, 08/06/2021, 09/29/2022   Fluad  Trivalent(High Dose 65+) 09/13/2023   Influenza, High Dose Seasonal PF 09/08/2017, 09/05/2018   Influenza,inj,Quad PF,6+ Mos 09/08/2013, 08/30/2014, 08/27/2015, 09/02/2016   Moderna Sars-Covid-2 Vaccination 12/21/2019, 01/19/2020, 10/24/2020, 08/21/2021   Pneumococcal Conjugate-13 08/28/2015   Pneumococcal Polysaccharide-23 06/10/2011   Tdap 07/07/2011, 04/12/2023   Zoster, Live 01/03/2014   Past Medical History:  Diagnosis Date   Broken ankle left   Diabetes mellitus without complication (HCC)    Fibroids    History of fractured kneecap    Hyperlipidemia    Hypertension    Osteopenia    Thyroid  disease    Social History   Socioeconomic History   Marital status: Married    Spouse name: Lennox Leikam    Number of children: 3   Years of education: GED   Highest education level: GED or equivalent  Occupational History   Occupation: Retired    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  Tobacco Use   Smoking status: Never   Smokeless tobacco: Never  Vaping Use   Vaping status: Never Used  Substance and Sexual Activity   Alcohol use: No   Drug use: No   Sexual activity: Not Currently  Other Topics Concern   Not on file  Social History Narrative   Lives with her husband in one level home   Social Drivers of Health   Financial Resource Strain: Low Risk  (03/18/2023)   Overall Financial Resource Strain (CARDIA)    Difficulty of Paying Living Expenses: Not hard at all  Food Insecurity: No Food Insecurity (03/18/2023)   Hunger Vital Sign    Worried About Running Out of Food in the Last Year: Never true    Ran Out  of Food in the Last Year: Never true  Transportation Needs: No Transportation Needs (03/18/2023)   PRAPARE - Administrator, Civil Service (Medical): No    Lack of Transportation (Non-Medical): No  Physical Activity: Insufficiently Active (03/18/2023)   Exercise Vital Sign    Days of Exercise per Week: 2 days    Minutes of Exercise per  Session: 20 min  Stress: No Stress Concern Present (03/18/2023)   [employer] of Occupational Health - Occupational Stress Questionnaire    Feeling of Stress : Not at all  Social Connections: Socially Integrated (03/18/2023)   Social Connection and Isolation Panel [NHANES]    Frequency of Communication with Friends and Family: More than three times a week    Frequency of Social Gatherings with Friends and Family: More than three times a week    Attends Religious Services: More than 4 times per year    Active Member of Clubs or Organizations: Yes    Attends Banker Meetings: More than 4 times per year    Marital Status: Married  Catering Manager Violence: Not At Risk (03/18/2023)   Humiliation, Afraid, Rape, and Kick questionnaire    Fear of Current or Ex-Partner: No    Emotionally Abused: No    Physically Abused: No    Sexually Abused: No   Past Surgical History:  Procedure Laterality Date   ABDOMINAL HYSTERECTOMY     complete    ANKLE SURGERY     BREAST BIOPSY Right 1987   benign   BREAST LUMPECTOMY Right    benign   CHOLECYSTECTOMY     KNEE SURGERY Right    ORIF PATELLA  01/22/2012   Procedure: OPEN REDUCTION INTERNAL (ORIF) FIXATION PATELLA;  Surgeon: Cordella Glendia Hutchinson, MD;  Location: [facility];  Service: Orthopedics;  Laterality: Left;   TONSILLECTOMY     Family History  Problem Relation Age of Onset   Jaundice Mother    Cancer Mother        breast   Breast cancer Mother    COPD Father    Heart disease Father        triple bypass   Heart failure Father    Diabetes Father    Thyroid  disease Daughter    Allergies Daughter    Alcohol abuse Son    Congenital heart disease Daughter     Current Outpatient Medications:    aspirin  81 MG tablet, Take 81 mg by mouth 2 (two) times a week. , Disp: , Rfl:    azelastine  (ASTELIN ) 0.1 % nasal spray, Place 1 spray into both nostrils 2 (two) times daily., Disp: 30 mL, Rfl: 12   B-D ULTRA-FINE 33 LANCETS MISC, Use  to check BG daily (Dx 11.9 type 2 DM), Disp: 100 each, Rfl: 1   Cholecalciferol  (VITAMIN D3) 2000 UNITS TABS, Take 1 tablet by mouth., Disp: , Rfl:    fluticasone  (FLONASE ) 50 MCG/ACT nasal spray, Place 2 sprays into both nostrils daily., Disp: 16 g, Rfl: 6   glucose blood (BAYER CONTOUR NEXT TEST) test strip, Use to check BG once a day. Dx 11.9 - type 2 diabetes, Disp: 50 each, Rfl: 2   Multiple Vitamin (MULTIVITAMIN) tablet, Take 1 tablet by mouth daily., Disp: , Rfl:    rosuvastatin  (CRESTOR ) 10 MG tablet, TAKE 1/2 TABLET BY MOUTH DAILY, Disp: 45 tablet, Rfl: 0   cetirizine  (ZYRTEC ) 10 MG tablet, Take 0.5 tablets (5 mg total) by mouth daily as needed. Allergies., Disp: 90 tablet,  Rfl: 1   hydrochlorothiazide  (HYDRODIURIL ) 25 MG tablet, Take 1 tablet (25 mg total) by mouth daily., Disp: 90 tablet, Rfl: 3   levothyroxine  (SYNTHROID ) 100 MCG tablet, Take 1 tablet (100 mcg total) by mouth daily. Except 1/2 on Sunday, Disp: 84 tablet, Rfl: 3  No Known Allergies   ROS: Review of Systems A comprehensive review of systems was negative except for: Ears, nose, mouth, throat, and face: positive for epistaxis and rhinorrhea    Physical exam BP 131/65   Pulse 74   Temp 98.5 F (36.9 C)   Ht 5' 3 (1.6 m)   SpO2 95%   BMI 30.82 kg/m  General appearance: alert, cooperative, appears stated age, and no distress Head: Normocephalic, without obvious abnormality, atraumatic Eyes: negative findings: lids and lashes normal, conjunctivae and sclerae normal, corneas clear, and pupils equal, round, reactive to light and accomodation Ears: normal TM's and external ear canals both ears Nose: Nares normal. Septum midline. Mucosa normal. No drainage or sinus tenderness. Throat: lips, mucosa, and tongue normal; teeth and gums normal Neck: no adenopathy, supple, symmetrical, trachea midline, and thyroid  not enlarged, symmetric, no tenderness/mass/nodules Back:  Mild increasing kyphosis with right sided thoracic  hump appreciated. Lungs: clear to auscultation bilaterally Heart: regular rate and rhythm, S1, S2 normal, no murmur, click, rub or gallop Abdomen: soft, non-tender; bowel sounds normal; no masses,  no organomegaly Extremities: extremities normal, atraumatic, no cyanosis or edema Pulses: 2+ and symmetric Skin: Skin color, texture, turgor normal. No rashes or lesions Lymph nodes: Cervical, supraclavicular, and axillary nodes normal. Neurologic: Grossly normal   Diabetic Foot Exam - Simple   Simple Foot Form Diabetic Foot exam was performed with the following findings: Yes 11/12/2023  8:32 AM  Visual Inspection See comments: Yes Sensation Testing Intact to touch and monofilament testing bilaterally: Yes Pulse Check Posterior Tibialis and Dorsalis pulse intact bilaterally: Yes Comments Hammertoe and a several digits.  She has a plantar callus along the distal forefoot at approximately digit #3.  No skin breakdown.        11/12/2023    8:07 AM 04/12/2023    8:19 AM 03/18/2023   10:03 AM  Depression screen PHQ 2/9  Decreased Interest 0 0 0  Down, Depressed, Hopeless 0 0 0  PHQ - 2 Score 0 0 0  Altered sleeping 0 0   Tired, decreased energy 0 0   Change in appetite 0 0   Feeling bad or failure about yourself  0 0   Trouble concentrating 0 0   Moving slowly or fidgety/restless 0 0   Suicidal thoughts 0 0   PHQ-9 Score 0 0   Difficult doing work/chores  Not difficult at all       11/12/2023    8:07 AM 10/09/2022    8:01 AM 06/12/2022    8:29 AM 06/01/2022   12:32 PM  GAD 7 : Generalized Anxiety Score  Nervous, Anxious, on Edge 0 0 0 0  Control/stop worrying 0 0 0 0  Worry too much - different things 0 0 0 0  Trouble relaxing 0 0 0 0  Restless 0 0 0 0  Easily annoyed or irritable 0 0 0 0  Afraid - awful might happen 0 0 0 0  Total GAD 7 Score 0 0 0 0  Anxiety Difficulty  Not difficult at all Not difficult at all Not difficult at all     Assessment/ Plan: Meade KATHEE Lesches here  for annual physical exam.  Annual physical exam  Controlled type 2 diabetes mellitus with other specified complication, without long-term current use of insulin (HCC) - Plan: Bayer DCA Hb A1c Waived, Microalbumin / creatinine urine ratio  Hypertension associated with diabetes (HCC) - Plan: CMP14+EGFR, hydrochlorothiazide  (HYDRODIURIL ) 25 MG tablet  Hyperlipidemia associated with type 2 diabetes mellitus (HCC) - Plan: rosuvastatin  (CRESTOR ) 10 MG tablet  Obesity (BMI 30.0-34.9)  Hypothyroidism due to acquired atrophy of thyroid  - Plan: TSH, T4, free, levothyroxine  (SYNTHROID ) 100 MCG tablet  Rhinorrhea - Plan: cetirizine  (ZYRTEC ) 10 MG tablet, azelastine  (ASTELIN ) 0.1 % nasal spray  Sugar remains controlled A1c of 6.8 today.  Foot exam performed.  Urine microalbumin collected.  Blood pressure well-controlled.  Continue current regimen.  Refill sent  Not due for fasting lipid.  Continue statin  Restriction of carbohydrates and increase physical exercise recommended to reduce BMI and reduce blood sugar  Check thyroid  levels.  Synthroid  renewed  Zyrtec  renewed.  Trial of Astelin  for rhinorrhea.  Okay to hold Flonase  given reports of epistaxis  Counseled on healthy lifestyle choices, including diet (rich in fruits, vegetables and lean meats and low in salt and simple carbohydrates) and exercise (at least 30 minutes of moderate physical activity daily).  Patient to follow up 72m for DM  Welda Azzarello M. Jolinda, DO

## 2023-11-13 LAB — MICROALBUMIN / CREATININE URINE RATIO
Creatinine, Urine: 169.7 mg/dL
Microalb/Creat Ratio: 7 mg/g{creat} (ref 0–29)
Microalbumin, Urine: 12.6 ug/mL

## 2023-11-13 LAB — CMP14+EGFR
ALT: 15 IU/L (ref 0–32)
AST: 22 IU/L (ref 0–40)
Albumin: 4.5 g/dL (ref 3.8–4.8)
Alkaline Phosphatase: 84 IU/L (ref 44–121)
BUN/Creatinine Ratio: 18 (ref 12–28)
BUN: 14 mg/dL (ref 8–27)
Bilirubin Total: 1.3 mg/dL — ABNORMAL HIGH (ref 0.0–1.2)
CO2: 26 mmol/L (ref 20–29)
Calcium: 9.4 mg/dL (ref 8.7–10.3)
Chloride: 101 mmol/L (ref 96–106)
Creatinine, Ser: 0.77 mg/dL (ref 0.57–1.00)
Globulin, Total: 1.9 g/dL (ref 1.5–4.5)
Glucose: 140 mg/dL — ABNORMAL HIGH (ref 70–99)
Potassium: 4 mmol/L (ref 3.5–5.2)
Sodium: 143 mmol/L (ref 134–144)
Total Protein: 6.4 g/dL (ref 6.0–8.5)
eGFR: 78 mL/min/{1.73_m2} (ref >59)

## 2023-11-13 LAB — T4, FREE: Free T4: 1.39 ng/dL (ref 0.82–1.77)

## 2023-11-13 LAB — TSH: TSH: 9.75 u[IU]/mL — ABNORMAL HIGH (ref 0.450–4.500)

## 2023-11-16 ENCOUNTER — Ambulatory Visit: Payer: Self-pay | Admitting: Family Medicine

## 2023-11-16 NOTE — Telephone Encounter (Signed)
 Copied from CRM [id number]. Topic: Clinical - Lab/Test Results >> Nov 16, 2023  4:46 PM Chase C wrote: Reason for CRM: Patient called in returning phone call regarding her blood lab results. Transferred to Nurse Triage Line.   Additional Notes: Patient stating she is returning a call for results. This RN provided results to patient as stated in result note from Dr. Jolinda on 1/7. Dr. Jolinda mentioned She taking her thyroid  medicine daily as prescribed with no missed doses? Patient reported that she is taking it as prescribed and has not missed any doses. Patient aware that a message is being sent and the office will follow up with her.   Reason for Disposition  [1] Follow-up call to recent contact AND [2] information only call, no triage required  Protocols used: Information Only Call - No Triage-A-AH

## 2023-11-19 ENCOUNTER — Encounter: Payer: Self-pay | Admitting: Family Medicine

## 2023-11-25 ENCOUNTER — Telehealth: Payer: Self-pay

## 2023-11-25 NOTE — Telephone Encounter (Signed)
Copied from CRM [id number]. Topic: Clinical - Lab/Test Results >> Nov 25, 2023 12:22 PM Antony Haste wrote: Reason for CRM: This patient wanted to follow-up on the missed call she received today for her lab results. Provided the patient with the results shown on 01/07, the patient states she is taking her thyroid medication daily as prescribed with no missed doses.

## 2023-11-26 NOTE — Telephone Encounter (Signed)
I believe she has been taking med daily except 1/2 on Sun correct?  If so, have her take 1 daily. Also, please confirm not taking any hair/ skin nail vitamins/ biotin containing products.

## 2023-11-29 NOTE — Telephone Encounter (Signed)
Pt states taht she was taking 1/2 on Sundays. Pt will start taking one daily. Pt states she is not taking any of the supplements mentioned.

## 2023-11-29 NOTE — Telephone Encounter (Signed)
Ok. Sounds good. Just make sure she is taking 1 daily now. We'll recheck those levels at next OV

## 2023-12-06 ENCOUNTER — Encounter: Payer: Self-pay | Admitting: Family Medicine

## 2024-01-03 ENCOUNTER — Telehealth: Payer: Self-pay | Admitting: *Deleted

## 2024-01-03 DIAGNOSIS — E1169 Type 2 diabetes mellitus with other specified complication: Secondary | ICD-10-CM

## 2024-01-03 MED ORDER — ROSUVASTATIN CALCIUM 10 MG PO TABS: 10.0000 mg | ORAL_TABLET | Freq: Every day | ORAL | 3 refills | Status: DC

## 2024-01-03 NOTE — Addendum Note (Signed)
 Addended by: Raliegh Ip on: 01/03/2024 02:56 PM   Modules accepted: Orders

## 2024-01-03 NOTE — Telephone Encounter (Signed)
 Fax from [facility] pharmacy RE: Crestor 10 mg 1/2 every day Note from pharmacy: pt states she takes 1 every day Please advise and send new script if appopriate

## 2024-02-28 DIAGNOSIS — K080 Exfoliation of teeth due to systemic causes: Secondary | ICD-10-CM | POA: Diagnosis not present

## 2024-03-20 ENCOUNTER — Ambulatory Visit: Payer: Medicare Other

## 2024-03-20 VITALS — BP 131/65 | HR 74 | Ht 63.0 in | Wt 172.0 lb

## 2024-03-20 DIAGNOSIS — Z0000 Encounter for general adult medical examination without abnormal findings: Secondary | ICD-10-CM

## 2024-03-20 DIAGNOSIS — Z13820 Encounter for screening for osteoporosis: Secondary | ICD-10-CM

## 2024-03-20 NOTE — Patient Instructions (Signed)
 Ms. Vanessa Hubbard , Thank you for taking time out of your busy schedule to complete your Annual Wellness Visit with me. I enjoyed our conversation and look forward to speaking with you again next year. I, as well as your care team,  appreciate your ongoing commitment to your health goals. Please review the following plan we discussed and let me know if I can assist you in the future.   Follow up Visits: Next Medicare AWV with our clinical staff: Wed.,03/21/25 at 12:30p.m.   Next Office Visit with your provider: 05/16/24 at 8:30a.m.  Clinician Recommendations:  Aim for 30 minutes of exercise or brisk walking, 6-8 glasses of water, and 5 servings of fruits and vegetables each day. Please make sure to get the following done at your next visit: Shingles vaccine, schedule for your diabetic eye exam and Dexa Scan (Bone Density) exam. If you have any questions please contact your provider's office at [phone].      This is a list of the screening recommended for you and due dates:  Health Maintenance  Topic Date Due   Zoster (Shingles) Vaccine (1 of 2) 01/30/1994   Eye exam for diabetics  08/21/2023   DEXA scan (bone density measurement)  02/19/2024   COVID-19 Vaccine (5 - 2024-25 season) 04/05/2025*   Hemoglobin A1C  05/11/2024   Flu Shot  06/09/2024   Mammogram  08/03/2024   Yearly kidney function blood test for diabetes  11/11/2024   Yearly kidney health urinalysis for diabetes  11/11/2024   Complete foot exam   11/11/2024   Medicare Annual Wellness Visit  03/20/2025   DTaP/Tdap/Td vaccine (3 - Td or Tdap) 04/11/2033   Pneumonia Vaccine  Completed   HPV Vaccine  Aged Out   Meningitis B Vaccine  Aged Out   Hepatitis C Screening  Discontinued   Cologuard (Stool DNA test)  Discontinued  *Topic was postponed. The date shown is not the original due date.    Advanced directives: (Declined) Advance directive discussed with you today. Even though you declined this today, please call our office should  you change your mind, and we can give you the proper paperwork for you to fill out. Advance Care Planning is important because it:  [x]  Makes sure you receive the medical care that is consistent with your values, goals, and preferences  [x]  It provides guidance to your family and loved ones and reduces their decisional burden about whether or not they are making the right decisions based on your wishes.  Follow the link provided in your after visit summary or read over the paperwork we have mailed to you to help you started getting your Advance Directives in place. If you need assistance in completing these, please reach out to us  so that we can help you!  See attachments for Preventive Care and Fall Prevention Tips.

## 2024-03-20 NOTE — Progress Notes (Cosign Needed)
 Subjective:   Vanessa Hubbard is a 78 y.o. who presents for a Medicare Wellness preventive visit.  As a reminder, Annual Wellness Visits don't include a physical exam, and some assessments may be limited, especially if this visit is performed virtually. We may recommend an in-person visit if needed.  Visit Complete: Virtual I connected with  Vanessa Hubbard on 03/20/24 by a audio enabled telemedicine application and verified that I am speaking with the correct person using two identifiers.  Patient Location: Home  Provider Location: Home Office  I discussed the limitations of evaluation and management by telemedicine. The patient expressed understanding and agreed to proceed.  Vital Signs: Because this visit was a virtual/telehealth visit, some criteria may be missing or patient reported. Any vitals not documented were not able to be obtained and vitals that have been documented are patient reported.  VideoDeclined- This patient declined Librarian, academic. Therefore the visit was completed with audio only.  Persons Participating in Visit: Patient.  AWV Questionnaire: No: Patient Medicare AWV questionnaire was not completed prior to this visit.  Cardiac Risk Factors include: advanced age (>81men, >28 women);obesity (BMI >30kg/m2);dyslipidemia;hypertension     Objective:     Today's Vitals   03/20/24 1259  BP: 131/65  Pulse: 74  Weight: 172 lb (78 kg)  Height: 5\' 3"  (1.6 m)   Body mass index is 30.47 kg/m.     03/20/2024    1:05 PM 03/18/2023   10:04 AM 03/16/2022    9:48 AM 03/13/2021    9:51 AM 03/11/2020    9:33 AM 03/09/2019    9:33 AM 01/31/2018    8:59 AM  Advanced Directives  Does Patient Have a Medical Advance Directive? No No Yes No No No No  Type of Surveyor, minerals;Living will      Copy of Healthcare Power of Attorney in Chart?   No - copy requested      Would patient like information on creating a  medical advance directive?  No - Patient declined  No - Patient declined Yes (MAU/Ambulatory/Procedural Areas - Information given) No - Patient declined No - Patient declined    Current Medications (verified) Outpatient Encounter Medications as of 03/20/2024  Medication Sig   aspirin  81 MG tablet Take 81 mg by mouth 2 (two) times a week.    azelastine  (ASTELIN ) 0.1 % nasal spray Place 1 spray into both nostrils 2 (two) times daily.   B-D ULTRA-FINE 33 LANCETS MISC Use to check BG daily (Dx 11.9 type 2 DM)   cetirizine  (ZYRTEC ) 10 MG tablet Take 0.5 tablets (5 mg total) by mouth daily as needed. Allergies.   Cholecalciferol  (VITAMIN D3) 2000 UNITS TABS Take 1 tablet by mouth.   fluticasone  (FLONASE ) 50 MCG/ACT nasal spray Place 2 sprays into both nostrils daily.   glucose blood (BAYER CONTOUR NEXT TEST) test strip Use to check BG once a day. Dx 11.9 - type 2 diabetes   hydrochlorothiazide  (HYDRODIURIL ) 25 MG tablet Take 1 tablet (25 mg total) by mouth daily.   levothyroxine  (SYNTHROID ) 100 MCG tablet Take 1 tablet (100 mcg total) by mouth daily. Except 1/2 on Sunday   Multiple Vitamin (MULTIVITAMIN) tablet Take 1 tablet by mouth daily.   rosuvastatin  (CRESTOR ) 10 MG tablet Take 1 tablet (10 mg total) by mouth daily.   No facility-administered encounter medications on file as of 03/20/2024.    Allergies (verified) Patient has no known allergies.   History:  Past Medical History:  Diagnosis Date   Broken ankle left   Diabetes mellitus without complication (HCC)    Fibroids    History of fractured kneecap    Hyperlipidemia    Hypertension    Osteopenia    Thyroid  disease    Past Surgical History:  Procedure Laterality Date   ABDOMINAL HYSTERECTOMY     complete    ANKLE SURGERY     BREAST BIOPSY Right 1987   benign   BREAST LUMPECTOMY Right    benign   CHOLECYSTECTOMY     KNEE SURGERY Right    ORIF PATELLA  01/22/2012   Procedure: OPEN REDUCTION INTERNAL (ORIF) FIXATION  PATELLA;  Surgeon: Jasmine Mesi, MD;  Location: [facility];  Service: Orthopedics;  Laterality: Left;   TONSILLECTOMY     Family History  Problem Relation Age of Onset   Jaundice Mother    Cancer Mother        breast   Breast cancer Mother    COPD Father    Heart disease Father        triple bypass   Heart failure Father    Diabetes Father    Thyroid  disease Daughter    Allergies Daughter    Alcohol abuse Son    Congenital heart disease Daughter    Social History   Socioeconomic History   Marital status: Married    Spouse name: Royston Cornea "Josiah Nigh" Doffing    Number of children: 3   Years of education: GED   Highest education level: GED or equivalent  Occupational History   Occupation: Retired    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  Tobacco Use   Smoking status: Never   Smokeless tobacco: Never  Vaping Use   Vaping status: Never Used  Substance and Sexual Activity   Alcohol use: No   Drug use: No   Sexual activity: Not Currently  Other Topics Concern   Not on file  Social History Narrative   Lives with her husband in one level home   Social Drivers of Health   Financial Resource Strain: Low Risk  (03/20/2024)   Overall Financial Resource Strain (CARDIA)    Difficulty of Paying Living Expenses: Not hard at all  Food Insecurity: No Food Insecurity (03/20/2024)   Hunger Vital Sign    Worried About Running Out of Food in the Last Year: Never true    Ran Out of Food in the Last Year: Never true  Transportation Needs: No Transportation Needs (03/20/2024)   PRAPARE - Administrator, Civil Service (Medical): No    Lack of Transportation (Non-Medical): No  Physical Activity: Insufficiently Active (03/20/2024)   Exercise Vital Sign    Days of Exercise per Week: 7 days    Minutes of Exercise per Session: 20 min  Stress: No Stress Concern Present (03/20/2024)   [employer] of Occupational Health - Occupational Stress Questionnaire    Feeling of  Stress : Only a little  Social Connections: Socially Integrated (03/20/2024)   Social Connection and Isolation Panel [NHANES]    Frequency of Communication with Friends and Family: More than three times a week    Frequency of Social Gatherings with Friends and Family: More than three times a week    Attends Religious Services: More than 4 times per year    Active Member of [employer] or Organizations: Yes    Attends Banker Meetings: More than 4 times per year  Marital Status: Married    Tobacco Counseling Counseling given: No    Clinical Intake:  Pre-visit preparation completed: Yes  Pain : No/denies pain     BMI - recorded: 30.47 Nutritional Status: BMI > 30  Obese Nutritional Risks: None Diabetes: No (per pt boarderline at this time is well control via diet)  Lab Results  Component Value Date   HGBA1C 6.8 (H) 11/12/2023   HGBA1C 6.3 (H) 04/12/2023   HGBA1C 6.6 (H) 10/09/2022     How often do you need to have someone help you when you read instructions, pamphlets, or other written materials from your doctor or pharmacy?: 1 - Never  Interpreter Needed?: No  Information entered by :: Anola Basques T/CMA   Activities of Daily Living     03/20/2024    1:04 PM  In your present state of health, do you have any difficulty performing the following activities:  Hearing? 0  Vision? 0  Difficulty concentrating or making decisions? 0  Walking or climbing stairs? 0  Dressing or bathing? 0  Doing errands, shopping? 0  Preparing Food and eating ? N  Using the Toilet? N  In the past six months, have you accidently leaked urine? N  Do you have problems with loss of bowel control? N  Managing your Medications? N  Managing your Finances? N  Housekeeping or managing your Housekeeping? N    Patient Care Team: Eliodoro Guerin, DO as PCP - General (Family Medicine) Ozell Blunt, MD as Consulting Physician (Gastroenterology) Sunnie England, DC as Referring Physician  (Chiropractic Medicine)  Indicate any recent Medical Services you may have received from other than [facility] providers in the past year (date may be approximate).     Assessment:    This is a routine wellness examination for [place].  Hearing/Vision screen Hearing Screening - Comments:: Pt denies hearing dif Vision Screening - Comments:: Pt wears glasses and goes to [facility] in [place], [place]   Goals Addressed             This Visit's Progress    DIET - INCREASE LEAN PROTEINS   On track    03/16/2022 AWV Goal: Improved Nutrition/Diet  Patient will verbalize understanding that diet plays an important role in overall health and that a poor diet is a risk factor for many chronic medical conditions.  Over the next year, patient will improve self management of their diet by incorporating improved meal pattern, increased physical activity, improved protein intake, chew all foods well, and eat 3 meals daily. Patient will utilize available community resources to help with food acquisition if needed (ex: food pantries, [address], etc) Patient will work with nutrition specialist if a referral was made      DIET - INCREASE WATER INTAKE   On track      Depression Screen     03/20/2024    1:09 PM 11/12/2023    8:07 AM 04/12/2023    8:19 AM 03/18/2023   10:03 AM 10/09/2022    8:01 AM 07/09/2022   10:14 AM 06/12/2022    8:29 AM  PHQ 2/9 Scores  PHQ - 2 Score 0 0 0 0 0 0 0  PHQ- 9 Score 1 0 0    0    Fall Risk     03/20/2024    1:03 PM 11/12/2023    8:07 AM 04/12/2023    8:19 AM 03/18/2023   10:02 AM 10/09/2022    8:01 AM  Fall Risk  Falls in the past year? 0 0 0 0 0  Number falls in past yr: 0 0 0 0   Injury with Fall? 0 0 0 0   Risk for fall due to : No Fall Risks No Fall Risks No Fall Risks No Fall Risks   Follow up Falls prevention discussed;Falls evaluation completed Falls evaluation completed Education provided Falls prevention discussed     MEDICARE RISK AT HOME:  Medicare Risk at  Home Any stairs in or around the home?: Yes If so, are there any without handrails?: Yes Home free of loose throw rugs in walkways, pet beds, electrical cords, etc?: Yes Adequate lighting in your home to reduce risk of falls?: Yes Life alert?: No Use of a cane, walker or w/c?: No Grab bars in the bathroom?: Yes Shower chair or bench in shower?: Yes Elevated toilet seat or a handicapped toilet?: No  TIMED UP AND GO:  Was the test performed?  no  Cognitive Function: 6CIT completed    01/31/2018    9:19 AM 01/27/2017   12:05 PM 01/22/2016   10:32 AM  MMSE - Mini Mental State Exam  Orientation to time 5 5 5   Orientation to Place 5 5 5   Registration 3 3 3   Attention/ Calculation 5 5 4   Recall 3 3 3   Language- name 2 objects 2 2 2   Language- repeat 1 1 1   Language- follow 3 step command 3 3 3   Language- read & follow direction 1 1 1   Write a sentence 1 1 1   Copy design 1 1 1   Total score 30 30 29         03/20/2024    1:11 PM 03/18/2023   10:05 AM 03/16/2022    9:56 AM 03/11/2020    9:34 AM 03/09/2019    9:39 AM  6CIT Screen  What Year? 0 points 0 points 0 points 0 points 0 points  What month? 0 points 0 points 0 points 0 points 0 points  What time? 0 points 0 points 0 points 0 points 0 points  Count back from 20 0 points 0 points 0 points 0 points 0 points  Months in reverse 0 points 0 points 0 points 0 points 0 points  Repeat phrase 0 points 0 points 0 points 0 points 0 points  Total Score 0 points 0 points 0 points 0 points 0 points    Immunizations Immunization History  Administered Date(s) Administered   Fluad Quad(high Dose 65+) 07/21/2019, 07/30/2020, 08/06/2021, 09/29/2022   Fluad Trivalent(High Dose 65+) 09/13/2023   Influenza, High Dose Seasonal PF 09/08/2017, 09/05/2018   Influenza,inj,Quad PF,6+ Mos 09/08/2013, 08/30/2014, 08/27/2015, 09/02/2016   Moderna Sars-Covid-2 Vaccination 12/21/2019, 01/19/2020, 10/24/2020, 08/21/2021   Pneumococcal Conjugate-13  08/28/2015   Pneumococcal Polysaccharide-23 06/10/2011   Tdap 07/07/2011, 04/12/2023   Zoster, Live 01/03/2014    Screening Tests Health Maintenance  Topic Date Due   Zoster Vaccines- Shingrix (1 of 2) 01/30/1994   OPHTHALMOLOGY EXAM  08/21/2023   DEXA SCAN  02/19/2024   COVID-19 Vaccine (5 - 2024-25 season) 04/05/2025 (Originally 07/11/2023)   HEMOGLOBIN A1C  05/11/2024   INFLUENZA VACCINE  06/09/2024   MAMMOGRAM  08/03/2024   Diabetic kidney evaluation - eGFR measurement  11/11/2024   Diabetic kidney evaluation - Urine ACR  11/11/2024   FOOT EXAM  11/11/2024   Medicare Annual Wellness (AWV)  03/20/2025   DTaP/Tdap/Td (3 - Td or Tdap) 04/11/2033   Pneumonia Vaccine 89+ Years old  Completed   HPV  VACCINES  Aged Out   Meningococcal B Vaccine  Aged Out   Hepatitis C Screening  Discontinued   Fecal DNA (Cologuard)  Discontinued    Health Maintenance  Health Maintenance Due  Topic Date Due   Zoster Vaccines- Shingrix (1 of 2) 01/30/1994   OPHTHALMOLOGY EXAM  08/21/2023   DEXA SCAN  02/19/2024   Health Maintenance Items Addressed: See Nurse Notes  Additional Screening:  Vision Screening: Recommended annual ophthalmology exams for early detection of glaucoma and other disorders of the eye.  Dental Screening: Recommended annual dental exams for proper oral hygiene  Community Resource Referral / Chronic Care Management: CRR required this visit?  No   CCM required this visit?  No   Plan:    I have personally reviewed and noted the following in the patient's chart:   Medical and social history Use of alcohol, tobacco or illicit drugs  Current medications and supplements including opioid prescriptions. Patient is not currently taking opioid prescriptions. Functional ability and status Nutritional status Physical activity Advanced directives List of other physicians Hospitalizations, surgeries, and ER visits in previous 12 months Vitals Screenings to include  cognitive, depression, and falls Referrals and appointments  In addition, I have reviewed and discussed with patient certain preventive protocols, quality metrics, and best practice recommendations. A written personalized care plan for preventive services as well as general preventive health recommendations were provided to patient.   Michaelle Adolphus, CMA   03/20/2024   After Visit Summary: (MyChart) Due to this being a telephonic visit, the after visit summary with patients personalized plan was offered to patient via MyChart   Notes: Please make sure to get the following done at your next visit: Shingles vaccine, schedule for your diabetic eye exam and Dexa Scan (Bone Density) exam.

## 2024-03-30 NOTE — Progress Notes (Signed)
I have reviewed and agree with the above  documentation.   Clark Clowdus, FNP   

## 2024-04-05 ENCOUNTER — Encounter: Payer: Self-pay | Admitting: Family Medicine

## 2024-04-18 ENCOUNTER — Other Ambulatory Visit: Payer: Self-pay | Admitting: Family Medicine

## 2024-04-18 DIAGNOSIS — E034 Atrophy of thyroid (acquired): Secondary | ICD-10-CM

## 2024-05-16 ENCOUNTER — Ambulatory Visit (INDEPENDENT_AMBULATORY_CARE_PROVIDER_SITE_OTHER): Payer: Medicare Other | Admitting: Family Medicine

## 2024-05-16 ENCOUNTER — Ambulatory Visit (INDEPENDENT_AMBULATORY_CARE_PROVIDER_SITE_OTHER)

## 2024-05-16 ENCOUNTER — Encounter: Payer: Self-pay | Admitting: Family Medicine

## 2024-05-16 VITALS — BP 127/65 | HR 71 | Temp 97.9°F | Ht 63.0 in | Wt 170.0 lb

## 2024-05-16 DIAGNOSIS — Z13820 Encounter for screening for osteoporosis: Secondary | ICD-10-CM

## 2024-05-16 DIAGNOSIS — E1169 Type 2 diabetes mellitus with other specified complication: Secondary | ICD-10-CM | POA: Diagnosis not present

## 2024-05-16 DIAGNOSIS — Z0000 Encounter for general adult medical examination without abnormal findings: Secondary | ICD-10-CM

## 2024-05-16 DIAGNOSIS — M85852 Other specified disorders of bone density and structure, left thigh: Secondary | ICD-10-CM

## 2024-05-16 DIAGNOSIS — E785 Hyperlipidemia, unspecified: Secondary | ICD-10-CM

## 2024-05-16 DIAGNOSIS — I152 Hypertension secondary to endocrine disorders: Secondary | ICD-10-CM

## 2024-05-16 DIAGNOSIS — Z780 Asymptomatic menopausal state: Secondary | ICD-10-CM | POA: Diagnosis not present

## 2024-05-16 DIAGNOSIS — E034 Atrophy of thyroid (acquired): Secondary | ICD-10-CM

## 2024-05-16 DIAGNOSIS — E1159 Type 2 diabetes mellitus with other circulatory complications: Secondary | ICD-10-CM | POA: Diagnosis not present

## 2024-05-16 DIAGNOSIS — L720 Epidermal cyst: Secondary | ICD-10-CM

## 2024-05-16 LAB — BAYER DCA HB A1C WAIVED: HB A1C (BAYER DCA - WAIVED): 6.9 % — ABNORMAL HIGH (ref 4.8–5.6)

## 2024-05-16 NOTE — Progress Notes (Signed)
 Subjective: CC:DM PCP: Jolinda Norene HERO, DO YEP:Vanessa Hubbard is a 78 y.o. female presenting to clinic today for:  1. Type 2 Diabetes with hypertension, hyperlipidemia:  Occasionally does fingerstick glucose and on average she sees 1 20-1 50s.  Denies anything below 70 or above 200.  Has not scheduled diabetic eye exam but will get done Diabetes Health Maintenance Due  Topic Date Due   OPHTHALMOLOGY EXAM  08/21/2023   HEMOGLOBIN A1C  05/11/2024   FOOT EXAM  11/11/2024    Last A1c:  Lab Results  Component Value Date   HGBA1C 6.8 (H) 11/12/2023    ROS: Denies dizziness, LOC, polyuria, polydipsia, unintended weight loss/gain, foot ulcerations, numbness or tingling in extremities, shortness of breath or chest pain.  She reports a spot on her back and has gotten slightly better but still itches and bothers her sometimes.  Just wanted to have it looked at today  2.  Hypothyroidism Compliant with medication.  No report of tremor, heart palpitations or changes in bowel habits.   ROS: Per HPI  No Known Allergies Past Medical History:  Diagnosis Date   Broken ankle left   Diabetes mellitus without complication (HCC)    Fibroids    History of fractured kneecap    Hyperlipidemia    Hypertension    Osteopenia    Thyroid  disease     Current Outpatient Medications:    aspirin  81 MG tablet, Take 81 mg by mouth 2 (two) times a week. , Disp: , Rfl:    azelastine  (ASTELIN ) 0.1 % nasal spray, Place 1 spray into both nostrils 2 (two) times daily., Disp: 30 mL, Rfl: 12   B-D ULTRA-FINE 33 LANCETS MISC, Use to check BG daily (Dx 11.9 type 2 DM), Disp: 100 each, Rfl: 1   cetirizine  (ZYRTEC ) 10 MG tablet, Take 0.5 tablets (5 mg total) by mouth daily as needed. Allergies., Disp: 90 tablet, Rfl: 1   Cholecalciferol  (VITAMIN D3) 2000 UNITS TABS, Take 1 tablet by mouth., Disp: , Rfl:    fluticasone  (FLONASE ) 50 MCG/ACT nasal spray, Place 2 sprays into both nostrils daily., Disp: 16 g, Rfl:  6   glucose blood (BAYER CONTOUR NEXT TEST) test strip, Use to check BG once a day. Dx 11.9 - type 2 diabetes, Disp: 50 each, Rfl: 2   hydrochlorothiazide  (HYDRODIURIL ) 25 MG tablet, Take 1 tablet (25 mg total) by mouth daily., Disp: 90 tablet, Rfl: 3   levothyroxine  (SYNTHROID ) 100 MCG tablet, Take 1 tablet (100 mcg total) by mouth daily before breakfast., Disp: 90 tablet, Rfl: 0   Multiple Vitamin (MULTIVITAMIN) tablet, Take 1 tablet by mouth daily., Disp: , Rfl:    rosuvastatin  (CRESTOR ) 10 MG tablet, Take 1 tablet (10 mg total) by mouth daily., Disp: 90 tablet, Rfl: 3 Social History   Socioeconomic History   Marital status: Married    Spouse name: Towanda Hornstein    Number of children: 3   Years of education: GED   Highest education level: GED or equivalent  Occupational History   Occupation: Retired    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  Tobacco Use   Smoking status: Never   Smokeless tobacco: Never  Vaping Use   Vaping status: Never Used  Substance and Sexual Activity   Alcohol use: No   Drug use: No   Sexual activity: Not Currently  Other Topics Concern   Not on file  Social History Narrative   Lives with her husband in  one level home   Social Drivers of Health   Financial Resource Strain: Low Risk  (03/20/2024)   Overall Financial Resource Strain (CARDIA)    Difficulty of Paying Living Expenses: Not hard at all  Food Insecurity: No Food Insecurity (03/20/2024)   Hunger Vital Sign    Worried About Running Out of Food in the Last Year: Never true    Ran Out of Food in the Last Year: Never true  Transportation Needs: No Transportation Needs (03/20/2024)   PRAPARE - Administrator, Civil Service (Medical): No    Lack of Transportation (Non-Medical): No  Physical Activity: Insufficiently Active (03/20/2024)   Exercise Vital Sign    Days of Exercise per Week: 7 days    Minutes of Exercise per Session: 20 min  Stress: No Stress Concern  Present (03/20/2024)   [employer] of Occupational Health - Occupational Stress Questionnaire    Feeling of Stress : Only a little  Social Connections: Socially Integrated (03/20/2024)   Social Connection and Isolation Panel    Frequency of Communication with Friends and Family: More than three times a week    Frequency of Social Gatherings with Friends and Family: More than three times a week    Attends Religious Services: More than 4 times per year    Active Member of [employer] or Organizations: Yes    Attends Engineer, structural: More than 4 times per year    Marital Status: Married  Catering manager Violence: Not At Risk (03/20/2024)   Humiliation, Afraid, Rape, and Kick questionnaire    Fear of Current or Ex-Partner: No    Emotionally Abused: No    Physically Abused: No    Sexually Abused: No   Family History  Problem Relation Age of Onset   Jaundice Mother    Cancer Mother        breast   Breast cancer Mother    COPD Father    Heart disease Father        triple bypass   Heart failure Father    Diabetes Father    Thyroid  disease Daughter    Allergies Daughter    Alcohol abuse Son    Congenital heart disease Daughter     Objective: Office vital signs reviewed. BP 127/65   Pulse 71   Temp 97.9 F (36.6 C)   Ht 5' 3 (1.6 m)   Wt 170 lb (77.1 kg)   SpO2 98%   BMI 30.11 kg/m   Physical Examination:  General: Awake, alert, well nourished, No acute distress HEENT: Sclera white.  Moist mucous membranes. Cardio: regular rate and rhythm, S1S2 heard, no murmurs appreciated Pulm: clear to auscultation bilaterally, no wheezes, rhonchi or rales; normal work of breathing on room air Skin: Gumball sized epidermoid cyst noted just below the bra line in the mid back.  No evidence of infection or complication  Assessment/ Plan: 78 y.o. female   Controlled type 2 diabetes mellitus with other specified complication, without long-term current use of insulin (HCC) -  Plan: Bayer DCA Hb A1c Waived  Hypertension associated with diabetes (HCC)  Hyperlipidemia associated with type 2 diabetes mellitus (HCC)  Hypothyroidism due to acquired atrophy of thyroid  - Plan: TSH + free T4  Osteopenia of neck of left femur - Plan: VITAMIN D  25 Hydroxy (Vit-D Deficiency, Fractures)  Epidermoid cyst of skin of back  Diabetes remains controlled.  Check thyroid  levels.  Blood pressure well-controlled.  Continue all medications as prescribed for blood  pressure, cholesterol and thyroid .  Clinically euthymic but check thyroid  levels  Check vitamin D  given history of osteopenia.  DEXA scan planned today  Discussed if the epidermoid cyst becomes more bothersome and glad to refer her to Dr. Kallie for excision in [place].  She will contact me if this is needed  Norene CHRISTELLA Fielding, DO [facility] [phone]

## 2024-05-17 ENCOUNTER — Ambulatory Visit: Payer: Self-pay | Admitting: Family Medicine

## 2024-05-17 LAB — TSH+FREE T4
Free T4: 1.62 ng/dL (ref 0.82–1.77)
TSH: 0.431 u[IU]/mL — ABNORMAL LOW (ref 0.450–4.500)

## 2024-05-17 LAB — VITAMIN D 25 HYDROXY (VIT D DEFICIENCY, FRACTURES): Vit D, 25-Hydroxy: 42.9 ng/mL (ref 30.0–100.0)

## 2024-05-19 ENCOUNTER — Ambulatory Visit: Payer: Self-pay | Admitting: Family Medicine

## 2024-05-19 DIAGNOSIS — M8580 Other specified disorders of bone density and structure, unspecified site: Secondary | ICD-10-CM

## 2024-05-19 DIAGNOSIS — Z780 Asymptomatic menopausal state: Secondary | ICD-10-CM | POA: Diagnosis not present

## 2024-05-19 DIAGNOSIS — M85832 Other specified disorders of bone density and structure, left forearm: Secondary | ICD-10-CM | POA: Diagnosis not present

## 2024-06-02 MED ORDER — DENOSUMAB 60 MG/ML ~~LOC~~ SOSY
60.0000 mg | PREFILLED_SYRINGE | SUBCUTANEOUS | Status: DC
Start: 2024-06-02 — End: 2024-06-05

## 2024-06-05 ENCOUNTER — Telehealth: Payer: Self-pay

## 2024-06-05 ENCOUNTER — Other Ambulatory Visit (HOSPITAL_COMMUNITY): Payer: Self-pay

## 2024-06-05 DIAGNOSIS — M8580 Other specified disorders of bone density and structure, unspecified site: Secondary | ICD-10-CM

## 2024-06-05 MED ORDER — DENOSUMAB 60 MG/ML ~~LOC~~ SOSY: 60.0000 mg | PREFILLED_SYRINGE | Freq: Once | SUBCUTANEOUS | Status: DC

## 2024-06-05 NOTE — Progress Notes (Signed)
 Sent for benefit verification

## 2024-06-05 NOTE — Telephone Encounter (Signed)
 New Start - Prolia  sent for benefit verification

## 2024-06-05 NOTE — Telephone Encounter (Signed)
 PA for pharmacy benefit submitted via CMM. Key: [id number]

## 2024-06-05 NOTE — Telephone Encounter (Signed)
 Vanessa Hubbard

## 2024-06-05 NOTE — Telephone Encounter (Signed)
 Prolia VOB initiated via [email or web address]  Next Prolia inj DUE: NEW START

## 2024-06-06 ENCOUNTER — Other Ambulatory Visit (HOSPITAL_COMMUNITY): Payer: Self-pay

## 2024-06-06 NOTE — Telephone Encounter (Signed)
 Pt ready for scheduling for PROLIA  on or after : 06/06/24  Option# 1: Buy/Bill (Office supplied medication)  Out-of-pocket cost due at time of clinic visit: $332  Number of injection/visits approved: ---  Primary: BCBS[employer]-MEDICARE Prolia  co-insurance: 20% Admin fee co-insurance: 0%  Secondary: --- Prolia  co-insurance:  Admin fee co-insurance:   Medical Benefit Details: Date Benefits were checked: 06/05/24 Deductible: NO/ Coinsurance: 20%/ Admin Fee: 0%  Prior Auth: N/A PA# Expiration Date:   # of doses approved: ----------------------------------------------------------------------- Option# 2- Med Obtained from pharmacy:  Pharmacy benefit: Copay $297 (Paid to pharmacy) Admin Fee: 0% (Pay at clinic)  Prior Auth: APPROVED PA# [id number] Expiration Date: 06/05/24-06/05/25  # of doses approved: 2   If patient wants fill through the pharmacy benefit please send prescription to: [facility], and include estimated need by date in rx notes. Pharmacy will ship medication directly to the office.  Patient NOT eligible for Prolia  Copay Card. Copay Card can make patient's cost as little as $25. Link to apply: [email or web address]://[email or web address]  ** This summary of benefits is an estimation of the patient's out-of-pocket cost. Exact cost may very based on individual plan coverage.

## 2024-06-06 NOTE — Telephone Encounter (Signed)
 Copied from CRM [id number]. Topic: Clinical - Medication Prior Auth >> Jun 06, 2024  8:16 AM Antwanette L wrote: Reason for CRM: Miala from [facility] is calling because the pt prolia  has been approvide starting on 06/05/24 for a year. Maria can be contacted at [phone] opt 5

## 2024-06-07 ENCOUNTER — Telehealth: Payer: Self-pay

## 2024-06-07 NOTE — Telephone Encounter (Signed)
 Copied from CRM [id number]. Topic: Clinical - Lab/Test Results >> Jun 07, 2024  2:30 PM Rosaria E wrote: Reason for CRM: Pt's husband called requesting to discuss the pt's imaging results. Please advise  Best contact: [phone]  Speak to Meade Lesches he says

## 2024-06-07 NOTE — Telephone Encounter (Signed)
 Notified of results waiting on julie

## 2024-06-08 ENCOUNTER — Telehealth: Payer: Self-pay

## 2024-06-08 DIAGNOSIS — M8580 Other specified disorders of bone density and structure, unspecified site: Secondary | ICD-10-CM

## 2024-06-08 NOTE — Telephone Encounter (Signed)
 I left a message 06/06/24 and 06/08/24 for the patient to return my call.

## 2024-06-08 NOTE — Telephone Encounter (Signed)
 Copied from CRM [id number]. Topic: Clinical - Medication Question >> Jun 08, 2024  8:48 AM Carlatta H wrote: Reason for CRM: Nurse Melissa please call the patient regarding some mediation questions

## 2024-06-08 NOTE — Telephone Encounter (Signed)
 Patient states she no longer wants to do the prolia  injection.  She looked on her formulary and fosamax is not covered but alendronate sodium is.  Wanting to know if that can be prescribed?

## 2024-06-09 MED ORDER — ALENDRONATE SODIUM 70 MG PO TABS: 70.0000 mg | ORAL_TABLET | ORAL | 4 refills | Status: AC

## 2024-06-09 NOTE — Telephone Encounter (Signed)
 Patient aware and verbalizes understanding.

## 2024-06-09 NOTE — Addendum Note (Signed)
 Addended by: JOLINDA NORENE HERO on: 06/09/2024 07:27 AM   Modules accepted: Orders

## 2024-06-09 NOTE — Telephone Encounter (Signed)
 Yes, med sent  Meds ordered this encounter  Medications   alendronate (FOSAMAX) 70 MG tablet    Sig: Take 1 tablet (70 mg total) by mouth every 7 (seven) days. Take with a full glass of water on an empty stomach.    Dispense:  12 tablet    Refill:  4

## 2024-06-13 NOTE — Telephone Encounter (Signed)
 Patient wants to try Fosamax   - see phone note

## 2024-07-16 ENCOUNTER — Other Ambulatory Visit: Payer: Self-pay | Admitting: Family Medicine

## 2024-07-16 DIAGNOSIS — E034 Atrophy of thyroid (acquired): Secondary | ICD-10-CM

## 2024-07-31 ENCOUNTER — Ambulatory Visit: Admitting: Family Medicine

## 2024-07-31 ENCOUNTER — Encounter: Payer: Self-pay | Admitting: Family Medicine

## 2024-07-31 VITALS — BP 129/61 | HR 76 | Temp 97.5°F | Ht 61.0 in | Wt 171.4 lb

## 2024-07-31 DIAGNOSIS — Z23 Encounter for immunization: Secondary | ICD-10-CM | POA: Diagnosis not present

## 2024-07-31 DIAGNOSIS — L720 Epidermal cyst: Secondary | ICD-10-CM | POA: Diagnosis not present

## 2024-07-31 DIAGNOSIS — L729 Follicular cyst of the skin and subcutaneous tissue, unspecified: Secondary | ICD-10-CM | POA: Diagnosis not present

## 2024-07-31 DIAGNOSIS — L089 Local infection of the skin and subcutaneous tissue, unspecified: Secondary | ICD-10-CM | POA: Diagnosis not present

## 2024-07-31 MED ORDER — DOXYCYCLINE HYCLATE 100 MG PO TABS: 100.0000 mg | ORAL_TABLET | Freq: Two times a day (BID) | ORAL | 0 refills | Status: DC

## 2024-07-31 MED ORDER — FLUCONAZOLE 150 MG PO TABS: 150.0000 mg | ORAL_TABLET | Freq: Once | ORAL | 0 refills | Status: AC

## 2024-07-31 NOTE — Progress Notes (Signed)
 Subjective: RR:rbdu PCP: Jolinda Norene HERO, DO YEP:Honmpj Vanessa Hubbard is a 78 y.o. female presenting to clinic today for:  Cyst Patient has a back cyst that was previously diagnosed as epidermoid.  She notes that last week it actually got really red, inflamed and started draining purulent material.  She reports no fevers.  It is not stopped draining.  She has been trying not to mash on it.  Ready to go ahead and get it removed with general surgery   ROS: Per HPI  No Known Allergies Past Medical History:  Diagnosis Date   Broken ankle left   Diabetes mellitus without complication (HCC)    Fibroids    History of fractured kneecap    Hyperlipidemia    Hypertension    Osteopenia    Thyroid  disease     Current Outpatient Medications:    alendronate  (FOSAMAX ) 70 MG tablet, Take 1 tablet (70 mg total) by mouth every 7 (seven) days. Take with a full glass of water on an empty stomach., Disp: 12 tablet, Rfl: 4   aspirin  81 MG tablet, Take 81 mg by mouth 2 (two) times a week. , Disp: , Rfl:    azelastine  (ASTELIN ) 0.1 % nasal spray, Place 1 spray into both nostrils 2 (two) times daily., Disp: 30 mL, Rfl: 12   Vanessa-D ULTRA-FINE 33 LANCETS MISC, Use to check BG daily (Dx 11.9 type 2 DM), Disp: 100 each, Rfl: 1   cetirizine  (ZYRTEC ) 10 MG tablet, Take 0.5 tablets (5 mg total) by mouth daily as needed. Allergies., Disp: 90 tablet, Rfl: 1   Cholecalciferol  (VITAMIN D3) 2000 UNITS TABS, Take 1 tablet by mouth., Disp: , Rfl:    fluticasone  (FLONASE ) 50 MCG/ACT nasal spray, Place 2 sprays into both nostrils daily., Disp: 16 g, Rfl: 6   glucose blood (BAYER CONTOUR NEXT TEST) test strip, Use to check BG once a day. Dx 11.9 - type 2 diabetes, Disp: 50 each, Rfl: 2   hydrochlorothiazide  (HYDRODIURIL ) 25 MG tablet, Take 1 tablet (25 mg total) by mouth daily., Disp: 90 tablet, Rfl: 3   levothyroxine  (SYNTHROID ) 100 MCG tablet, TAKE 1 TABLET BY MOUTH ONCE DAILY BEFORE BREAKFAST, Disp: 90 tablet, Rfl: 1    Multiple Vitamin (MULTIVITAMIN) tablet, Take 1 tablet by mouth daily., Disp: , Rfl:    rosuvastatin  (CRESTOR ) 10 MG tablet, Take 1 tablet (10 mg total) by mouth daily., Disp: 90 tablet, Rfl: 3 Social History   Socioeconomic History   Marital status: Married    Spouse name: Ajiah Mcglinn    Number of children: 3   Years of education: GED   Highest education level: GED or equivalent  Occupational History   Occupation: Retired    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  Tobacco Use   Smoking status: Never   Smokeless tobacco: Never  Vaping Use   Vaping status: Never Used  Substance and Sexual Activity   Alcohol use: No   Drug use: No   Sexual activity: Not Currently  Other Topics Concern   Not on file  Social History Narrative   Lives with her husband in one level home   Social Drivers of Health   Financial Resource Strain: Low Risk  (03/20/2024)   Overall Financial Resource Strain (CARDIA)    Difficulty of Paying Living Expenses: Not hard at all  Food Insecurity: No Food Insecurity (03/20/2024)   Hunger Vital Sign    Worried About Running Out of Food in the Last  Year: Never true    Ran Out of Food in the Last Year: Never true  Transportation Needs: No Transportation Needs (03/20/2024)   PRAPARE - Administrator, Civil Service (Medical): No    Lack of Transportation (Non-Medical): No  Physical Activity: Insufficiently Active (03/20/2024)   Exercise Vital Sign    Days of Exercise per Week: 7 days    Minutes of Exercise per Session: 20 min  Stress: No Stress Concern Present (03/20/2024)   [employer] of Occupational Health - Occupational Stress Questionnaire    Feeling of Stress : Only a little  Social Connections: Socially Integrated (03/20/2024)   Social Connection and Isolation Panel    Frequency of Communication with Friends and Family: More than three times a week    Frequency of Social Gatherings with Friends and Family: More than three  times a week    Attends Religious Services: More than 4 times per year    Active Member of [employer] or Organizations: Yes    Attends Engineer, structural: More than 4 times per year    Marital Status: Married  Catering manager Violence: Not At Risk (03/20/2024)   Humiliation, Afraid, Rape, and Kick questionnaire    Fear of Current or Ex-Partner: No    Emotionally Abused: No    Physically Abused: No    Sexually Abused: No   Family History  Problem Relation Age of Onset   Jaundice Mother    Cancer Mother        breast   Breast cancer Mother    COPD Father    Heart disease Father        triple bypass   Heart failure Father    Diabetes Father    Thyroid  disease Daughter    Allergies Daughter    Alcohol abuse Son    Congenital heart disease Daughter     Objective: Office vital signs reviewed. BP 129/61   Pulse 76   Temp (!) 97.5 F (36.4 C)   Ht 5' 1 (1.549 m)   Wt 171 lb 6 oz (77.7 kg)   SpO2 94%   BMI 32.38 kg/m   Physical Examination:  General: Awake, alert, well nourished, No acute distress Skin: Epidermoid cyst with what appears to be 2 punctum 1 at the superior aspect of the cyst and 1 at the inferior, possible tunneling?  She has erythema, warmth but no active drainage.  There is palpable induration in this area    Assessment/ Plan: 78 y.o. female   Infected cyst of skin - Plan: doxycycline  (VIBRA -TABS) 100 MG tablet, fluconazole  (DIFLUCAN ) 150 MG tablet, Ambulatory referral to General Surgery  Epidermoid cyst of skin of back - Plan: doxycycline  (VIBRA -TABS) 100 MG tablet, Ambulatory referral to General Surgery   Start doxycycline  twice daily.  Diflucan  sent for as needed use.  Referral to general surgery for excision.  Discussed warm compresses.  We discussed red flag signs and symptoms warranting further evaluation and she voiced good understanding   Arran Fessel CHRISTELLA Fielding, DO [facility] [phone]

## 2024-08-10 ENCOUNTER — Ambulatory Visit: Admitting: General Surgery

## 2024-08-10 ENCOUNTER — Encounter: Payer: Self-pay | Admitting: General Surgery

## 2024-08-10 VITALS — BP 131/73 | HR 76 | Temp 98.1°F | Resp 14 | Ht 61.0 in | Wt 172.0 lb

## 2024-08-10 DIAGNOSIS — L720 Epidermal cyst: Secondary | ICD-10-CM | POA: Diagnosis not present

## 2024-08-10 MED ORDER — DOXYCYCLINE HYCLATE 100 MG PO CAPS: 100.0000 mg | ORAL_CAPSULE | Freq: Two times a day (BID) | ORAL | 0 refills | Status: AC

## 2024-08-10 NOTE — Patient Instructions (Signed)
 Call if the area worsens. Take the antibiotic. Will plan to remove the area in a few weeks in the office with numbing medication.

## 2024-08-10 NOTE — Progress Notes (Unsigned)
 [facility] History and Physical  Reason for Referral:*** Referring Physician: ***  Chief Complaint   New Patient (Initial Visit)     Vanessa Hubbard is a 78 y.o. female.  HPI:   Discussed the use of AI scribe software for clinical note transcription with the patient, who gave verbal consent to proceed.  History of Present Illness      ***.  The *** started *** and has had a duration of ***.  It is associated with ***.  The *** is improved with ***, and is made worse with ***.    Quality*** Context***  Past Medical History:  Diagnosis Date  . Broken ankle left  . Diabetes mellitus without complication (HCC)   . Fibroids   . History of fractured kneecap   . Hyperlipidemia   . Hypertension   . Osteopenia   . Thyroid  disease     Past Surgical History:  Procedure Laterality Date  . ABDOMINAL HYSTERECTOMY     complete   . ANKLE SURGERY    . BREAST BIOPSY Right 1987   benign  . BREAST LUMPECTOMY Right    benign  . CHOLECYSTECTOMY    . KNEE SURGERY Right   . ORIF PATELLA  01/22/2012   Procedure: OPEN REDUCTION INTERNAL (ORIF) FIXATION PATELLA;  Surgeon: Cordella Glendia Hutchinson, MD;  Location: [facility];  Service: Orthopedics;  Laterality: Left;  . TONSILLECTOMY      Family History  Problem Relation Age of Onset  . Jaundice Mother   . Cancer Mother        breast  . Breast cancer Mother   . COPD Father   . Heart disease Father        triple bypass  . Heart failure Father   . Diabetes Father   . Thyroid  disease Daughter   . Allergies Daughter   . Alcohol abuse Son   . Congenital heart disease Daughter     Social History   Tobacco Use  . Smoking status: Never  . Smokeless tobacco: Never  Vaping Use  . Vaping status: Never Used  Substance Use Topics  . Alcohol use: No  . Drug use: No    Medications: {medication reviewed/display:3041432} Allergies as of 08/10/2024   No Known Allergies      Medication List        Accurate as of  August 10, 2024  2:42 PM. If you have any questions, ask your nurse or doctor.          STOP taking these medications    doxycycline  100 MG tablet Commonly known as: VIBRA -TABS Stopped by: Manuelita JAYSON Pander       TAKE these medications    alendronate  70 MG tablet Commonly known as: FOSAMAX  Take 1 tablet (70 mg total) by mouth every 7 (seven) days. Take with a full glass of water on an empty stomach.   aspirin  81 MG tablet Take 81 mg by mouth 2 (two) times a week.   azelastine  0.1 % nasal spray Commonly known as: ASTELIN  Place 1 spray into both nostrils 2 (two) times daily.   B-D ULTRA-FINE 33 LANCETS Misc Use to check BG daily (Dx 11.9 type 2 DM)   cetirizine  10 MG tablet Commonly known as: ZYRTEC  Take 0.5 tablets (5 mg total) by mouth daily as needed. Allergies.   fluticasone  50 MCG/ACT nasal spray Commonly known as: FLONASE  Place 2 sprays into both nostrils daily.   glucose blood test strip Commonly known as: Banker  Next Test Use to check BG once a day. Dx 11.9 - type 2 diabetes   hydrochlorothiazide  25 MG tablet Commonly known as: HYDRODIURIL  Take 1 tablet (25 mg total) by mouth daily.   levothyroxine  100 MCG tablet Commonly known as: SYNTHROID  TAKE 1 TABLET BY MOUTH ONCE DAILY BEFORE BREAKFAST   multivitamin tablet Take 1 tablet by mouth daily.   rosuvastatin  10 MG tablet Commonly known as: CRESTOR  Take 1 tablet (10 mg total) by mouth daily.   Vitamin D3 50 MCG (2000 UT) Tabs Take 1 tablet by mouth.         ROS:  {Review of Systems:30496}  Blood pressure 131/73, pulse 76, temperature 98.1 F (36.7 C), temperature source Oral, resp. rate 14, height 5' 1 (1.549 m), weight 172 lb (78 kg), SpO2 96%. Physical Exam Physical Exam   Results: No results found for this or any previous visit (from the past 48 hours).  No results found.   Assessment and Plan: Assessment and Plan Assessment & Plan      Vanessa Hubbard is a 78  y.o. female with *** -*** -*** -Follow up ***   Future Appointments  Date Time Provider Department Center  08/21/2024 10:00 AM [facility] MOBILE MM 1 [facility] CE  09/21/2024 11:00 AM Kallie Manuelita BROCKS, MD [facility] None  11/24/2024  9:00 AM Jolinda Norene HERO, DO [facility] [address]  03/21/2025 12:30 PM [facility]NUAL WELLNESS VISIT [facility] [address]    All questions were answered to the satisfaction of the patient and family***.  The risk and benefits of *** were discussed including but not limited to ***.  After careful consideration, [name] has decided to ***.    Manuelita BROCKS Kallie 08/10/2024, 2:42 PM

## 2024-08-11 DIAGNOSIS — G8929 Other chronic pain: Secondary | ICD-10-CM | POA: Diagnosis not present

## 2024-08-21 ENCOUNTER — Ambulatory Visit
Admission: RE | Admit: 2024-08-21 | Discharge: 2024-08-21 | Disposition: A | Source: Ambulatory Visit | Attending: Family Medicine

## 2024-08-21 ENCOUNTER — Other Ambulatory Visit: Payer: Self-pay | Admitting: Family Medicine

## 2024-08-21 DIAGNOSIS — Z1231 Encounter for screening mammogram for malignant neoplasm of breast: Secondary | ICD-10-CM

## 2024-09-11 DIAGNOSIS — K080 Exfoliation of teeth due to systemic causes: Secondary | ICD-10-CM | POA: Diagnosis not present

## 2024-09-21 ENCOUNTER — Encounter: Payer: Self-pay | Admitting: General Surgery

## 2024-09-21 ENCOUNTER — Ambulatory Visit (INDEPENDENT_AMBULATORY_CARE_PROVIDER_SITE_OTHER): Admitting: General Surgery

## 2024-09-21 VITALS — BP 149/77 | HR 73 | Temp 98.0°F | Resp 16 | Ht 61.0 in | Wt 171.0 lb

## 2024-09-21 DIAGNOSIS — L720 Epidermal cyst: Secondary | ICD-10-CM

## 2024-09-21 HISTORY — PX: CYST REMOVAL TRUNK: SHX6283

## 2024-09-21 MED ORDER — OXYCODONE HCL 5 MG PO TABS: 5.0000 mg | ORAL_TABLET | ORAL | Status: DC | PRN

## 2024-09-21 NOTE — Patient Instructions (Signed)
 Discharge Instructions:  Common Complaints: Pain at the incision site is common.   Diet/ Activity: Diet as tolerated.  Birdbath until you remove the bandage on 09/23/2024. If you have bleeding under the bandage that is controlled with the gauze, leave the dressing in place, if more bleeding. Hold firm pressure on the wound for 10 minutes uninterrupted (knuckles turning white). This should stop any bleeding and replace the bandage.  Shower per your regular routine daily after 09/23/24.   Can place neosporin and gauze/ tape over area after you remove the initial bandage. Limit stretching, pulling on your incision if it is located on other parts of your body.    Medication: Take tylenol  and ibuprofen  as needed for pain control, alternating every 4-6 hours.  Example:  Tylenol  1000mg  @ 6am, 12noon, 6pm, (Do not exceed 4000mg  of tylenol  a day). Ibuprofen  800mg  @ 9am, 3pm, 9pm, 3am (Do not exceed 3600mg  of ibuprofen  a day).  Take Roxicodone  for breakthrough pain every 4 hours.  Take Colace for constipation related to narcotic pain medication. If you do not have a bowel movement in 2 days, take Miralax over the counter.  Drink plenty of water to also prevent constipation.   Contact Information: If you have questions or concerns, please call our office, [phone], Monday- Thursday 8AM-5PM and Friday 8AM-12Noon.  If it is after hours or on the weekend, please call [facility] Main Number, [phone], [phone] and ask to speak to the surgeon on call for Dr. Kallie at [facility].

## 2024-09-21 NOTE — Progress Notes (Unsigned)
 [facility] Procedure Note  09/21/24  Pre-procedure Diagnosis: ***   Post-procedure Diagnosis: Same***   Procedure(s) Performed: ***   Surgeon: Manuelita BROCKS. Kallie, MD   Assistants: ***No qualified resident was available    Anesthesia: Lidocaine  1%**    Specimens: ***   Estimated Blood Loss: Minimal***  Wound Class:***   Procedure Indications: ***  Findings:***   Procedure: The patient was taken to the procedure room and placed ***. The *** was prepared and draped in the usual sterile fashion. Lidocaine  1% was injected***.   ***  Final inspection revealed acceptable hemostasis. The patient tolerated the procedure well.   Future Appointments  Date Time Provider Department Center  10/03/2024 10:30 AM Kallie Manuelita BROCKS, MD [facility] None  11/24/2024  9:00 AM Jolinda Norene HERO, DO [facility] [address]  03/21/2025 12:30 PM [facility]ANNUAL FERDIE VISIT [facility] [address]     Manuelita Kallie, MD [facility] [address] [place], [place] [address] [phone] (office)

## 2024-09-25 LAB — PATHOLOGY REPORT

## 2024-09-25 LAB — TISSUE SPECIMEN

## 2024-10-03 ENCOUNTER — Encounter: Payer: Self-pay | Admitting: General Surgery

## 2024-10-03 ENCOUNTER — Ambulatory Visit (INDEPENDENT_AMBULATORY_CARE_PROVIDER_SITE_OTHER): Admitting: General Surgery

## 2024-10-03 VITALS — BP 138/77 | HR 73 | Temp 97.9°F | Resp 14 | Ht 61.0 in | Wt 171.0 lb

## 2024-10-03 DIAGNOSIS — L720 Epidermal cyst: Secondary | ICD-10-CM

## 2024-10-03 NOTE — Progress Notes (Signed)
 [facility]  Drainage from the site. Overall no major issues.  Pathology: Disrupted and inflamed epidermal inclusion cyst.   BP 138/77   Pulse 73   Temp 97.9 F (36.6 C) (Oral)   Resp 14   Ht 5' 1 (1.549 m)   Wt 171 lb (77.6 kg)   SpO2 95%   BMI 32.31 kg/m  Back incision healing, minor dehiscence superficially with minor drainage, no erythema, benzoine and steri strips place   Patient s/p cyst excision. Doing well. Minor dehiscence.   Steristrips will peel off in the next 5-7 days. You can remove them once they are peeling off. It is ok to shower. Pat the area dry.  Keep the bandaid on as needed for any further drainage. There is a minor opening of the wound (<1cm) that will close on its on with time. Call with issues.   Manuelita Pander, MD [facility] [address] [place], [place] [address] [phone] (office)

## 2024-10-03 NOTE — Patient Instructions (Signed)
 Steristrips will peel off in the next 5-7 days. You can remove them once they are peeling off. It is ok to shower. Pat the area dry.  Keep the bandaid on as needed for any further drainage. There is a minor opening of the wound (<1cm) that will close on its on with time. Call with issues.

## 2024-11-24 ENCOUNTER — Ambulatory Visit: Payer: Self-pay | Admitting: Family Medicine

## 2024-11-24 ENCOUNTER — Encounter: Payer: Self-pay | Admitting: Family Medicine

## 2024-11-24 VITALS — BP 126/75 | HR 76 | Temp 97.9°F | Ht 61.0 in | Wt 167.5 lb

## 2024-11-24 DIAGNOSIS — E034 Atrophy of thyroid (acquired): Secondary | ICD-10-CM | POA: Diagnosis not present

## 2024-11-24 DIAGNOSIS — E1159 Type 2 diabetes mellitus with other circulatory complications: Secondary | ICD-10-CM | POA: Diagnosis not present

## 2024-11-24 DIAGNOSIS — M8580 Other specified disorders of bone density and structure, unspecified site: Secondary | ICD-10-CM | POA: Diagnosis not present

## 2024-11-24 DIAGNOSIS — E119 Type 2 diabetes mellitus without complications: Secondary | ICD-10-CM

## 2024-11-24 DIAGNOSIS — E1169 Type 2 diabetes mellitus with other specified complication: Secondary | ICD-10-CM | POA: Diagnosis not present

## 2024-11-24 DIAGNOSIS — I152 Hypertension secondary to endocrine disorders: Secondary | ICD-10-CM

## 2024-11-24 DIAGNOSIS — Z0001 Encounter for general adult medical examination with abnormal findings: Secondary | ICD-10-CM | POA: Diagnosis not present

## 2024-11-24 DIAGNOSIS — E785 Hyperlipidemia, unspecified: Secondary | ICD-10-CM | POA: Diagnosis not present

## 2024-11-24 DIAGNOSIS — Z0000 Encounter for general adult medical examination without abnormal findings: Secondary | ICD-10-CM

## 2024-11-24 LAB — BAYER DCA HB A1C WAIVED: HB A1C (BAYER DCA - WAIVED): 6.9 % — ABNORMAL HIGH (ref 4.8–5.6)

## 2024-11-24 MED ORDER — ROSUVASTATIN CALCIUM 10 MG PO TABS: 10.0000 mg | ORAL_TABLET | Freq: Every day | ORAL | 3 refills | Status: AC

## 2024-11-24 MED ORDER — HYDROCHLOROTHIAZIDE 25 MG PO TABS: 25.0000 mg | ORAL_TABLET | Freq: Every day | ORAL | 3 refills | Status: AC

## 2024-11-24 MED ORDER — LEVOTHYROXINE SODIUM 100 MCG PO TABS: 100.0000 ug | ORAL_TABLET | Freq: Every day | ORAL | 1 refills | Status: AC

## 2024-11-24 NOTE — Progress Notes (Addendum)
 "  Vanessa Hubbard is a 79 y.o. female presents to office today for annual physical exam examination.     Type 2 Diabetes with hypertension, hyperlipidemia:  Diet controlled diabetes.  Compliant with her cholesterol and blood pressure medications.  Reports no visual disturbance.  Will schedule diabetic eye exam for next month.  Denies any chest pain, shortness of breath, sensory changes.  She does report occasional right sided hip pain and this was present over Christmas holiday when she was on her feet a lot.  She takes Tylenol  but sometimes she reports some diarrhea with maybe a bleeding hemorrhoid when that happens.  She reports a little rhinorrhea over the last week but is overall getting better.  Last eye exam: needs Last foot exam: needs Last A1c:  Lab Results  Component Value Date   HGBA1C 6.9 (H) 05/16/2024   Nephropathy screen indicated?: needs Last flu, zoster and/or pneumovax:  Immunization History  Administered Date(s) Administered   Fluad Quad(high Dose 65+) 07/21/2019, 07/30/2020, 08/06/2021, 09/29/2022   Fluad Trivalent(High Dose 65+) 09/13/2023   INFLUENZA, HIGH DOSE SEASONAL PF 09/08/2017, 09/05/2018, 07/31/2024   Influenza,inj,Quad PF,6+ Mos 09/08/2013, 08/30/2014, 08/27/2015, 09/02/2016   Moderna Sars-Covid-2 Vaccination 12/21/2019, 01/19/2020, 10/24/2020, 08/21/2021   Pneumococcal Conjugate-13 08/28/2015   Pneumococcal Polysaccharide-23 06/10/2011   Tdap 07/07/2011, 04/12/2023   Zoster, Live 01/03/2014    Occupation: Retired, Marital status: Married to Vanessa Hubbard, Substance use: None Health Maintenance Due  Topic Date Due   Diabetic kidney evaluation - eGFR measurement  11/11/2024   Diabetic kidney evaluation - Urine ACR  11/11/2024   FOOT EXAM  11/11/2024   HEMOGLOBIN A1C  11/16/2024    Immunization History  Administered Date(s) Administered   Fluad Quad(high Dose 65+) 07/21/2019, 07/30/2020, 08/06/2021, 09/29/2022   Fluad Trivalent(High Dose 65+) 09/13/2023    INFLUENZA, HIGH DOSE SEASONAL PF 09/08/2017, 09/05/2018, 07/31/2024   Influenza,inj,Quad PF,6+ Mos 09/08/2013, 08/30/2014, 08/27/2015, 09/02/2016   Moderna Sars-Covid-2 Vaccination 12/21/2019, 01/19/2020, 10/24/2020, 08/21/2021   Pneumococcal Conjugate-13 08/28/2015   Pneumococcal Polysaccharide-23 06/10/2011   Tdap 07/07/2011, 04/12/2023   Zoster, Live 01/03/2014   Past Medical History:  Diagnosis Date   Broken ankle left   Diabetes mellitus without complication (HCC)    Fibroids    History of fractured kneecap    Hyperlipidemia    Hypertension    Osteopenia    Thyroid  disease    Social History   Socioeconomic History   Marital status: Married    Spouse name: Vanessa Hubbard    Number of children: 3   Years of education: GED   Highest education level: GED or equivalent  Occupational History   Occupation: Retired    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  Tobacco Use   Smoking status: Never   Smokeless tobacco: Never  Vaping Use   Vaping status: Never Used  Substance and Sexual Activity   Alcohol use: No   Drug use: No   Sexual activity: Not Currently  Other Topics Concern   Not on file  Social History Narrative   Lives with her husband in one level home   Social Drivers of Health   Tobacco Use: Low Risk (11/24/2024)   Patient History    Smoking Tobacco Use: Never    Smokeless Tobacco Use: Never    Passive Exposure: Not on file  Financial Resource Strain: Low Risk (03/20/2024)   Overall Financial Resource Strain (CARDIA)    Difficulty of Paying Living Expenses: Not hard at all  Food  Insecurity: No Food Insecurity (03/20/2024)   Hunger Vital Sign    Worried About Running Out of Food in the Last Year: Never true    Ran Out of Food in the Last Year: Never true  Transportation Needs: No Transportation Needs (03/20/2024)   PRAPARE - Administrator, Civil Service (Medical): No    Lack of Transportation (Non-Medical): No  Physical  Activity: Insufficiently Active (03/20/2024)   Exercise Vital Sign    Days of Exercise per Week: 7 days    Minutes of Exercise per Session: 20 min  Stress: No Stress Concern Present (03/20/2024)   [employer] of Occupational Health - Occupational Stress Questionnaire    Feeling of Stress : Only a little  Social Connections: Socially Integrated (03/20/2024)   Social Connection and Isolation Panel    Frequency of Communication with Friends and Family: More than three times a week    Frequency of Social Gatherings with Friends and Family: More than three times a week    Attends Religious Services: More than 4 times per year    Active Member of Clubs or Organizations: Yes    Attends Banker Meetings: More than 4 times per year    Marital Status: Married  Catering Manager Violence: Not At Risk (03/20/2024)   Humiliation, Afraid, Rape, and Kick questionnaire    Fear of Current or Ex-Partner: No    Emotionally Abused: No    Physically Abused: No    Sexually Abused: No  Depression (PHQ2-9): Low Risk (11/24/2024)   Depression (PHQ2-9)    PHQ-2 Score: 1  Alcohol Screen: Low Risk (03/20/2024)   Alcohol Screen    Last Alcohol Screening Score (AUDIT): 0  Housing: Unknown (03/20/2024)   Housing Stability Vital Sign    Unable to Pay for Housing in the Last Year: No    Number of Times Moved in the Last Year: Not on file    Homeless in the Last Year: No  Utilities: Not At Risk (03/20/2024)   [employer] Utilities    Threatened with loss of utilities: No  Health Literacy: Adequate Health Literacy (03/20/2024)   B1300 Health Literacy    Frequency of need for help with medical instructions: Never   Past Surgical History:  Procedure Laterality Date   ABDOMINAL HYSTERECTOMY     complete    ANKLE SURGERY     BREAST BIOPSY Right 1987   benign   BREAST LUMPECTOMY Right    benign   CHOLECYSTECTOMY     CYST REMOVAL TRUNK N/A 09/21/2024   KNEE SURGERY Right    ORIF PATELLA  01/22/2012    Procedure: OPEN REDUCTION INTERNAL (ORIF) FIXATION PATELLA;  Surgeon: Vanessa Glendia Hutchinson, MD;  Location: [facility];  Service: Orthopedics;  Laterality: Left;   TONSILLECTOMY     Family History  Problem Relation Age of Onset   Jaundice Mother    Cancer Mother        breast   Breast cancer Mother    COPD Father    Heart disease Father        triple bypass   Heart failure Father    Diabetes Father    Thyroid  disease Daughter    Allergies Daughter    Alcohol abuse Son    Congenital heart disease Daughter    Current Medications[1]  Allergies[2]   ROS: Review of Systems A comprehensive review of systems was negative except for: Eyes: positive for contacts/glasses Ears, nose, mouth, throat, and face: positive for rhinorrhea  x1 week Gastrointestinal: positive for occasional bleeding hemorrhoid Integument/breast: positive for seb keratosis on back Musculoskeletal: positive for intermittent right hip pain    Physical exam BP 126/75   Pulse 76   Temp 97.9 F (36.6 C)   Ht 5' 1 (1.549 m)   Wt 167 lb 8 oz (76 kg)   SpO2 96%   BMI 31.65 kg/m  General appearance: alert, cooperative, appears stated age, no distress, and mildly obese Head: Normocephalic, without obvious abnormality, atraumatic Eyes: Sclera white.  PERRLA.  EOMI.  Wears glasses Ears: normal TM's and external ear canals both ears Nose: Nares normal. Septum midline. Mucosa normal. No drainage or sinus tenderness. Throat: lips, mucosa, and tongue normal; teeth and gums normal Neck: no adenopathy, no carotid bruit, supple, symmetrical, trachea midline, and thyroid  not enlarged, symmetric, no tenderness/mass/nodules Back: Increased kyphosis of thoracic spine with slight rotation to the right of the Thoracics Lungs: clear to auscultation bilaterally Heart: regular rate and rhythm, S1, S2 normal, no murmur, click, rub or gallop Abdomen: soft, non-tender; bowel sounds normal; no masses,  no organomegaly Extremities:  extremities normal, atraumatic, no cyanosis or edema and varicose veins noted Pulses: 2+ and symmetric Skin: Varicose veins as above.  She has a seborrheic keratosis on the right back near the bra line.  Well-healed scar in the mid thoracic area.  Multiple cherry angiomas Lymph nodes: No supraclavicular or anterior cervical lymphadenopathy Neurologic: Alert and oriented X 3, normal strength and tone. Normal symmetric reflexes. Normal coordination and gait      11/24/2024    9:05 AM 07/31/2024   12:01 PM 05/16/2024    9:20 AM  Depression screen PHQ 2/9  Decreased Interest 0 0 0  Down, Depressed, Hopeless 0 0 0  PHQ - 2 Score 0 0 0  Altered sleeping 1 1 1   Tired, decreased energy 0 0 0  Change in appetite 0 0 0  Feeling bad or failure about yourself  0 0 0  Trouble concentrating 0 0 0  Moving slowly or fidgety/restless 0 0 0  Suicidal thoughts 0 0 0  PHQ-9 Score 1 1  1    Difficult doing work/chores Not difficult at all Not difficult at all Not difficult at all     Data saved with a previous flowsheet row definition      11/24/2024    9:05 AM 07/31/2024   12:02 PM 05/16/2024    9:20 AM 11/12/2023    8:07 AM  GAD 7 : Generalized Anxiety Score  Nervous, Anxious, on Edge 0 0 0 0  Control/stop worrying 0 0 0 0  Worry too much - different things 0 0 0 0  Trouble relaxing 0 0 0 0  Restless 0 0 0 0  Easily annoyed or irritable 0 0 0 0  Afraid - awful might happen 0 0 0 0  Total GAD 7 Score 0 0 0 0  Anxiety Difficulty Not difficult at all Not difficult at all Not difficult at all     Recent Results (from the past 2160 hours)  Pathology Report     Status: None   Collection Time: 09/21/24 11:00 AM  Result Value Ref Range   Relevant History:      Comment: ICD-10: L72.0   Pathologist:      Comment: Romero DOROTHA Rav, M.D., Ph.D., FCAP Board Certification in Anatomic and Clinical Pathology and Cytopathology (Electronically Signed) Pathologist Release Date/Time: 09/25/2024 12:02PM     FINAL DIAGNOSIS:      Comment:  Significant atypia, a melanocytic lesion, or  malignancy is not identified in the current  biopsy.   TISSUE SPECIMEN     Status: None   Collection Time: 09/21/24 11:00 AM  Result Value Ref Range   A Source      Comment: Skin, back, biopsy:   A Gross Description      Comment: The specimen is received in 10% neutral buffered  formalin labeled with the patient's name, DOB,  and back  and consists of an ellipse of skin  measuring four 3.5 x 1.8 x 1.7 cm, and displays a  tan-brown smooth wrinkled crusted lesion  measuring 0.6 x 0.3 cm. The specimen is inked  red, sectioned, the tips submitted in A1 and the  rest of the specimen is submitted in cassette(s)  A2 through A9.  Specimen is totally submitted in  nine cassette(s), labeled A1-A9.    A Diagnosis      Comment: - Disrupted and inflamed epidermal inclusion  cyst. - See report notes.     Diabetic Foot Exam - Simple   Simple Foot Form Diabetic Foot exam was performed with the following findings: Yes   Visual Inspection No deformities, no ulcerations, no other skin breakdown bilaterally: Yes Sensation Testing Intact to touch and monofilament testing bilaterally: Yes Pulse Check Posterior Tibialis and Dorsalis pulse intact bilaterally: Yes Comments      Assessment/ Plan: Meade KATHEE Lesches here for annual physical exam.   Annual physical exam  Type 2 diabetes mellitus, controlled (HCC) - Plan: CMP14+EGFR, CBC with Differential, Bayer DCA Hb A1c Waived, Microalbumin / creatinine urine ratio  Hypertension associated with diabetes (HCC) - Plan: CMP14+EGFR, hydrochlorothiazide  (HYDRODIURIL ) 25 MG tablet  Hyperlipidemia associated with type 2 diabetes mellitus (HCC) - Plan: CMP14+EGFR, Lipid Panel, rosuvastatin  (CRESTOR ) 10 MG tablet  Hypothyroidism due to acquired atrophy of thyroid  - Plan: CMP14+EGFR, TSH + free T4, levothyroxine  (SYNTHROID ) 100 MCG tablet  Osteopenia with high risk of  fracture - Plan: CMP14+EGFR, VITAMIN D  25 Hydroxy (Vit-D Deficiency, Fractures)   Urine microalbumin, fasting labs collected A.  She will set diabetic eye exam up.  We did diabetic foot exam which was unremarkable.  Blood pressure well-controlled.  Continue current medication.  Written scripts for statin, HCTZ and Synthroid  given.  Check thyroid  levels, vitamin D  given known osteopenia.  She will contact me if she has any further issues with right hip pain and at which point we can consider referral to orthopedics for maybe hip injections as her symptoms are sometimes refractory to OTC management.  Counseled on healthy lifestyle choices, including diet (rich in fruits, vegetables and lean meats and low in salt and simple carbohydrates) and exercise (at least 30 minutes of moderate physical activity daily).  Patient to follow up 26m  Reuel Lamadrid M. Angalena Cousineau, DO        [1]  Current Outpatient Medications:    alendronate  (FOSAMAX ) 70 MG tablet, Take 1 tablet (70 mg total) by mouth every 7 (seven) days. Take with a full glass of water on an empty stomach., Disp: 12 tablet, Rfl: 4   aspirin  81 MG tablet, Take 81 mg by mouth 2 (two) times a week. , Disp: , Rfl:    azelastine  (ASTELIN ) 0.1 % nasal spray, Place 1 spray into both nostrils 2 (two) times daily., Disp: 30 mL, Rfl: 12   B-D ULTRA-FINE 33 LANCETS MISC, Use to check BG daily (Dx 11.9 type 2 DM), Disp: 100 each, Rfl: 1   cetirizine  (ZYRTEC ) 10 MG  tablet, Take 0.5 tablets (5 mg total) by mouth daily as needed. Allergies., Disp: 90 tablet, Rfl: 1   Cholecalciferol  (VITAMIN D3) 2000 UNITS TABS, Take 1 tablet by mouth., Disp: , Rfl:    fluticasone  (FLONASE ) 50 MCG/ACT nasal spray, Place 2 sprays into both nostrils daily., Disp: 16 g, Rfl: 6   glucose blood (BAYER CONTOUR NEXT TEST) test strip, Use to check BG once a day. Dx 11.9 - type 2 diabetes, Disp: 50 each, Rfl: 2   Multiple Vitamin (MULTIVITAMIN) tablet, Take 1 tablet by mouth daily.,  Disp: , Rfl:    hydrochlorothiazide  (HYDRODIURIL ) 25 MG tablet, Take 1 tablet (25 mg total) by mouth daily., Disp: 90 tablet, Rfl: 3   levothyroxine  (SYNTHROID ) 100 MCG tablet, Take 1 tablet (100 mcg total) by mouth daily before breakfast., Disp: 90 tablet, Rfl: 1   rosuvastatin  (CRESTOR ) 10 MG tablet, Take 1 tablet (10 mg total) by mouth daily., Disp: 90 tablet, Rfl: 3 [2] No Known Allergies  "

## 2024-11-25 LAB — CBC WITH DIFFERENTIAL/PLATELET
Basophils Absolute: 0.1 x10E3/uL (ref 0.0–0.2)
Basos: 1 %
EOS (ABSOLUTE): 0.1 x10E3/uL (ref 0.0–0.4)
Eos: 2 %
Hematocrit: 42.3 % (ref 34.0–46.6)
Hemoglobin: 13.8 g/dL (ref 11.1–15.9)
Immature Grans (Abs): 0.1 x10E3/uL (ref 0.0–0.1)
Immature Granulocytes: 1 %
Lymphocytes Absolute: 2 x10E3/uL (ref 0.7–3.1)
Lymphs: 27 %
MCH: 30.7 pg (ref 26.6–33.0)
MCHC: 32.6 g/dL (ref 31.5–35.7)
MCV: 94 fL (ref 79–97)
Monocytes Absolute: 0.5 x10E3/uL (ref 0.1–0.9)
Monocytes: 7 %
Neutrophils Absolute: 4.6 x10E3/uL (ref 1.4–7.0)
Neutrophils: 62 %
Platelets: 303 x10E3/uL (ref 150–450)
RBC: 4.49 x10E6/uL (ref 3.77–5.28)
RDW: 12.2 % (ref 11.7–15.4)
WBC: 7.4 x10E3/uL (ref 3.4–10.8)

## 2024-11-25 LAB — MICROALBUMIN / CREATININE URINE RATIO
Creatinine, Urine: 88.9 mg/dL
Microalb/Creat Ratio: 5 mg/g{creat} (ref 0–29)
Microalbumin, Urine: 4.4 ug/mL

## 2024-11-25 LAB — CMP14+EGFR
ALT: 15 IU/L (ref 0–32)
AST: 19 IU/L (ref 0–40)
Albumin: 4.2 g/dL (ref 3.8–4.8)
Alkaline Phosphatase: 82 IU/L (ref 49–135)
BUN/Creatinine Ratio: 13 (ref 12–28)
BUN: 9 mg/dL (ref 8–27)
Bilirubin Total: 1.2 mg/dL (ref 0.0–1.2)
CO2: 25 mmol/L (ref 20–29)
Calcium: 9.4 mg/dL (ref 8.7–10.3)
Chloride: 99 mmol/L (ref 96–106)
Creatinine, Ser: 0.69 mg/dL (ref 0.57–1.00)
Globulin, Total: 1.9 g/dL (ref 1.5–4.5)
Glucose: 162 mg/dL — ABNORMAL HIGH (ref 70–99)
Potassium: 3.9 mmol/L (ref 3.5–5.2)
Sodium: 139 mmol/L (ref 134–144)
Total Protein: 6.1 g/dL (ref 6.0–8.5)
eGFR: 88 mL/min/1.73

## 2024-11-25 LAB — TSH+FREE T4
Free T4: 1.71 ng/dL (ref 0.82–1.77)
TSH: 1.26 u[IU]/mL (ref 0.450–4.500)

## 2024-11-25 LAB — VITAMIN D 25 HYDROXY (VIT D DEFICIENCY, FRACTURES): Vit D, 25-Hydroxy: 39.8 ng/mL (ref 30.0–100.0)

## 2024-11-25 LAB — LIPID PANEL
Chol/HDL Ratio: 1.9 ratio (ref 0.0–4.4)
Cholesterol, Total: 111 mg/dL (ref 100–199)
HDL: 57 mg/dL
LDL Chol Calc (NIH): 32 mg/dL (ref 0–99)
Triglycerides: 127 mg/dL (ref 0–149)
VLDL Cholesterol Cal: 22 mg/dL (ref 5–40)

## 2024-12-13 ENCOUNTER — Other Ambulatory Visit: Payer: Self-pay | Admitting: Family Medicine

## 2024-12-13 DIAGNOSIS — J3489 Other specified disorders of nose and nasal sinuses: Secondary | ICD-10-CM

## 2025-05-25 ENCOUNTER — Ambulatory Visit: Admitting: Family Medicine

## 2025-11-26 ENCOUNTER — Encounter: Admitting: Family Medicine
